# Patient Record
Sex: Female | Born: 1960 | Race: White | Hispanic: No | Smoking: Former smoker
Health system: Southern US, Community
[De-identification: ages and names within clinical notes are randomized; demographics above are authoritative.]

## PROBLEM LIST (undated history)

## (undated) DIAGNOSIS — H409 Unspecified glaucoma: Secondary | ICD-10-CM

## (undated) DIAGNOSIS — E785 Hyperlipidemia, unspecified: Secondary | ICD-10-CM

## (undated) DIAGNOSIS — E119 Type 2 diabetes mellitus without complications: Secondary | ICD-10-CM

## (undated) DIAGNOSIS — I10 Essential (primary) hypertension: Secondary | ICD-10-CM

## (undated) DIAGNOSIS — T7840XA Allergy, unspecified, initial encounter: Secondary | ICD-10-CM

## (undated) DIAGNOSIS — M1990 Unspecified osteoarthritis, unspecified site: Secondary | ICD-10-CM

## (undated) DIAGNOSIS — R569 Unspecified convulsions: Secondary | ICD-10-CM

## (undated) DIAGNOSIS — G4733 Obstructive sleep apnea (adult) (pediatric): Secondary | ICD-10-CM

## (undated) DIAGNOSIS — R011 Cardiac murmur, unspecified: Secondary | ICD-10-CM

## (undated) DIAGNOSIS — D649 Anemia, unspecified: Secondary | ICD-10-CM

## (undated) DIAGNOSIS — E079 Disorder of thyroid, unspecified: Secondary | ICD-10-CM

## (undated) DIAGNOSIS — G709 Myoneural disorder, unspecified: Secondary | ICD-10-CM

## (undated) DIAGNOSIS — N189 Chronic kidney disease, unspecified: Secondary | ICD-10-CM

## (undated) DIAGNOSIS — J45909 Unspecified asthma, uncomplicated: Secondary | ICD-10-CM

## (undated) HISTORY — DX: Allergy, unspecified, initial encounter: T78.40XA

## (undated) HISTORY — DX: Myoneural disorder, unspecified: G70.9

## (undated) HISTORY — DX: Hyperlipidemia, unspecified: E78.5

## (undated) HISTORY — DX: Cardiac murmur, unspecified: R01.1

## (undated) HISTORY — DX: Essential (primary) hypertension: I10

## (undated) HISTORY — DX: Anemia, unspecified: D64.9

## (undated) HISTORY — DX: Unspecified asthma, uncomplicated: J45.909

## (undated) HISTORY — DX: Chronic kidney disease, unspecified: N18.9

## (undated) HISTORY — DX: Obstructive sleep apnea (adult) (pediatric): G47.33

## (undated) HISTORY — DX: Unspecified osteoarthritis, unspecified site: M19.90

## (undated) HISTORY — DX: Unspecified convulsions: R56.9

## (undated) HISTORY — DX: Disorder of thyroid, unspecified: E07.9

## (undated) HISTORY — DX: Type 2 diabetes mellitus without complications: E11.9

## (undated) HISTORY — PX: COLONOSCOPY: SHX174

## (undated) HISTORY — DX: Unspecified glaucoma: H40.9

---
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
## 1998-09-21 ENCOUNTER — Encounter: Admission: RE | Admit: 1998-09-21 | Discharge: 1998-12-20 | Payer: Self-pay | Admitting: Unknown Physician Specialty

## 2006-11-11 DIAGNOSIS — G4733 Obstructive sleep apnea (adult) (pediatric): Secondary | ICD-10-CM

## 2006-11-11 HISTORY — DX: Obstructive sleep apnea (adult) (pediatric): G47.33

## 2008-07-23 ENCOUNTER — Emergency Department (HOSPITAL_BASED_OUTPATIENT_CLINIC_OR_DEPARTMENT_OTHER): Admission: EM | Admit: 2008-07-23 | Discharge: 2008-07-23 | Payer: Self-pay | Admitting: Emergency Medicine

## 2009-12-15 ENCOUNTER — Encounter: Admission: RE | Admit: 2009-12-15 | Discharge: 2009-12-15 | Payer: Self-pay | Admitting: Family Medicine

## 2010-01-03 ENCOUNTER — Other Ambulatory Visit: Admission: RE | Admit: 2010-01-03 | Discharge: 2010-01-03 | Payer: Self-pay | Admitting: Interventional Radiology

## 2010-01-03 ENCOUNTER — Encounter: Admission: RE | Admit: 2010-01-03 | Discharge: 2010-01-03 | Payer: Self-pay | Admitting: Family Medicine

## 2010-09-10 ENCOUNTER — Ambulatory Visit: Payer: Self-pay | Admitting: Internal Medicine

## 2010-09-10 DIAGNOSIS — F17200 Nicotine dependence, unspecified, uncomplicated: Secondary | ICD-10-CM | POA: Insufficient documentation

## 2010-09-10 DIAGNOSIS — E785 Hyperlipidemia, unspecified: Secondary | ICD-10-CM | POA: Insufficient documentation

## 2010-09-10 DIAGNOSIS — E663 Overweight: Secondary | ICD-10-CM | POA: Insufficient documentation

## 2010-09-10 DIAGNOSIS — G4730 Sleep apnea, unspecified: Secondary | ICD-10-CM | POA: Insufficient documentation

## 2010-09-10 DIAGNOSIS — R002 Palpitations: Secondary | ICD-10-CM | POA: Insufficient documentation

## 2010-09-10 DIAGNOSIS — R0609 Other forms of dyspnea: Secondary | ICD-10-CM | POA: Insufficient documentation

## 2010-09-10 DIAGNOSIS — R0989 Other specified symptoms and signs involving the circulatory and respiratory systems: Secondary | ICD-10-CM | POA: Insufficient documentation

## 2010-09-10 DIAGNOSIS — I10 Essential (primary) hypertension: Secondary | ICD-10-CM | POA: Insufficient documentation

## 2010-09-25 ENCOUNTER — Ambulatory Visit (HOSPITAL_COMMUNITY): Admission: RE | Admit: 2010-09-25 | Discharge: 2010-09-25 | Payer: Self-pay | Admitting: Internal Medicine

## 2010-09-25 ENCOUNTER — Telehealth (INDEPENDENT_AMBULATORY_CARE_PROVIDER_SITE_OTHER): Payer: Self-pay | Admitting: Radiology

## 2010-09-26 ENCOUNTER — Encounter: Payer: Self-pay | Admitting: Cardiovascular Disease

## 2010-09-26 ENCOUNTER — Ambulatory Visit: Payer: Self-pay | Admitting: Cardiovascular Disease

## 2010-09-26 ENCOUNTER — Ambulatory Visit (HOSPITAL_COMMUNITY): Admission: RE | Admit: 2010-09-26 | Discharge: 2010-09-26 | Payer: Self-pay | Admitting: Internal Medicine

## 2010-09-26 ENCOUNTER — Ambulatory Visit: Payer: Self-pay

## 2010-09-26 ENCOUNTER — Encounter (HOSPITAL_COMMUNITY)
Admission: RE | Admit: 2010-09-26 | Discharge: 2010-11-10 | Payer: Self-pay | Source: Home / Self Care | Attending: Internal Medicine | Admitting: Internal Medicine

## 2010-10-01 ENCOUNTER — Ambulatory Visit: Payer: Self-pay

## 2010-12-11 NOTE — Assessment & Plan Note (Signed)
Summary: Cardiology Nuclear Testing  Nuclear Med Background Indications for Stress Test: Evaluation for Ischemia   History: Asthma   Symptoms: Palpitations    Nuclear Pre-Procedure Cardiac Risk Factors: Family History - CAD, Hypertension, Lipids, NIDDM, Smoker Caffeine/Decaff Intake: None NPO After: 2:30 AM IV 0.9% NS with Angio Cath: 22g     IV Site: L Antecubital IV Started by: Irean Hong, RN Chest Size (in) 42     Cup Size C     Height (in): 63 Weight (lb): 235 BMI: 41.78  Nuclear Med Study 1 or 2 day study:  2 day     Stress Test Type:  Stress Reading MD:  Charlton Haws, MD     Referring MD:  P.Ross Resting Radionuclide:  Technetium 41m Tetrofosmin     Resting Radionuclide Dose:  33 mCi  Stress Radionuclide:  Technetium 34m Tetrofosmin     Stress Radionuclide Dose:  32 mCi   Stress Protocol Exercise Time (min):  4:00 min     Max HR:  157 bpm     Predicted Max HR:  169 bpm  Max Systolic BP: 190 mm Hg     Percent Max HR:  92.90 %     METS: 5.8 Rate Pressure Product:  81191    Stress Test Technologist:  Milana Na, EMT-P     Nuclear Technologist:  Doyne Keel, CNMT  Rest Procedure  Myocardial perfusion imaging was performed at rest 45 minutes following the intravenous administration of Technetium 68m Tetrofosmin.  Stress Procedure  The patient exercised for 4:00. The patient stopped due to fatigue and denied any chest pain.  There were no significant ST-T wave changes.  Technetium 31m Tetrofosmin was injected at peak exercise and myocardial perfusion imaging was performed after a brief delay.  QPS Raw Data Images:  Normal; no motion artifact; normal heart/lung ratio. Stress Images:  Normal homogeneous uptake in all areas of the myocardium. Rest Images:  Normal homogeneous uptake in all areas of the myocardium. Subtraction (SDS):  Normal Transient Ischemic Dilatation:  1.07  (Normal <1.22)  Lung/Heart Ratio:  0.42  (Normal <0.45)  Quantitative Gated Spect  Images QGS EDV:  66 ml QGS ESV:  21 ml QGS EF:  68 % QGS cine images:  normal  Findings Normal nuclear study      Overall Impression  Exercise Capacity: Poor exercise capacity. BP Response: Hypertensive blood pressure response. Clinical Symptoms: No chest pain ECG Impression: No significant ST segment change suggestive of ischemia. Overall Impression: Normal stress nuclear study.  Appended Document: Cardiology Nuclear Testing Normal stress nuclear study.  No evid to support coronary artery ischemia.  Appended Document: Cardiology Nuclear Testing [facility] with results.  Appended Document: Cardiology Nuclear Testing [facility] with results (private voice mail at work).

## 2010-12-11 NOTE — Assessment & Plan Note (Signed)
Summary: np6/palp-mb   Visit Type:  new pt Primary Provider:  Dr. Nadyne Coombes  CC:  swelling in legs.  History of Present Illness: patient is a 50 year old who was referred for evaluation of palpitatons. The patinet wa seen by Dr. Hal Hope in August.   A couple wks before this visit she notes the onset of palpittations.  She describes it as a "frog in her chest"   She has listened to her heart and says she hears it stop.  Events would occur randomly.  Not associated with dizziness.  Improved but hten last week she noted they came on again the patient is fairly active.  Walds dog a few times per week.  Noted a cough earlier in fall   Attrib to allergies.  With ithis would note occasional "pinch" in chest the was not assoc with actiity.  Come/go.  Not much now.  Current Medications (verified): 1)  Metformin Hcl 1000 Mg Tabs (Metformin Hcl) .... Two Times A Day 2)  Glucotrol 10 Mg Tabs (Glipizide) .... Two Times A Day 3)  Lisinopril 40 Mg Tabs (Lisinopril) .... Once Daily 4)  Pravastatin Sodium 40 Mg Tabs (Pravastatin Sodium) .... 2 Tab,  Two Times A Day 5)  Zyrtec Hives Relief 10 Mg Tabs (Cetirizine Hcl) .... Once Daily 6)  Multivitamins  Tabs (Multiple Vitamin) .... Once Daily 7)  Aspirin 81 Mg Tbec (Aspirin) .... Take One Tablet By Mouth Daily 8)  B Complex Formula 1  Tabs (Vitamins-Lipotropics) .... Once Daily  Allergies (verified): No Known Drug Allergies  Past History:  Past Medical History:  Asthma Arthritis Depression Diabetes Type 2  since 1996 Hypertension Dyslipidemia.  Past Surgical History:  cholecystectomy   Gall Bladder Removal  Family History: Reviewed history and no changes required. Motherr: Ovarian Cancer  Father:  CAD, CABG, AAA. Congestive Heart Failure  2 bro (twins):  One with hx MI in 57s. DIed of CA.  Other died of CHF. Nephew:  HTN, died of ruptured aorta.  Social History: Reviewed history from 08/22/2010 and no changes  required. non-drinker, No drugs 15 pyr.  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  Notes glucoses have been high.  Otherwise neg to the above problem excetp as noted above.  Vital Signs:  Patient profile:   50 year old female Height:      63 inches Weight:      241.50 pounds BMI:     42.93 Pulse rate:   92 / minute BP sitting:   128 / 60  (left arm)  Vitals Entered By: Caralee Ates CMA (September 10, 2010 8:50 AM)  Physical Exam  Additional Exam:  Patient is in NAD HEENT:  Normocephalic, atraumatic. EOMI, PERRLA.  Neck: JVP is normal. No thyromegaly. No bruits.  Lungs: clear to auscultation. No rales no wheezes.  Heart: Regular rate and rhythm. Normal S1, S2. No S3.   No significant murmurs. PMI not displaced.  Abdomen:  Supple, nontender. Normal bowel sounds. No masses. No hepatomegaly.  Extremities:   Good distal pulses throughout. No lower extremity edema.  Musculoskeletal :moving all extremities.  Neuro:   alert and oriented x3.    EKG  Procedure date:  09/10/2010  Findings:      NSR.  86 bpm.  Impression & Recommendations:  Problem # 1:  PALPITATIONS (ICD-785.1) Patients spells sound like isolated skips.  Not hemodyn destabilizing.  Actually improved.  I would follow.  Wiht history though (strong) of CAD, CHF would rec echo and myoview (esp given pts own risks) to evaluate further.  Problem # 2:  HYPERLIPIDEMIA-MIXED (ICD-272.4) Will get results  On unusual regimen.   Her updated medication list for this problem includes:    Pravastatin Sodium 40 Mg Tabs (Pravastatin sodium) .Marland Kitchen... 2 tab,  two times a day  Problem # 3:  HYPERTENSION, BENIGN (ICD-401.1) Adequate control Her updated medication list for this problem includes:    Lisinopril 40 Mg Tabs (Lisinopril) ..... Once daily    Aspirin 81 Mg Tbec (Aspirin) .Marland Kitchen... Take one tablet by mouth daily  Problem # 4:  OVERWEIGHT/OBESITY  (ICD-278.02) Counselled on exercise, wt loss.    Problem # 5:  SLEEP APNEA (ICD-780.57) If she indeed has I should be treated.  Problem # 6:  TOBACCO ABUSE (ICD-305.1) Counselled.  patient is trying to quit.  Other Orders: Echocardiogram (Echo) Nuclear Stress Test (Nuc Stress Test)  Patient Instructions: 1)  Your physician has requested that you have an echocardiogram.  Echocardiography is a painless test that uses sound waves to create images of your heart. It provides your doctor with information about the size and shape of your heart and how well your heart's chambers and valves are working.  This procedure takes approximately one hour. There are no restrictions for this procedure. 2)  Your physician has requested that you have an exercise stress myoview.  For further information please visit [email or web address]  Please follow instruction sheet, as given. 3)  Your physician recommends that you schedule a follow-up appointment as needed with Dr Tenny Craw.    Appended Document: np6/palp-mb Pt with history of thyroid nodules.  ON exam  thyroid is prominent.  No palp nodules.  She reprts thyroid values were normal earlier this year.

## 2010-12-11 NOTE — Progress Notes (Signed)
Summary: nu8c pre-procedure  Phone Note Outgoing Call   Call placed by: Domenic Polite, CNMT,  September 25, 2010 12:06 PM Call placed to: Patient Reason for Call: Confirm/change Appt Summary of Call: Left message with information on Myoview Information Sheet (see scanned document for details).  Initial call taken by: Domenic Polite, CNMT,  September 25, 2010 12:06 PM     Nuclear Med Background Indications for Stress Test: Evaluation for Ischemia   History: Asthma   Symptoms: Palpitations    Nuclear Pre-Procedure Cardiac Risk Factors: Family History - CAD, Hypertension, Lipids, NIDDM, Smoker Height (in): 63

## 2011-11-18 ENCOUNTER — Ambulatory Visit: Payer: Self-pay | Admitting: Family Medicine

## 2011-11-25 ENCOUNTER — Ambulatory Visit (INDEPENDENT_AMBULATORY_CARE_PROVIDER_SITE_OTHER): Payer: BC Managed Care – PPO | Admitting: Family Medicine

## 2011-11-25 DIAGNOSIS — E785 Hyperlipidemia, unspecified: Secondary | ICD-10-CM

## 2011-11-25 DIAGNOSIS — Z23 Encounter for immunization: Secondary | ICD-10-CM

## 2011-11-25 DIAGNOSIS — I10 Essential (primary) hypertension: Secondary | ICD-10-CM

## 2011-11-25 DIAGNOSIS — E119 Type 2 diabetes mellitus without complications: Secondary | ICD-10-CM

## 2011-11-26 ENCOUNTER — Encounter: Payer: Self-pay | Admitting: Family Medicine

## 2011-11-26 DIAGNOSIS — M109 Gout, unspecified: Secondary | ICD-10-CM | POA: Insufficient documentation

## 2011-11-26 DIAGNOSIS — IMO0001 Reserved for inherently not codable concepts without codable children: Secondary | ICD-10-CM

## 2011-11-26 DIAGNOSIS — E042 Nontoxic multinodular goiter: Secondary | ICD-10-CM | POA: Insufficient documentation

## 2011-11-26 DIAGNOSIS — E785 Hyperlipidemia, unspecified: Secondary | ICD-10-CM

## 2012-12-03 ENCOUNTER — Telehealth: Payer: Self-pay | Admitting: Radiology

## 2012-12-03 NOTE — Telephone Encounter (Signed)
Lantus 50 units qd, losartin, metformin and crestor rx requested from [facility]. Patient has 1 month supply she picked up today, they are requesting refills, advised patient due for follow up. [facility] advised they will call her to advise her of need for office visit.

## 2012-12-31 ENCOUNTER — Ambulatory Visit (INDEPENDENT_AMBULATORY_CARE_PROVIDER_SITE_OTHER): Payer: BC Managed Care – PPO | Admitting: Family Medicine

## 2012-12-31 ENCOUNTER — Encounter: Payer: Self-pay | Admitting: Family Medicine

## 2012-12-31 VITALS — BP 131/80 | HR 102 | Temp 98.9°F | Resp 17 | Ht 62.0 in | Wt 224.0 lb

## 2012-12-31 DIAGNOSIS — M25519 Pain in unspecified shoulder: Secondary | ICD-10-CM

## 2012-12-31 DIAGNOSIS — E1149 Type 2 diabetes mellitus with other diabetic neurological complication: Secondary | ICD-10-CM

## 2012-12-31 DIAGNOSIS — I10 Essential (primary) hypertension: Secondary | ICD-10-CM

## 2012-12-31 DIAGNOSIS — E042 Nontoxic multinodular goiter: Secondary | ICD-10-CM

## 2012-12-31 LAB — COMPREHENSIVE METABOLIC PANEL
BUN: 13 mg/dL (ref 6–23)
CO2: 26 mEq/L (ref 19–32)
Creat: 0.84 mg/dL (ref 0.50–1.10)
Potassium: 4.2 mEq/L (ref 3.5–5.3)
Sodium: 139 mEq/L (ref 135–145)

## 2012-12-31 LAB — LIPID PANEL
HDL: 29 mg/dL — ABNORMAL LOW (ref >39)
LDL Cholesterol: 53 mg/dL (ref 0–99)
Total CHOL/HDL Ratio: 3.6 Ratio

## 2012-12-31 MED ORDER — DICLOFENAC SODIUM 75 MG PO TBEC: 75.0000 mg | DELAYED_RELEASE_TABLET | Freq: Two times a day (BID) | ORAL | Status: DC

## 2012-12-31 MED ORDER — INSULIN LISPRO PROT & LISPRO (50-50 MIX) 100 UNIT/ML ~~LOC~~ SUSP: SUBCUTANEOUS | Status: DC

## 2012-12-31 MED ORDER — LOSARTAN POTASSIUM 100 MG PO TABS: 100.0000 mg | ORAL_TABLET | Freq: Every day | ORAL | Status: DC

## 2012-12-31 MED ORDER — METFORMIN HCL 1000 MG PO TABS: 1000.0000 mg | ORAL_TABLET | Freq: Two times a day (BID) | ORAL | Status: DC

## 2012-12-31 MED ORDER — ROSUVASTATIN CALCIUM 10 MG PO TABS: 10.0000 mg | ORAL_TABLET | Freq: Every day | ORAL | Status: DC

## 2012-12-31 MED ORDER — INSULIN GLARGINE 100 UNIT/ML ~~LOC~~ SOLN: 50.0000 [IU] | Freq: Two times a day (BID) | SUBCUTANEOUS | Status: DC

## 2012-12-31 NOTE — Patient Instructions (Addendum)
For joint pain, try Arnica gel which you can apply 2-3 times a day to unbroken dry skin.

## 2012-12-31 NOTE — Progress Notes (Signed)
S:  Madison 52 y.o. Cauc Cowan was last here in Jan 2013. She has Type II DM (A1c= 11.2%) w/ mild peripheral neuropathy, HTN and obesity (Metabolic Syndrome). FSBS checked 2-3 times/week; average values mid-100s. Denies symptoms of hypoglycemia. Not following a meal plan due to hectic work schedule (w/ [employer] program) and long hours; often skips meals. No regular exercise but did walk dogs for 15 minutes x 2 yesterday. Has problems w/ sciatica and does not do stretching exercises.  Ophthalmology exam yearly.  Re: HTN- well controlled on current meds; pt is compliant and w/o adverse effects.   Pt c/o L shoulder pain; she has hx of L elbow tendinitis but now has pain around shoulder w/ restricted ROM and pain along back of arm. Madison occurs with certain movements ( reaching in front or up above shoulder level).  ROS: As per HPI; negative for fatigue, neck pain or stiffness, CP or tightness, palpitations, SOB or DOE, cough, HA, dizziness, numbness, weakness or syncope. She denies any psychological problems.  O:  Filed Vitals:   12/31/12 1524  BP: 131/80  Pulse: 102  Temp: 98.9 F (37.2 C)  Resp: 17   GEN: In NAD; WN,WD. Pt is obese. HENT: [place]/AT; EOMI w/ clear conj/ sclerae. EACs normal. Oroph clear and moist. NECK: Supple; nontender. No LAN. Enlarged thyroid with palpable nodule on R. COR: RRR. LUNGS: CTA; poor inspiratory effort. No wheezes or rales. MS: L shoulder w/ decreased ROM in anterior plane and abduction above 90 degrees. Tender supraspinatous muscle. EXT: Foot exam- normal skin with intact TD/DP pulses- 1+. No ulcers. Sensations diminished. NEURO: A&O x 3; CNs intact. DTRs  0-1+/= in UEs and LEs. Nonfocal.   Results for orders placed in visit on 12/31/12  POCT GLYCOSYLATED HEMOGLOBIN (HGB A1C)      Result Value Range   Hemoglobin A1C 9.2      A/P:  DM (diabetes mellitus) type II controlled, neurological manifestation -  Start to follow a meal plan and avoid skipping meals.   Plan: Refills medications, Amb ref to Medical Nutrition Therapy-MNT. CMET and Lipids.  Multiple thyroid nodules - stable.          Plan: TSH, T3, Free  HTN (hypertension) - well controlled and stable.      Plan: CMET.  Severe obesity (BMI >= 40) - pt to increase activity and improve nutrition Plan: TSH, Amb ref to Medical Nutrition Therapy-MNT  Pain in joint, shoulder region- suspect overuse syndrome.  RX: Diclofenac, Topical analgesic.   Meds ordered Madison encounter  Medications  . DISCONTD: losartan (COZAAR) 100 MG tablet    Sig: Take 100 mg by mouth daily.  Marland Kitchen DISCONTD: insulin lispro protamine-insulin lispro (HUMALOG MIX 50/50 PEN) (50-50) 100 UNIT/ML SUSP    Sig: Inject 10 Units into the skin 2 (two) times daily before a meal. Pre meal  . albuterol (PROVENTIL HFA;VENTOLIN HFA) 108 (90 BASE) MCG/ACT inhaler    Sig: Inhale 2 puffs into the lungs every 6 (six) hours as needed for wheezing.  . Multiple Vitamins-Minerals (MULTIVITAMIN WITH MINERALS) tablet    Sig: Take 1 tablet by mouth daily.  Marland Kitchen b complex vitamins tablet    Sig: Take 1 tablet by mouth daily.  . Ascorbic Acid (VITAMIN C) 100 MG tablet    Sig: Take 100 mg by mouth daily.  . calcium-vitamin D (OSCAL WITH D) 500-200 MG-UNIT per tablet    Sig: Take 1 tablet by mouth daily.  Marland Kitchen co-enzyme Q-10 30 MG capsule  Sig: Take 30 mg by mouth 3 (three) times daily.  . diclofenac (VOLTAREN) 75 MG EC tablet    Sig: Take 1 tablet (75 mg total) by mouth 2 (two) times daily. Take all doses with food or snack.    Dispense:  60 tablet    Refill:  1  . rosuvastatin (CRESTOR) 10 MG tablet    Sig: Take 1 tablet (10 mg total) by mouth daily.    Dispense:  90 tablet    Refill:  3  . metFORMIN (GLUCOPHAGE) 1000 MG tablet    Sig: Take 1 tablet (1,000 mg total) by mouth 2 (two) times daily.    Dispense:  180 tablet    Refill:  3  . losartan (COZAAR) 100 MG tablet    Sig: Take 1 tablet (100 mg total) by mouth daily.    Dispense:  90 tablet     Refill:  3  . insulin lispro protamine-insulin lispro (HUMALOG MIX 50/50 PEN) (50-50) 100 UNIT/ML SUSP    Sig: Inject 10 units under skin immediately before main meals.    Dispense:  10 mL    Refill:  5  . insulin glargine (LANTUS) 100 UNIT/ML injection    Sig: Inject 50 Units into the skin 2 (two) times daily.    Dispense:  3 pen    Refill:  5

## 2013-01-01 LAB — TSH: TSH: 1.318 u[IU]/mL (ref 0.350–4.500)

## 2013-01-02 ENCOUNTER — Encounter: Payer: Self-pay | Admitting: Family Medicine

## 2013-01-27 ENCOUNTER — Encounter: Payer: Self-pay | Admitting: Family Medicine

## 2013-02-02 ENCOUNTER — Ambulatory Visit: Payer: Self-pay | Admitting: *Deleted

## 2013-02-08 ENCOUNTER — Other Ambulatory Visit: Payer: Self-pay | Admitting: *Deleted

## 2013-02-08 MED ORDER — INSULIN LISPRO 100 UNIT/ML ~~LOC~~ SOLN: SUBCUTANEOUS | Status: DC

## 2013-04-01 ENCOUNTER — Ambulatory Visit: Payer: BC Managed Care – PPO | Admitting: Family Medicine

## 2013-04-09 ENCOUNTER — Other Ambulatory Visit: Payer: Self-pay | Admitting: Family Medicine

## 2013-04-28 ENCOUNTER — Ambulatory Visit (INDEPENDENT_AMBULATORY_CARE_PROVIDER_SITE_OTHER): Payer: BC Managed Care – PPO | Admitting: Family Medicine

## 2013-04-28 ENCOUNTER — Encounter: Payer: Self-pay | Admitting: Family Medicine

## 2013-04-28 VITALS — BP 145/83 | HR 101 | Temp 98.3°F | Resp 18 | Ht 62.75 in | Wt 218.0 lb

## 2013-04-28 DIAGNOSIS — E669 Obesity, unspecified: Secondary | ICD-10-CM

## 2013-04-28 DIAGNOSIS — E786 Lipoprotein deficiency: Secondary | ICD-10-CM

## 2013-04-28 DIAGNOSIS — I10 Essential (primary) hypertension: Secondary | ICD-10-CM

## 2013-04-28 DIAGNOSIS — IMO0001 Reserved for inherently not codable concepts without codable children: Secondary | ICD-10-CM

## 2013-04-28 MED ORDER — INSULIN SYRINGES (DISPOSABLE) U-100 1 ML MISC: 10.0000 [IU] | Freq: Three times a day (TID) | Status: DC

## 2013-04-28 MED ORDER — INSULIN ASPART 100 UNIT/ML ~~LOC~~ SOLN: 10.0000 [IU] | Freq: Three times a day (TID) | SUBCUTANEOUS | Status: DC

## 2013-04-28 NOTE — Progress Notes (Signed)
S:  This 52 y.o. Cauc female has Type II DM and HTN; she is compliant w/ medications and reports no hypoglycemic episodes. She does not check FSBS as often as she should but, now that school is out foe the summer, her daily routine will be more consistent. Nutrition has suffered due to long work hours and she admits poor food choices. Weight is down 6 pounds and pt will be more active during the summer months.  Insurance formulary is changing so current Humalog Insulin in the [place] will no longer be available.   PMHx, Soc Hx and Problem List reviewed.  ROS: Negative for diaphoresis, fever/chills, abnormal weight or appetite change, vision changes, CP or tightness, palpitations, edema, SOB or DOE, cough, GI problems, polydipsia or polyphagia, myalgias, HA, dizziness, numbness, weakness  or syncope.  OCeasar Mons Vitals:   04/28/13 0950  BP: 145/83                   Weight down 6 pounds since Feb 2014.         BP @ 10:20 AM: 140/86  Pulse: 101  Temp: 98.3 F (36.8 C)  Resp: 18   GEN: In NAD; WN,WD. HENT: [place]/AT; wears corrective lenses. EOMI w/ clear conj/sclerae. Otherwise unremarkable. COR: RRR. LUNGS: Normal resp rate and effort. SKIN: W&D; see DM foot exam. MS: MAEs; no joint deformities or decreased ROM. NEURO: A&O x 3; CNs intact. Nonfocal.  Results for orders placed in visit on 04/28/13  GLUCOSE, POCT (MANUAL RESULT ENTRY)      Result Value Range   POC Glucose 220 (*) 70 - 99 mg/dl  POCT GLYCOSYLATED HEMOGLOBIN (HGB A1C)      Result Value Range   Hemoglobin A1C 8.8     A/P: Type II or unspecified type diabetes mellitus without mention of complication, uncontrolled - Plan: POCT glucose (manual entry), POCT glycosylated hemoglobin (Hb A1C)  Continue Lantus 50 units bid. RX: Novolog 10-15 units subq tid before meals. Focus on portion control and regular physical activity for weight loss. Check FSBS 2-3 times daily.  HTN, goal below 140/80- stable on current medication; weight  reduction encouraged as well as home monitoring.  Obesity, unspecified  Low HDL (under 40)- will recheck labs at next visit.

## 2013-04-28 NOTE — Patient Instructions (Addendum)
I have changed your Insulin to Novolog; the dose is 10-15 units injected before meals, three times a day.  Try to stay active this summer and make healthy food choices and monitor portion sizes. I will see you again in ~ 4 months. Labs will be repeated at that visit.

## 2013-05-13 ENCOUNTER — Ambulatory Visit (INDEPENDENT_AMBULATORY_CARE_PROVIDER_SITE_OTHER): Payer: BC Managed Care – PPO | Admitting: Family Medicine

## 2013-05-13 VITALS — BP 140/82 | HR 103 | Temp 98.7°F | Resp 18 | Ht 62.5 in | Wt 218.0 lb

## 2013-05-13 DIAGNOSIS — R109 Unspecified abdominal pain: Secondary | ICD-10-CM

## 2013-05-13 DIAGNOSIS — IMO0001 Reserved for inherently not codable concepts without codable children: Secondary | ICD-10-CM

## 2013-05-13 DIAGNOSIS — R300 Dysuria: Secondary | ICD-10-CM

## 2013-05-13 LAB — POCT CBC
Granulocyte percent: 85.2 %G — AB (ref 37–80)
HCT, POC: 46.6 % (ref 37.7–47.9)
Hemoglobin: 14.9 g/dL (ref 12.2–16.2)
MCH, POC: 28.6 pg (ref 27–31.2)
MCV: 89.4 fL (ref 80–97)
POC Granulocyte: 13.9 — AB (ref 2–6.9)
POC MID %: 3.4 %M (ref 0–12)
RBC: 5.21 M/uL (ref 4.04–5.48)

## 2013-05-13 LAB — POCT URINALYSIS DIPSTICK
Bilirubin, UA: NEGATIVE
Glucose, UA: 500
Leukocytes, UA: NEGATIVE
Protein, UA: NEGATIVE
pH, UA: 5.5

## 2013-05-13 LAB — POCT UA - MICROSCOPIC ONLY: Crystals, Ur, HPF, POC: NEGATIVE

## 2013-05-13 MED ORDER — FLUCONAZOLE 150 MG PO TABS: 150.0000 mg | ORAL_TABLET | Freq: Once | ORAL | Status: DC

## 2013-05-13 MED ORDER — CIPROFLOXACIN HCL 500 MG PO TABS: 500.0000 mg | ORAL_TABLET | Freq: Two times a day (BID) | ORAL | Status: DC

## 2013-05-13 NOTE — Patient Instructions (Addendum)
1.  Return for worsening abdominal pain. 2.  Drink lots of water for the next week. 3.  Check sugars regularly with illness.

## 2013-05-13 NOTE — Progress Notes (Signed)
[address]   [place], [place]  [address]   [phone]  Subjective:    Patient ID: Madison Cowan, female    DOB: September 06, 1961, 52 y.o.   MRN: [id number]  HPI This 52 y.o. female presents for evaluation of abdominal pain, constipation, bloating, sweats, chills, bladder pain, lower back pain, urinary urgency, frequency, dysuria.  Nauseated with vomiting this morning.  Later, working at [employer], gagged.   +Urinary incontinence with vomiting.  No food intake today; getting hugnry now.  No diarrhea; +constipation for the past three days.  No bloody or melanotic stools.  Passing flatus well last night.  Frequent indigestion today.  Has drunk water and soda today.  Abdominal pain now a little better.  No history of UTI.  Divorced; not dating ;not sexually active.  No vaginal discharge; no vaginal burning/itching/irritation.  No history of colonoscopy.  No previous diverticulitis.   Review of Systems  Constitutional: Positive for chills and diaphoresis. Negative for fever and fatigue.  Respiratory: Negative for shortness of breath.   Cardiovascular: Negative for chest pain.  Gastrointestinal: Positive for nausea, vomiting and constipation. Negative for abdominal pain and diarrhea.  Genitourinary: Positive for dysuria, urgency, frequency and flank pain. Negative for hematuria, vaginal bleeding, vaginal discharge, genital sores, vaginal pain and pelvic pain.    Past Medical History  Diagnosis Date  . Diabetes mellitus without complication   . Hypertension   . Asthma   . Arthritis   . Allergy     Past Surgical History  Procedure Laterality Date  . Cholecystectomy open  1979    Prior to Admission medications   Medication Sig Start Date End Date Taking? Authorizing Provider  albuterol (PROVENTIL HFA;VENTOLIN HFA) 108 (90 BASE) MCG/ACT inhaler Inhale 2 puffs into the lungs every 6 (six) hours as needed for wheezing.   Yes Historical Provider, MD  Ascorbic Acid (VITAMIN C) 100 MG tablet Take  100 mg by mouth daily.   Yes Historical Provider, MD  aspirin 81 MG tablet Take 160 mg by mouth daily.   Yes Historical Provider, MD  b complex vitamins tablet Take 1 tablet by mouth daily.   Yes Historical Provider, MD  calcium-vitamin D (OSCAL WITH D) 500-200 MG-UNIT per tablet Take 1 tablet by mouth daily.   Yes Historical Provider, MD  co-enzyme Q-10 30 MG capsule Take 30 mg by mouth 3 (three) times daily.   Yes Historical Provider, MD  diclofenac (VOLTAREN) 75 MG EC tablet Take 1 tablet (75 mg total) by mouth 2 (two) times daily. Take all doses with food or snack. 12/31/12  Yes Maurice March, MD  insulin aspart (NOVOLOG) 100 UNIT/ML injection Inject 10-15 Units into the skin 3 (three) times daily before meals. 04/28/13  Yes Maurice March, MD  insulin glargine (LANTUS) 100 UNIT/ML injection Inject 50 Units into the skin 2 (two) times daily. 12/31/12  Yes Maurice March, MD  Insulin Syringes, Disposable, U-100 1 ML MISC 10-15 Units by Does not apply route 3 (three) times daily before meals. 04/28/13  Yes Maurice March, MD  losartan (COZAAR) 100 MG tablet Take 1 tablet (100 mg total) by mouth daily. 12/31/12  Yes Maurice March, MD  metFORMIN (GLUCOPHAGE) 1000 MG tablet Take 1 tablet (1,000 mg total) by mouth 2 (two) times daily. 12/31/12  Yes Maurice March, MD  BD PEN NEEDLE NANO U/F 32G X 4 MM MISC USE AS DIRECTED 04/09/13   Sondra Barges, PA-C  cetirizine (ZYRTEC) 10 MG  tablet Take 10 mg by mouth daily.    Historical Provider, MD  Multiple Vitamins-Minerals (MULTIVITAMIN WITH MINERALS) tablet Take 1 tablet by mouth daily.    Historical Provider, MD  rosuvastatin (CRESTOR) 10 MG tablet Take 1 tablet (10 mg total) by mouth daily. 12/31/12   Maurice March, MD    No Known Allergies  History   Social History  . Marital Status: Divorced    Spouse Name: N/A    Number of Children: N/A  . Years of Education: N/A   Occupational History  . Not on file.   Social  History Main Topics  . Smoking status: Current Every Day Smoker -- 0.05 packs/day  . Smokeless tobacco: Never Used  . Alcohol Use: No  . Drug Use: No  . Sexually Active: Not on file   Other Topics Concern  . Not on file   Social History Narrative  . No narrative on file    Family History  Problem Relation Age of Onset  . Cancer Mother   . Diabetes Mother   . Heart disease Father   . Diabetes Father        Objective:   Physical Exam  Nursing note and vitals reviewed. Constitutional: She is oriented to person, place, and time. She appears well-developed and well-nourished. No distress.  HENT:  Mouth/Throat: Oropharynx is clear and moist.  Eyes: Conjunctivae are normal. Pupils are equal, round, and reactive to light.  Cardiovascular: Normal rate, regular rhythm and normal heart sounds.  Exam reveals no gallop.   No murmur heard. Pulmonary/Chest: Effort normal and breath sounds normal. She has no wheezes. She has no rales.  Abdominal: Soft. Bowel sounds are normal. She exhibits no distension. There is tenderness in the right lower quadrant. There is no rebound and no guarding.  Neurological: She is alert and oriented to person, place, and time.  Skin: Skin is warm and dry. No rash noted. She is not diaphoretic.  Psychiatric: She has a normal mood and affect. Her behavior is normal.   Results for orders placed in visit on 05/13/13  POCT CBC      Result Value Range   WBC 16.3 (*) 4.6 - 10.2 K/uL   Lymph, poc 1.9  0.6 - 3.4   POC LYMPH PERCENT 11.4  10 - 50 %L   MID (cbc) 0.6  0 - 0.9   POC MID % 3.4  0 - 12 %M   POC Granulocyte 13.9 (*) 2 - 6.9   Granulocyte percent 85.2 (*) 37 - 80 %G   RBC 5.21  4.04 - 5.48 M/uL   Hemoglobin 14.9  12.2 - 16.2 g/dL   HCT, POC 40.9  81.1 - 47.9 %   MCV 89.4  80 - 97 fL   MCH, POC 28.6  27 - 31.2 pg   MCHC 32.0  31.8 - 35.4 g/dL   RDW, POC 91.4     Platelet Count, POC 291  142 - 424 K/uL   MPV 8.7  0 - 99.8 fL  GLUCOSE, POCT (MANUAL  RESULT ENTRY)      Result Value Range   POC Glucose 220 (*) 70 - 99 mg/dl  POCT UA - MICROSCOPIC ONLY      Result Value Range   WBC, Ur, HPF, POC 6-18     RBC, urine, microscopic 0-4     Bacteria, U Microscopic 2+     Mucus, UA mod     Epithelial cells, urine per micros 0-3  Crystals, Ur, HPF, POC neg     Casts, Ur, LPF, POC neg     Yeast, UA neg    POCT URINALYSIS DIPSTICK      Result Value Range   Color, UA yellow     Clarity, UA cloudy     Glucose, UA 500     Bilirubin, UA neg     Ketones, UA trace     Spec Grav, UA 1.010     Blood, UA large     pH, UA 5.5     Protein, UA neg     Urobilinogen, UA 0.2     Nitrite, UA neg     Leukocytes, UA Negative         Assessment & Plan:  Dysuria - Plan: POCT UA - Microscopic Only, POCT urinalysis dipstick, Urine culture  Abdominal pain, unspecified site - Plan: POCT CBC  Type II or unspecified type diabetes mellitus without mention of complication, uncontrolled - Plan: POCT glucose (manual entry)  1.  Dysuria:  New.  Send urine culture.  Treat with Cipro.  2.  Abdominal pain generalized B lower quadrants:  New. Associated with urinary symptoms.   Improved from earlier this morning; more tender in RLQ on exam.  Monitor abdominal pain over the next 48-72 hours; ddx includes diverticulitis, appendicitis early.   3.  DMII: uncontrolled; advised to check sugars daily while acutely ill.    Meds ordered this encounter  Medications  . ciprofloxacin (CIPRO) 500 MG tablet    Sig: Take 1 tablet (500 mg total) by mouth 2 (two) times daily.    Dispense:  20 tablet    Refill:  0  . fluconazole (DIFLUCAN) 150 MG tablet    Sig: Take 1 tablet (150 mg total) by mouth once. Repeat if needed    Dispense:  2 tablet    Refill:  0

## 2013-05-15 LAB — URINE CULTURE

## 2013-05-19 ENCOUNTER — Encounter: Payer: Self-pay | Admitting: Family Medicine

## 2013-09-01 ENCOUNTER — Encounter: Payer: Self-pay | Admitting: Family Medicine

## 2013-09-01 ENCOUNTER — Ambulatory Visit (INDEPENDENT_AMBULATORY_CARE_PROVIDER_SITE_OTHER): Payer: BC Managed Care – PPO | Admitting: Family Medicine

## 2013-09-01 VITALS — BP 132/80 | HR 90 | Temp 98.6°F | Resp 16 | Ht 62.5 in | Wt 218.2 lb

## 2013-09-01 DIAGNOSIS — E1149 Type 2 diabetes mellitus with other diabetic neurological complication: Secondary | ICD-10-CM

## 2013-09-01 DIAGNOSIS — I10 Essential (primary) hypertension: Secondary | ICD-10-CM

## 2013-09-01 DIAGNOSIS — N949 Unspecified condition associated with female genital organs and menstrual cycle: Secondary | ICD-10-CM

## 2013-09-01 DIAGNOSIS — R102 Pelvic and perineal pain: Secondary | ICD-10-CM

## 2013-09-01 DIAGNOSIS — Z23 Encounter for immunization: Secondary | ICD-10-CM

## 2013-09-01 MED ORDER — INSULIN GLARGINE 100 UNIT/ML ~~LOC~~ SOLN: 50.0000 [IU] | Freq: Two times a day (BID) | SUBCUTANEOUS | Status: DC

## 2013-09-01 NOTE — Progress Notes (Signed)
S:  This 52 y.o. Cauc female has Type II DM and well controlled HTN. FSBS= 110-180, occasional 200. Not able to eat at consistent times, skips breakfast and sometimes forgets mealtime Insulin and a dose of Metformin. She is walking her dog daily. Extended work hours are stressful. Meal breaks at work are rushed.  HTN- medication compliance great; no medication side effects. Pt denies CP or tightness, palpitations, cough, SOB or DOE, edema, HA, dizziness, lightheadedness, numbness, weakness or syncope. Working on weight loss w/ goal of 200 pounds.  Pelvic pain- recent visit for mild pelvic discomfort and dysuria in July 2014. This symptom along w/ bloating is present most days of the week. Pelvic/PAP > 4 years ago. Pt denies change in stools, hematuria, urgency or frequency.  Patient Active Problem List   Diagnosis Date Noted  . DM (diabetes mellitus) type II controlled, neurological manifestation 12/31/2012  . Dyslipidemia 11/26/2011  . Multinodular goiter 11/26/2011  . Gout 11/26/2011  . HYPERLIPIDEMIA-MIXED 09/10/2010  . OVERWEIGHT/OBESITY 09/10/2010  . TOBACCO ABUSE 09/10/2010  . HYPERTENSION, BENIGN 09/10/2010  . SLEEP APNEA 09/10/2010  . PALPITATIONS 09/10/2010  . PND 09/10/2010   PMHx, Soc Hx and Fam Hx reviewed. Medications reconciled.  ROS: As per HPI.  O: Filed Vitals:   09/01/13 0956  BP: 132/80  Pulse: 90  Temp: 98.6 F (37 C)  Resp: 16   GEN: In NAD; WN,WD. Weight down 6 lbs since Feb 2014. HENT: [place]/AT; EOMI w/ clear conj/sclerae. EACs/nose/oroph unremarkable. COR: RRR. No edema. LUNGS: Normal resp rate and effort. SKIN: W&D; intact w/o erythema, rashes, lesions or pallor. MS: MAEs; no c/c/e. See DM foot exam. NEURO: A&O x 3; CNs intact. Nonfocal.  Results for orders placed in visit on 09/01/13  POCT GLYCOSYLATED HEMOGLOBIN (HGB A1C)      Result Value Range   Hemoglobin A1C 9.0      A/P: DM (diabetes mellitus) type II controlled, neurological manifestation  - Emphasized routine meals and compliance w/ medications as prescribed. Make time for meals and snacks. Plan: POCT glycosylated hemoglobin (Hb A1C)  HYPERTENSION, BENIGN- Stable on current medications; no change in therapy. Encouraged weight loss.  Pelvic pain in female- Return in 1 month for pelvic exam/PAP  Need for prophylactic vaccination and inoculation against influenza - Plan: Flu Vaccine QUAD 36+ mos IM  Meds ordered this encounter  Medications  . insulin glargine (LANTUS) 100 UNIT/ML injection    Sig: Inject 0.5 mLs (50 Units total) into the skin 2 (two) times daily.    Dispense:  3 pen    Refill:  5

## 2013-09-01 NOTE — Patient Instructions (Signed)
Your A1c =9%.  Goal is to get this number down to 7- 7.5%  Take all medications without missing doses. Take time to eat routine meals and stay active. I will get you back  In for pelvic exam and PAP next month.

## 2013-09-14 ENCOUNTER — Other Ambulatory Visit: Payer: Self-pay | Admitting: Family Medicine

## 2013-09-29 ENCOUNTER — Ambulatory Visit (INDEPENDENT_AMBULATORY_CARE_PROVIDER_SITE_OTHER): Payer: BC Managed Care – PPO | Admitting: Family Medicine

## 2013-09-29 ENCOUNTER — Encounter: Payer: Self-pay | Admitting: Family Medicine

## 2013-09-29 VITALS — BP 160/74 | HR 105 | Temp 98.5°F | Resp 16 | Ht 62.0 in | Wt 218.2 lb

## 2013-09-29 DIAGNOSIS — Z01419 Encounter for gynecological examination (general) (routine) without abnormal findings: Secondary | ICD-10-CM

## 2013-09-29 DIAGNOSIS — Z124 Encounter for screening for malignant neoplasm of cervix: Secondary | ICD-10-CM

## 2013-09-29 DIAGNOSIS — Z1211 Encounter for screening for malignant neoplasm of colon: Secondary | ICD-10-CM

## 2013-09-29 MED ORDER — METFORMIN HCL 1000 MG PO TABS: 1000.0000 mg | ORAL_TABLET | Freq: Two times a day (BID) | ORAL | Status: DC

## 2013-09-29 MED ORDER — ROSUVASTATIN CALCIUM 10 MG PO TABS: 10.0000 mg | ORAL_TABLET | Freq: Every day | ORAL | Status: DC

## 2013-09-29 MED ORDER — LOSARTAN POTASSIUM 100 MG PO TABS: 100.0000 mg | ORAL_TABLET | Freq: Every day | ORAL | Status: DC

## 2013-09-29 NOTE — Patient Instructions (Signed)
You can use MyChart to send the name of the facility in [place] where you want mammogram scheduled. You also need to be referred to a GI specialist to discuss having screening colonoscopy done.

## 2013-09-29 NOTE — Progress Notes (Signed)
S:  This 52 y.o. Cauc female is here for PAP and pelvic exam. She has occasional mild suprapubic "cramping" but no urinary tract symptoms and is menopausal. Her mother has ovarian CA so pt has some concerns about this. She is overdue for colonoscopy and mammogram. Last mammogram was performed in [place]; pt will send that information to this office via MyChart so that mammogram can be scheduled.  Re: DM- last A1c= 9.0% in Oct 2014. Pt will return for DM follow-up in 3 months. Labs will be performed at that time.  Note: pt reports that she received Pneumococcal vaccine >5 years ago when she was a pt with  Dr. Sonia Baller. (There is no record of it in the paper record that we have; we have no records from Dr. Lillie Fragmin office).  Patient Active Problem List   Diagnosis Date Noted  . DM (diabetes mellitus) type II controlled, neurological manifestation 12/31/2012  . Multinodular goiter 11/26/2011  . Gout 11/26/2011  . HYPERLIPIDEMIA-MIXED 09/10/2010  . OVERWEIGHT/OBESITY 09/10/2010  . TOBACCO ABUSE 09/10/2010  . HYPERTENSION, BENIGN 09/10/2010  . SLEEP APNEA 09/10/2010  . PALPITATIONS 09/10/2010  . PND 09/10/2010   PMHx and Surg Hx, Soc Hx and Fam Hx reviewed.  Medications reconciled.  ROS: As per HPI.  O: Filed Vitals:   09/29/13 1055  BP: 160/74  Pulse: 105  Temp: 98.5 F (36.9 C)  Resp: 16   GEN: in NAD; WN,WD. HENT: [place]/AT; EOMI w/ clear conj/sclerae. EACs/nose/orooph unremarkable. COR: RRR. LUNGS: Normal resp rate and effort. ABD: Obese w/o guarding or distention; no appreciable HSM or masses. Minimal suprapubic tenderness but large pannus noted w/ striae. GU: NEFG; vaginal mucosa erythematous. No discharge. Cervix normal- PAP specimen obtained. Bimanual- No adnexal masses or tenderness; uterus not felt- difficult to palpate due to adipose tissue. Rectal- Normal appearance and tone; no masses or bleeding noted. Hemosure negative. NEURO: A&O x 3; CNs intact.  Nonfocal.  Results for orders placed in visit on 09/29/13  IFOBT (OCCULT BLOOD)      Result Value Range   IFOBT Negative       A/P: Encounter for cervical Pap smear with pelvic exam - Plan: Pap IG w/ reflex to HPV when ASC-U Pt had last mmg in [place]; she will send the info about where to schedule this study.  Screening for colon cancer - Recommended CRS with GI specialist; this has been scheduled and cancelled by pt in the past.         Plan: IFOBT POC (occult bld, rslt in office   Meds ordered this encounter  Medications  . losartan (COZAAR) 100 MG tablet    Sig: Take 1 tablet (100 mg total) by mouth daily.    Dispense:  30 tablet    Refill:  5  . metFORMIN (GLUCOPHAGE) 1000 MG tablet    Sig: Take 1 tablet (1,000 mg total) by mouth 2 (two) times daily.    Dispense:  60 tablet    Refill:  5  . rosuvastatin (CRESTOR) 10 MG tablet    Sig: Take 1 tablet (10 mg total) by mouth daily.    Dispense:  30 tablet    Refill:  5

## 2013-09-30 LAB — PAP IG W/ RFLX HPV ASCU

## 2013-10-01 ENCOUNTER — Encounter: Payer: Self-pay | Admitting: Family Medicine

## 2013-12-03 ENCOUNTER — Other Ambulatory Visit: Payer: Self-pay

## 2013-12-03 MED ORDER — ALBUTEROL SULFATE HFA 108 (90 BASE) MCG/ACT IN AERS: 2.0000 | INHALATION_SPRAY | Freq: Four times a day (QID) | RESPIRATORY_TRACT | Status: DC | PRN

## 2013-12-03 NOTE — Telephone Encounter (Signed)
Dr Leward Quan, do you want to RF pt's Proair? I don't see that this med was specifically discussed. Rx'd previously by Dr Darron Doom.

## 2013-12-03 NOTE — Telephone Encounter (Signed)
ProAir refill signed.

## 2014-01-05 ENCOUNTER — Ambulatory Visit (INDEPENDENT_AMBULATORY_CARE_PROVIDER_SITE_OTHER): Payer: BC Managed Care – PPO | Admitting: Family Medicine

## 2014-01-05 ENCOUNTER — Encounter: Payer: Self-pay | Admitting: Family Medicine

## 2014-01-05 ENCOUNTER — Ambulatory Visit: Payer: BC Managed Care – PPO

## 2014-01-05 VITALS — BP 143/81 | HR 94 | Temp 98.9°F | Resp 16 | Ht 62.5 in | Wt 212.0 lb

## 2014-01-05 DIAGNOSIS — M79609 Pain in unspecified limb: Secondary | ICD-10-CM

## 2014-01-05 DIAGNOSIS — M79645 Pain in left finger(s): Secondary | ICD-10-CM

## 2014-01-05 DIAGNOSIS — Z1159 Encounter for screening for other viral diseases: Secondary | ICD-10-CM

## 2014-01-05 DIAGNOSIS — E785 Hyperlipidemia, unspecified: Secondary | ICD-10-CM

## 2014-01-05 DIAGNOSIS — E1149 Type 2 diabetes mellitus with other diabetic neurological complication: Secondary | ICD-10-CM

## 2014-01-05 DIAGNOSIS — E663 Overweight: Secondary | ICD-10-CM

## 2014-01-05 LAB — POCT GLYCOSYLATED HEMOGLOBIN (HGB A1C): Hemoglobin A1C: 7.9

## 2014-01-05 NOTE — Patient Instructions (Signed)
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 These changes are likely what is contributing to joint pain. Try tart cherry juice and topical analgesic like Arnica Gel or cream, Tiger Balm or some other over-the-counter pain relief product.  Continue to address dietary changes that you can make for better control of Diabetes and for weight loss.   How to Increase Fiber in the Meal Plan for Diabetes Increasing fiber in the diet has many benefits including lowering blood cholesterol, helping to control blood glucose (sugar), preventing constipation, and aiding in weight management by helping you feel full longer. Start adding fiber to your diet slowly. A gradual substitution of high-fiber foods for low-fiber foods will allow the digestive tract to adjust. Most men under 33 years of age should aim to eat 38 g of fiber a day. Women should aim for 25 g. Over 4 years of age, most men need 30 g of fiber and most women need 21 g. Below are some suggestions for increasing fiber.  Try whole-wheat bread instead of white bread. Look for words high on the list of ingredients, such as whole wheat, whole rye, or whole oats.  Try a baked potato with skin instead of mashed potatoes.  Try a fresh apple with skin instead of applesauce.  Try a fresh orange instead of orange juice.  Try popcorn instead of potato chips.  Try bran cereal instead of corn flakes.  Try kidney, whole pinto, or garbanzo beans instead of bread.  Try whole-grain crackers instead of saltine crackers.  Try whole-wheat pasta instead of regular varieties. While on a high-fiber diet:   Drink enough water and fluids to keep your urine clear or pale yellow.  Eat a variety of high fiber foods such as fruits, vegetables, whole grains, nuts, and seeds.  Aim for 5 servings of fruit and vegetables per day.  Try to increase your intake of fiber by eating high-fiber foods instead of taking fiber  supplements that contain small amounts of fiber. There can be additional benefits for long-term health and blood glucose control with high-fiber foods.  SOURCES OF FIBER The following list shows the average dietary fiber for types of food in the various food groups. Starches and Breads / Dietary Fiber (g)  Whole-grain bread, 1 slice / 2 g  Whole-grain cereals,  cup / 3 g  Bran cereals,  to  cup / 8 g  Starchy vegetables,  cup / 3 g  Oatmeal,  cup / 2 g  Whole-wheat pasta,  cup / 2 g  Brown rice,  cup / 2 g  Barley,  cup / 3 g Legumes / Dietary Fiber (g)  Beans,  cup / 8 g  Peas,  cup / 8 g  Lentils ,  cup / 8 g Meat and Meat Substitutes / Dietary Fiber (g) This group averages 0 grams of fiber. Exceptions are:  Nuts, seeds, 1 oz or  cup / 3 g  Chunky peanut butter, 2 tbs / 3 g Vegetables / Dietary Fiber (g)  Cooked vegetables,  to  cup / 2 to 3 g  Raw vegetables, 1 to 2 cups / 2 to 3 g Fruit / Dietary Fiber (g)  Raw or cooked fruit,  cup or 1 small, fresh piece / 2 g Milk / Dietary Fiber (g)  Milk, 1 cup or 8 oz / 0 g Fats and Oils / Dietary Fiber (g)  Fats and oils, 1 tsp / 0 g  You can determine how much fiber you are eating by reading the Nutrition Facts panel on the labels of the foods you eat. FIBER IN SPECIFIC FOODS Cereals / Dietary Fiber (g)  All Bran,  cup / 9 g  All Bran with Extra Fiber,  cup / 13 g  Bran Flakes,  cup / 4 g  Cheerios,  cup / 1.5 g  Corn Bran,  cup / 4 g  Corn Flakes,  cup / 0.75 g  Cracklin' Oat Bran,  cup / 4 g  Fiber One,  cup / 13 g  Grape Nuts, 3 tbs / 3 g  Grape Nuts Flakes,  cup / 3 g  Noodles,  cup, cooked / 0.5 g  Nutrigrain Wheat,  cup / 3.5 g  Oatmeal,  cup, cooked / 1.1 g  Pasta, white (macaroni, spaghetti),  cup, cooked / 0.5 g  Pasta, whole-wheat (macaroni, spaghetti),  cup, cooked / 2 g  [place],  cup, cooked / 3 g  Rice, wild,  cup, cooked / 0.5 g  Rice,  brown,  cup, cooked / 1 g  Rice, white,  cup, cooked / 0.2 g  Shredded Wheat, bite-sized,  cup / 2 g  Total,  cup / 1.75 g  Wheat Chex,  cup / 2.5 g  [place],  cup, cooked / 4 g  Wheaties,  cup / 2.75 g Bread, Starchy Vegetables, and Dried Peas and Beans / Dietary Fiber (g)  Bagel, whole / 0.6 g  Baked beans in tomato sauce,  cup, cooked / 3 g  Bran muffin, 1 small / 2.5 g  Bread, cracked wheat, 1 slice / 2.5 g  Bread, pumpernickel, 1 slice / 2.5 g  Bread, white, 1 slice / 0.4 g  Bread, whole-wheat, 1 slice / 1.4 g  Corn,  cup, canned / 2.9 g  Kidney beans,  cup, cooked / 3.5 g  Lentils, cup, cooked / 3 g  Lima beans,  cup, cooked / 4 g  Navy beans,  cup, cooked / 4 g  Peas,  cup, cooked / 4 g  Popcorn, 3 cups popped, unbuttered / 3.5 g  Potato, baked (with skin), 1 small / 4 g  Potato, baked (without skin), 1 small / 2 g  Ry-Krisp, 4 crackers / 3 g  Saltine crackers, 6 squares / 0 g  Split peas,  cup, cooked / 2.5 g  Yams (sweet potato),  cup / 1.7 g Fruit / Dietary Fiber (g)  Apple, 1 small, fresh, with skin / 4 g  Apple juice,  cup / 0.4 g  Apricots, 4 medium, fresh / 4 g  Apricots, 7 halves, dried / 2 g  Banana,  medium / 1.2 g  Blueberries,  cup / 2 g  Cantaloupe, melon / 1.3 g  Cherries,  cup, canned / 1.4 g  Grapefruit,  medium / 1.6 g  Grapes, 15 small / 1.2 g  Grape juice,  cup / 0.5 g  Orange, 1 medium, fresh / 2 g  Orange juice,  cup / 0.5 g  Peach, 1 medium,fresh, with skin / 2 g  Pear, 1 medium, fresh, with skin / 4 g  Pineapple, cup, canned / 0.7 g  Plums, 2 whole / 2 g  Prunes, 3 whole / 1.5 g  Raspberries, 1 cup / 6 g  Strawberries, 1  cup / 4 g  Watermelon, 1  cup / 0.5 g Vegetables / Dietary Fiber (g)  Asparagus,  cup, cooked / 1  g  Beans, green and wax,  cup, cooked / 1.6 g  Beets,  cup, cooked / 1.8 g  Broccoli,  cup, cooked / 2.2 g  Brussels sprouts,  cup,  cooked / 4 g  Cabbage,  cup, cooked / 2.5 g  Carrots,  cup, cooked / 2.3 g  Cauliflower,  cup, cooked / 1.1 g  Celery, 1 cup, raw / 1.5 g  Cucumber, 1 cup, raw / 0.8 g  Green pepper,  cup sliced, cooked / 1.5 g  Lettuce, 1 cup, sliced / 0.9 g  Mushrooms, 1 cup sliced, raw / 1.8 g  Onion, 1 cup sliced, raw / 1.6 g  Spinach,  cup, cooked / 2.4 g  Tomato, 1 medium, fresh / 1.5 g  Tomato juice,  cup / 0 g  Zucchini,  cup, cooked / 1.8 g Document Released: 04/19/2002 Document Revised: 02/22/2013 Document Reviewed: 05/16/2009 Exit[employer] Patient Information 2014 [place], [place].

## 2014-01-05 NOTE — Progress Notes (Signed)
S:  This 53 y.o. Cauc female is here for DM follow-up; FSBS= 80-150. She occasionally has BS= 200+ before meals; she uses mealtime Insulin as makes adjustments as needed. Basal Insulin dose is 50 units bid. Weight loss accomplished by eating healthier (reducing carbs) and limiting sugar intake. She does not drink diet sodas or use artificial sweeteners; Stevia-in-the-Raw is her substitute.  Pt c/o L thumb pain and stiff; distal joint sticks sometimes and she has discomfort in web space between thumb and 1st finger. Pt is R-handed and some typing is associated w/ her job. She denies trauma.   Patient Active Problem List   Diagnosis Date Noted  . DM (diabetes mellitus) type II controlled, neurological manifestation 12/31/2012  . Multinodular goiter 11/26/2011  . Gout 11/26/2011  . HYPERLIPIDEMIA-MIXED 09/10/2010  . OVERWEIGHT/OBESITY 09/10/2010  . TOBACCO ABUSE 09/10/2010  . HYPERTENSION, BENIGN 09/10/2010  . SLEEP APNEA 09/10/2010  . PALPITATIONS 09/10/2010  . PND 09/10/2010   Prior to Admission medications   Medication Sig Start Date End Date Taking? Authorizing Provider  albuterol (PROVENTIL HFA;VENTOLIN HFA) 108 (90 BASE) MCG/ACT inhaler Inhale 2 puffs into the lungs every 6 (six) hours as needed for wheezing. 12/03/13  Yes Barton Fanny, MD  Ascorbic Acid (VITAMIN C) 100 MG tablet Take 100 mg by mouth daily.   Yes Historical Provider, MD  aspirin 81 MG tablet Take 160 mg by mouth daily.   Yes Historical Provider, MD  b complex vitamins tablet Take 1 tablet by mouth daily.   Yes Historical Provider, MD  BD PEN NEEDLE NANO U/F 32G X 4 MM MISC USE AS DIRECTED 04/09/13  Yes Ryan M Dunn, PA-C  cetirizine (ZYRTEC) 10 MG tablet Take 10 mg by mouth daily.   Yes Historical Provider, MD  glucose blood (ONE TOUCH ULTRA TEST) test strip 1 each by Other route as needed for other. Use as instructed   Yes Historical Provider, MD  insulin aspart (NOVOLOG) 100 UNIT/ML injection Inject 10-15 Units  into the skin 3 (three) times daily before meals. 04/28/13  Yes Barton Fanny, MD  Insulin Syringes, Disposable, U-100 1 ML MISC 10-15 Units by Does not apply route 3 (three) times daily before meals. 04/28/13  Yes Barton Fanny, MD  LANTUS SOLOSTAR 100 UNIT/ML SOPN INJECT 50 UNITS INTO THE SKIN 2 (TWO) TIMES DAILY. 09/14/13  Yes Barton Fanny, MD  losartan (COZAAR) 100 MG tablet Take 1 tablet (100 mg total) by mouth daily. 09/29/13  Yes Barton Fanny, MD  metFORMIN (GLUCOPHAGE) 1000 MG tablet Take 1 tablet (1,000 mg total) by mouth 2 (two) times daily. 09/29/13  Yes Barton Fanny, MD  Multiple Vitamins-Minerals (MULTIVITAMIN WITH MINERALS) tablet Take 1 tablet by mouth daily.   Yes Historical Provider, MD  rosuvastatin (CRESTOR) 10 MG tablet Take 1 tablet (10 mg total) by mouth daily. 09/29/13  Yes Barton Fanny, MD  calcium-vitamin D (OSCAL WITH D) 500-200 MG-UNIT per tablet Take 1 tablet by mouth daily.    Historical Provider, MD  co-enzyme Q-10 30 MG capsule Take 30 mg by mouth 3 (three) times daily.    Historical Provider, MD   ROS: Negative for fatigue, abnormal appetite or weight loss/gain; vision disturbances, CP or tightness, palpitations, edema, SOB or DOE, cough, GI problems, skin or hair changes, HA, dizziness, asymmetric numbness or weakness or syncope. No behavior or mental health abnormalities.    O: Filed Vitals:   01/05/14 1027  BP: 143/81  Pulse: 94  Temp:  98.9 F (37.2 C)  Resp: 16   GEN: In NAD; WN,WD. Pt is obese. HENT: [place]/AT; EOMI w/ clear conj/sclerae. EACs/TMs normal w/ normal hearing. Nasal mucosa normal w/o rhinorrhea or erythema. Good dentition. Post ph mildly erythematous w/o swelling or lesions. NECK: Supple w/ diffuse but mild enlargement of thyroid gland. No LAN. COR: RRR. Normal S1 and S2. No m/g/r. LUNGS: CTA; normal resp and effort. SKIN: W&D; intact w/o diaphoresis, erythema or pallor. MS: L hand- thenar eminence mildly  tender as well as PIP and DIP joints. Normal ROM. NO swelling or redness. No triggering today. Otherwise unremarkable. NEURO: A&O x 3; CNs intact. Gait normal. DTRs- 1-2+/=. See  DM Foot exam.  Nonfocal.   Results for orders placed in visit on 01/05/14  POCT GLYCOSYLATED HEMOGLOBIN (HGB A1C)      Result Value Ref Range   Hemoglobin A1C 7.9      [facility] reading (PRIMARY) by  Dr. Leward Quan: L Thumb- Early degenerative changes and sesamoid bones. No fracture or dislocation.   A/P: DM (diabetes mellitus) type II controlled, neurological manifestation - Continue current medications. Advised weight loss w/ better lifestyle practices. 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   Plan: DG Finger Thumb Left  HYPERLIPIDEMIA-MIXED - Plan: COMPLETE METABOLIC PANEL WITH GFR, Lipid panel  OVERWEIGHT/OBESITY - Commended on 6-lb weight reduction. Plan: COMPLETE METABOLIC PANEL WITH GFR, Lipid panel  Need for hepatitis C screening test - Plan: Hepatitis C antibody

## 2014-01-06 LAB — COMPLETE METABOLIC PANEL WITH GFR
ALT: 24 U/L (ref 0–35)
AST: 24 U/L (ref 0–37)
Albumin: 4.3 g/dL (ref 3.5–5.2)
Alkaline Phosphatase: 59 U/L (ref 39–117)
BUN: 14 mg/dL (ref 6–23)
CALCIUM: 9.7 mg/dL (ref 8.4–10.5)
CHLORIDE: 103 meq/L (ref 96–112)
CO2: 25 meq/L (ref 19–32)
Creat: 0.82 mg/dL (ref 0.50–1.10)
GFR, Est Non African American: 81 mL/min
Glucose, Bld: 108 mg/dL — ABNORMAL HIGH (ref 70–99)
Potassium: 4.4 mEq/L (ref 3.5–5.3)
Sodium: 138 mEq/L (ref 135–145)
Total Bilirubin: 0.6 mg/dL (ref 0.2–1.2)
Total Protein: 6.9 g/dL (ref 6.0–8.3)

## 2014-01-06 LAB — LIPID PANEL
CHOLESTEROL: 104 mg/dL (ref 0–200)
HDL: 32 mg/dL — ABNORMAL LOW (ref >39)
LDL Cholesterol: 48 mg/dL (ref 0–99)
Total CHOL/HDL Ratio: 3.3 Ratio
Triglycerides: 119 mg/dL (ref <150)
VLDL: 24 mg/dL (ref 0–40)

## 2014-01-06 LAB — HEPATITIS C ANTIBODY: HCV Ab: NEGATIVE

## 2014-01-07 ENCOUNTER — Encounter: Payer: Self-pay | Admitting: Family Medicine

## 2014-01-27 ENCOUNTER — Other Ambulatory Visit: Payer: Self-pay | Admitting: Family Medicine

## 2014-02-27 ENCOUNTER — Other Ambulatory Visit: Payer: Self-pay | Admitting: Family Medicine

## 2014-03-28 ENCOUNTER — Other Ambulatory Visit: Payer: Self-pay | Admitting: Family Medicine

## 2014-05-06 ENCOUNTER — Ambulatory Visit (INDEPENDENT_AMBULATORY_CARE_PROVIDER_SITE_OTHER): Payer: BC Managed Care – PPO | Admitting: Family Medicine

## 2014-05-06 ENCOUNTER — Encounter: Payer: Self-pay | Admitting: Family Medicine

## 2014-05-06 VITALS — BP 137/74 | HR 91 | Temp 98.7°F | Resp 18 | Ht 62.0 in | Wt 215.0 lb

## 2014-05-06 DIAGNOSIS — M4316 Spondylolisthesis, lumbar region: Secondary | ICD-10-CM

## 2014-05-06 DIAGNOSIS — Q762 Congenital spondylolisthesis: Secondary | ICD-10-CM

## 2014-05-06 DIAGNOSIS — E1149 Type 2 diabetes mellitus with other diabetic neurological complication: Secondary | ICD-10-CM

## 2014-05-06 DIAGNOSIS — I10 Essential (primary) hypertension: Secondary | ICD-10-CM

## 2014-05-06 DIAGNOSIS — E663 Overweight: Secondary | ICD-10-CM

## 2014-05-06 LAB — POCT GLYCOSYLATED HEMOGLOBIN (HGB A1C): HEMOGLOBIN A1C: 8.2

## 2014-05-06 MED ORDER — INSULIN SYRINGES (DISPOSABLE) U-100 1 ML MISC: 10.0000 [IU] | Freq: Three times a day (TID) | Status: DC

## 2014-05-06 MED ORDER — INSULIN GLARGINE 100 UNIT/ML SOLOSTAR PEN: PEN_INJECTOR | SUBCUTANEOUS | Status: DC

## 2014-05-06 MED ORDER — METFORMIN HCL 1000 MG PO TABS: 1000.0000 mg | ORAL_TABLET | Freq: Two times a day (BID) | ORAL | Status: DC

## 2014-05-06 MED ORDER — LOSARTAN POTASSIUM 100 MG PO TABS: 100.0000 mg | ORAL_TABLET | Freq: Every day | ORAL | Status: DC

## 2014-05-06 MED ORDER — INSULIN PEN NEEDLE 32G X 4 MM MISC: Status: DC

## 2014-05-06 MED ORDER — TRAMADOL HCL 50 MG PO TABS: 50.0000 mg | ORAL_TABLET | Freq: Three times a day (TID) | ORAL | Status: DC | PRN

## 2014-05-06 MED ORDER — INSULIN ASPART 100 UNIT/ML ~~LOC~~ SOLN: 10.0000 [IU] | Freq: Three times a day (TID) | SUBCUTANEOUS | Status: DC

## 2014-05-06 MED ORDER — ROSUVASTATIN CALCIUM 10 MG PO TABS: 10.0000 mg | ORAL_TABLET | Freq: Every day | ORAL | Status: DC

## 2014-05-06 NOTE — Progress Notes (Signed)
Subjective:    Patient ID: Madison Cowan, female    DOB: 1961-05-12, 53 y.o.   MRN: [id number]  HPI  This 53 y.o. Cauc female has uncontrolled Type II DM. Las A1c= 7.9% in Feb 2015. 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 She checks FSBS several times a week w/ average values 150-200. She denies hypoglycemia. Work schedule interferes w/ regular mealtimes and healthy snacks. She has not found time for regular exercise. Compliance with medications is good though she occasionally misses evening dose of mealtime Insulin. No adverse medication reactions reported.  Pt has chronic LBP due to lumbar DDD; she has spondylolisthesis and this has flared up recently, limiting physical activity. She occasionally does stretching exercises. Oral analgesics provide temporary relief. She has not had severe back pain w/ limb numbness or  saddle anesthesia.  Pt voices frustration re: obesity. She plans to work on improving nutrition and walking more this summer. Other forms of exercise are available to her; she and her sister will explore aquatic exercise at the [facility].  Patient Active Problem List   Diagnosis Date Noted  . Spondylolisthesis at L4-L5 level 05/06/2014  . DM (diabetes mellitus) type II controlled, neurological manifestation 12/31/2012  . Multinodular goiter 11/26/2011  . Gout 11/26/2011  . HYPERLIPIDEMIA-MIXED 09/10/2010  . OVERWEIGHT/OBESITY 09/10/2010  . TOBACCO ABUSE 09/10/2010  . HYPERTENSION, BENIGN 09/10/2010  . SLEEP APNEA 09/10/2010  . PALPITATIONS 09/10/2010  . PND 09/10/2010   Prior to Admission medications   Medication Sig Start Date End Date Taking? Authorizing Provider  albuterol (PROVENTIL HFA;VENTOLIN HFA) 108 (90 BASE) MCG/ACT inhaler Inhale 2 puffs into the lungs every 6 (six) hours as needed for wheezing. 12/03/13  Yes Barton Fanny, MD  Ascorbic Acid (VITAMIN C) 100 MG tablet Take 100 mg by mouth daily.   Yes  Historical Provider, MD  aspirin 325 MG EC tablet Take 325 mg by mouth daily.   Yes Historical Provider, MD  b complex vitamins tablet Take 1 tablet by mouth daily.   Yes Historical Provider, MD  cetirizine (ZYRTEC) 10 MG tablet Take 10 mg by mouth daily.   Yes Historical Provider, MD  CRESTOR 10 MG tablet TAKE 1 TABLET (10 MG TOTAL) BY MOUTH DAILY.   Yes Barton Fanny, MD  insulin aspart (NOVOLOG) 100 UNIT/ML injection Inject 10-15 Units into the skin 3 (three) times daily before meals.   Yes Barton Fanny, MD  Insulin Glargine (LANTUS SOLOSTAR) 100 UNIT/ML Solostar Pen INJECT 50 UNITS INTO THE SKIN 2 (TWO) TIMES DAILY.   Yes Barton Fanny, MD  Insulin Pen Needle (BD PEN NEEDLE NANO U/F) 32G X 4 MM MISC USE AS DIRECTED   Yes Barton Fanny, MD  Insulin Syringes, Disposable, U-100 1 ML MISC 10-15 Units by Does not apply route 3 (three) times daily before meals.   Yes Barton Fanny, MD  losartan (COZAAR) 100 MG tablet Take 1 tablet (100 mg total) by mouth daily.   Yes Barton Fanny, MD  metFORMIN (GLUCOPHAGE) 1000 MG tablet Take 1 tablet (1,000 mg total) by mouth 2 (two) times daily.   Yes Barton Fanny, MD  Multiple Vitamins-Minerals (MULTIVITAMIN WITH MINERALS) tablet Take 1 tablet by mouth daily.   Yes Historical Provider, MD  ONE TOUCH ULTRA TEST test strip TESTING TWICE DAILY   Yes Barton Fanny, MD  rosuvastatin (CRESTOR) 10 MG tablet Take 1 tablet (10 mg total) by mouth daily.  Yes Barton Fanny, MD  calcium-vitamin D (OSCAL WITH D) 500-200 MG-UNIT per tablet Take 1 tablet by mouth daily.    Historical Provider, MD  co-enzyme Q-10 30 MG capsule Take 30 mg by mouth 3 (three) times daily.    Historical Provider, MD  traMADol (ULTRAM) 50 MG tablet Take 1 tablet (50 mg total) by mouth every 8 (eight) hours as needed.    Barton Fanny, MD   PMHx, Surg Hx, Soc and Fam HX reviewed.   Review of Systems  Constitutional: Negative.   Eyes:  Negative for visual disturbance.  Respiratory: Negative for cough, chest tightness and shortness of breath.   Cardiovascular: Negative.   Endocrine: Negative.   Musculoskeletal: Positive for arthralgias and back pain. Negative for gait problem, joint swelling and myalgias.  Neurological: Negative for dizziness, syncope, weakness, light-headedness, numbness and headaches.  Psychiatric/Behavioral: Negative.       Objective:   Physical Exam  Nursing note and vitals reviewed. Constitutional: She is oriented to person, place, and time. She appears well-developed and well-nourished. No distress.  Weight is up 3 lbs.  HENT:  Head: Normocephalic and atraumatic.  Right Ear: External ear normal.  Left Ear: External ear normal.  Nose: Nose normal.  Mouth/Throat: Oropharynx is clear and moist.  Eyes: Conjunctivae and EOM are normal. Pupils are equal, round, and reactive to light. No scleral icterus.  Neck: Normal range of motion. Neck supple. No thyromegaly present.  Cardiovascular: Normal rate and regular rhythm.   Pulmonary/Chest: Effort normal. No respiratory distress.  Abdominal: Soft. She exhibits no distension. There is no tenderness.  Musculoskeletal: Normal range of motion. She exhibits no edema.       Cervical back: Normal.       Thoracic back: Normal.       Lumbar back: She exhibits tenderness and spasm. She exhibits no deformity.  Neurological: She is alert and oriented to person, place, and time. No cranial nerve deficit. She exhibits normal muscle tone. Coordination normal.  Skin: Skin is warm and dry. She is not diaphoretic. No pallor.  Psychiatric: She has a normal mood and affect. Her behavior is normal. Judgment and thought content normal.    Results for orders placed in visit on 05/06/14  POCT GLYCOSYLATED HEMOGLOBIN (HGB A1C)      Result Value Ref Range   Hemoglobin A1C 8.2        Assessment & Plan:  DM (diabetes mellitus) type II controlled, neurological manifestation  - Continue current medications; encouraged meal planning/ improved nutrition and exercise at least 3 tiems per week. Continue to monitor FSBS most days of the week. Aim for 1/2 lb per week of weight loss. Plan: POCT glycosylated hemoglobin (Hb A1C)  HYPERTENSION, BENIGN- Stable on current medications.  Spondylolisthesis at L4-L5 level- Monitor; refill Tramadol.  OVERWEIGHT/OBESITY- Offered DM education and referral to RD; pt declines.  Meds ordered this encounter  Medications  . aspirin 325 MG EC tablet    Sig: Take 325 mg by mouth daily.  . insulin aspart (NOVOLOG) 100 UNIT/ML injection    Sig: Inject 10-15 Units into the skin 3 (three) times daily before meals.    Dispense:  3 vial    Refill:  11  . Insulin Syringes, Disposable, U-100 1 ML MISC    Sig: 10-15 Units by Does not apply route 3 (three) times daily before meals.    Dispense:  100 each    Refill:  11  . Insulin Glargine (LANTUS SOLOSTAR) 100 UNIT/ML  Solostar Pen    Sig: INJECT 50 UNITS INTO THE SKIN 2 (TWO) TIMES DAILY.    Dispense:  30 mL    Refill:  11  . losartan (COZAAR) 100 MG tablet    Sig: Take 1 tablet (100 mg total) by mouth daily.    Dispense:  30 tablet    Refill:  11  . metFORMIN (GLUCOPHAGE) 1000 MG tablet    Sig: Take 1 tablet (1,000 mg total) by mouth 2 (two) times daily.    Dispense:  60 tablet    Refill:  11  . rosuvastatin (CRESTOR) 10 MG tablet    Sig: Take 1 tablet (10 mg total) by mouth daily.    Dispense:  30 tablet    Refill:  11  . Insulin Pen Needle (BD PEN NEEDLE NANO U/F) 32G X 4 MM MISC    Sig: USE AS DIRECTED    Dispense:  100 each    Refill:  11  . traMADol (ULTRAM) 50 MG tablet    Sig: Take 1 tablet (50 mg total) by mouth every 8 (eight) hours as needed.    Dispense:  40 tablet    Refill:  0

## 2014-06-29 ENCOUNTER — Telehealth: Payer: Self-pay | Admitting: *Deleted

## 2014-06-29 NOTE — Telephone Encounter (Signed)
Called and left voicemail on home phone for Madison Cowan to set up an appointment for an 3 month diabetes check-up.

## 2014-07-27 ENCOUNTER — Other Ambulatory Visit: Payer: Self-pay | Admitting: Family Medicine

## 2014-09-15 ENCOUNTER — Ambulatory Visit: Payer: BC Managed Care – PPO | Admitting: Family Medicine

## 2014-10-05 ENCOUNTER — Ambulatory Visit (INDEPENDENT_AMBULATORY_CARE_PROVIDER_SITE_OTHER): Payer: BC Managed Care – PPO | Admitting: Family Medicine

## 2014-10-05 ENCOUNTER — Encounter: Payer: Self-pay | Admitting: Family Medicine

## 2014-10-05 ENCOUNTER — Other Ambulatory Visit: Payer: Self-pay | Admitting: Physician Assistant

## 2014-10-05 VITALS — BP 128/70 | HR 105 | Temp 98.6°F | Resp 16 | Ht 62.5 in | Wt 216.4 lb

## 2014-10-05 DIAGNOSIS — E119 Type 2 diabetes mellitus without complications: Secondary | ICD-10-CM

## 2014-10-05 DIAGNOSIS — E785 Hyperlipidemia, unspecified: Secondary | ICD-10-CM

## 2014-10-05 DIAGNOSIS — Z716 Tobacco abuse counseling: Secondary | ICD-10-CM

## 2014-10-05 DIAGNOSIS — I10 Essential (primary) hypertension: Secondary | ICD-10-CM

## 2014-10-05 DIAGNOSIS — E1149 Type 2 diabetes mellitus with other diabetic neurological complication: Secondary | ICD-10-CM

## 2014-10-05 DIAGNOSIS — Z720 Tobacco use: Secondary | ICD-10-CM

## 2014-10-05 DIAGNOSIS — Z23 Encounter for immunization: Secondary | ICD-10-CM

## 2014-10-05 LAB — CBC WITH DIFFERENTIAL/PLATELET
BASOS PCT: 0 % (ref 0–1)
Basophils Absolute: 0 10*3/uL (ref 0.0–0.1)
EOS ABS: 0.2 10*3/uL (ref 0.0–0.7)
Eosinophils Relative: 2 % (ref 0–5)
HEMATOCRIT: 42.7 % (ref 36.0–46.0)
Hemoglobin: 14.3 g/dL (ref 12.0–15.0)
Lymphocytes Relative: 31 % (ref 12–46)
Lymphs Abs: 3.8 10*3/uL (ref 0.7–4.0)
MCH: 28.5 pg (ref 26.0–34.0)
MCHC: 33.5 g/dL (ref 30.0–36.0)
MCV: 85.2 fL (ref 78.0–100.0)
MONOS PCT: 6 % (ref 3–12)
MPV: 10.9 fL (ref 9.4–12.4)
Monocytes Absolute: 0.7 10*3/uL (ref 0.1–1.0)
NEUTROS PCT: 61 % (ref 43–77)
Neutro Abs: 7.4 10*3/uL (ref 1.7–7.7)
Platelets: 255 10*3/uL (ref 150–400)
RBC: 5.01 MIL/uL (ref 3.87–5.11)
RDW: 13.6 % (ref 11.5–15.5)
WBC: 12.1 10*3/uL — ABNORMAL HIGH (ref 4.0–10.5)

## 2014-10-05 LAB — POCT URINALYSIS DIPSTICK
BILIRUBIN UA: NEGATIVE
Blood, UA: NEGATIVE
Glucose, UA: NEGATIVE
Leukocytes, UA: NEGATIVE
NITRITE UA: NEGATIVE
PH UA: 5
PROTEIN UA: NEGATIVE
Spec Grav, UA: 1.015
Urobilinogen, UA: 0.2

## 2014-10-05 LAB — COMPLETE METABOLIC PANEL WITH GFR
ALT: 28 U/L (ref 0–35)
AST: 22 U/L (ref 0–37)
Albumin: 4.1 g/dL (ref 3.5–5.2)
Alkaline Phosphatase: 59 U/L (ref 39–117)
BUN: 16 mg/dL (ref 6–23)
CO2: 27 meq/L (ref 19–32)
Calcium: 9.5 mg/dL (ref 8.4–10.5)
Chloride: 102 mEq/L (ref 96–112)
Creat: 0.97 mg/dL (ref 0.50–1.10)
GFR, EST AFRICAN AMERICAN: 76 mL/min
GFR, EST NON AFRICAN AMERICAN: 66 mL/min
GLUCOSE: 210 mg/dL — AB (ref 70–99)
Potassium: 4.4 mEq/L (ref 3.5–5.3)
Sodium: 138 mEq/L (ref 135–145)
Total Bilirubin: 0.5 mg/dL (ref 0.2–1.2)
Total Protein: 6.9 g/dL (ref 6.0–8.3)

## 2014-10-05 LAB — LIPID PANEL
CHOLESTEROL: 112 mg/dL (ref 0–200)
HDL: 34 mg/dL — AB (ref >39)
LDL Cholesterol: 46 mg/dL (ref 0–99)
Total CHOL/HDL Ratio: 3.3 Ratio
Triglycerides: 161 mg/dL — ABNORMAL HIGH (ref <150)
VLDL: 32 mg/dL (ref 0–40)

## 2014-10-05 LAB — MICROALBUMIN, URINE: Microalb, Ur: 2.4 mg/dL — ABNORMAL HIGH (ref <2.0)

## 2014-10-05 LAB — POCT GLYCOSYLATED HEMOGLOBIN (HGB A1C): HEMOGLOBIN A1C: 9.3

## 2014-10-05 NOTE — Patient Instructions (Signed)
1.  Set alarm at nighttime to administer Lantus. 2.  We will refer you to diabetic education.

## 2014-10-05 NOTE — Progress Notes (Signed)
IDENTIFYING INFORMATION  Madison Cowan / DOB: [birth year] / MRN: [id number]  The patient has HYPERLIPIDEMIA-MIXED; OVERWEIGHT/OBESITY; TOBACCO ABUSE; HYPERTENSION, BENIGN; SLEEP APNEA; PALPITATIONS; PND; Multinodular goiter; Gout; DM (diabetes mellitus) type II controlled, neurological manifestation; and Spondylolisthesis at L4-L5 level on her problem list.  SUBJECTIVE  Chief Complaint: Diabetes and Hypertension   History of present illness: [name] is a 53 y.o. year old female who presents diabetes and blood pressure check.  She reports checking her blood sugar once every couple of days.  She is taking metformin 1000 mg bid, Lantus 50 bid, and has meal time insulin, which she often forgets.  She makes poor food choices that include fast food and candy.  She continues to smoke 1/2 pack per day but would like to quit.  She is in the contemplation phase.  She plans to develop a formal smoking cessation plan before her next follow up and plans to discuss options at that time.  She would like a referral for a diabetes educator or to see a registered dietician. She reports some sock like paresthesia, and reports a history of neuropathy.  She denies chest pain, SOB, DOE, along with episodes of hypoglycemia.  Her lipids are controlled with rosuvastatin.    Her blood pressure is well controlled with her current regimen.  She does not check it at home, but is medication compliant.  She denies HA and scotoma.    She  has a past medical history of Diabetes mellitus without complication; Hypertension; Asthma; Arthritis; and Allergy.    She has a current medication list which includes the following prescription(s): albuterol, vitamin c, aspirin, cetirizine, crestor, insulin aspart, insulin glargine, insulin pen needle, insulin syringes (disposable), losartan, metformin, multivitamin with minerals, one touch ultra test, rosuvastatin, b complex vitamins, calcium-vitamin d, co-enzyme q-10, and  tramadol.  [name] has No Known Allergies. She  reports that she has been smoking.  She has never used smokeless tobacco. She reports that she does not drink alcohol or use illicit drugs. She  has no sexual activity history on file.  The patient  has past surgical history that includes Cholecystectomy open (1979).  Her family history includes Cancer in her mother; Cancer (age of onset: 41) in her brother; Diabetes in her father and mother; Heart disease in her brother, brother, and father; Heart disease (age of onset: 75) in her sister.  Review of Systems  Constitutional: Negative.   Respiratory: Negative for cough and shortness of breath.   Cardiovascular: Positive for palpitations. Negative for chest pain and leg swelling.  Gastrointestinal: Negative for nausea, vomiting and abdominal pain.  Genitourinary: Negative for flank pain.  Musculoskeletal: Positive for joint pain.  Skin: Negative.   Neurological: Positive for tingling. Negative for dizziness and headaches.     OBJECTIVE  Blood pressure 128/70, pulse 105, temperature 98.6 F (37 C), temperature source Oral, resp. rate 16, height 5' 2.5" (1.588 m), weight 216 lb 6.4 oz (98.158 kg), SpO2 96 %. The patient's body mass index is 38.92 kg/(m^2).  Physical Exam  Constitutional: She is oriented to person, place, and time. She appears well-developed and well-nourished. No distress.  HENT:  Right Ear: External ear normal.  Left Ear: External ear normal.  Nose: Nose normal.  Mouth/Throat: Oropharynx is clear and moist.  Eyes: No scleral icterus.  Neck: Normal range of motion.  Cardiovascular: Normal rate, regular rhythm and normal heart sounds.   Respiratory: Effort normal and breath sounds normal.  GI: She exhibits no distension.  Neurological: She is alert and oriented to person, place, and time. She has normal reflexes. No cranial nerve deficit.  Skin: Skin is warm and dry. She is not diaphoretic.  Psychiatric: She has a  normal mood and affect. Her behavior is normal. Judgment and thought content normal.    Results for orders placed or performed in visit on 10/05/14 (from the past 24 hour(s))  POCT glycosylated hemoglobin (Hb A1C)     Status: Abnormal   Collection Time: 10/05/14 10:45 AM  Result Value Ref Range   Hemoglobin A1C 9.3   POCT urinalysis dipstick     Status: Normal   Collection Time: 10/05/14 10:45 AM  Result Value Ref Range   Color, UA yellow    Clarity, UA clear    Glucose, UA neg    Bilirubin, UA neg    Ketones, UA trace    Spec Grav, UA 1.015    Blood, UA neg    pH, UA 5.0    Protein, UA neg    Urobilinogen, UA 0.2    Nitrite, UA neg    Leukocytes, UA Negative     ASSESSMENT & PLAN  Al[place] was seen today for diabetes and hypertension.  Diagnoses and associated orders for this visit:  DM (diabetes mellitus) type II controlled, neurological manifestation: A1C elevated relative to last measure 2/2 poor medication compliance, lack of exercise, and poor food choices. Patient will focus on improving her diet and being medication compliant before her next follow up.  A diabetic education referral is in to [place], per Dr. Thompson Caul recommendation.   - HM Diabetes Foot Exam - POCT glycosylated hemoglobin (Hb A1C) - POCT urinalysis dipstick - Microalbumin, urine  Essential hypertension, benign - CBC with Differential - COMPLETE METABOLIC PANEL WITH GFR  Hyperlipemia - Lipid panel  Flu vaccine need - Flu Vaccine QUAD 36+ mos IM  Encounter for smoking cessation counseling -     Patient not amenable to stopping today.  She is amenable to preparing to quit, and will formulate a plan for discussion at her next f/u.     Plan was discussed with Dr. Tamala Julian and she is in agreement.  The patient was instructed to to call or comeback to clinic as needed, or should symptoms warrant.  Philis Fendt, MHS, PA-C [facility] 10/05/2014  11:32 AM

## 2014-10-08 ENCOUNTER — Encounter: Payer: Self-pay | Admitting: Family Medicine

## 2014-10-08 NOTE — Progress Notes (Signed)
History and physical examinations obtained with Philis Fendt, PA-C. Agree with Assessment and Plan.  S:  DMII: frequently forgets nighttime dose of Lantus.  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 Usually remembers Novolog bid.  Not exercising; eating out a lot; making poor food choices.  Really wants to work on diet.  HTN: Patient reports good compliance with medication, good tolerance to medication, and good symptom control.  Denies Cp/Palp/SOB/leg swelling.  Hyperlipidemia: Patient reports good compliance with medication, good tolerance to medication, and good symptom control.   O: Neck: no thyromegaly; no cervical LAD. CV: RRR: no m/g/r.  Lungs: CTA B. Abdomen: obese; soft NT, ND; bsna.  Extremities; trace pitting edema.  A/P: DMII: uncontrolled; refer for diabetic education; no changes to management today; will start setting evening alarm to administer qhs Lantus.  HTN: controlled; obtain labs; no changes to management.  Hyperlipidemia: controlled; obtain labs; no changes to management. Tobacco abuse: precontemplative; encourage cessation and development of a plan for cessation.  Will discuss further at next visit.

## 2014-11-07 ENCOUNTER — Other Ambulatory Visit: Payer: Self-pay | Admitting: Physician Assistant

## 2014-11-19 ENCOUNTER — Telehealth: Payer: BC Managed Care – PPO | Admitting: Nurse Practitioner

## 2014-11-19 DIAGNOSIS — J0100 Acute maxillary sinusitis, unspecified: Secondary | ICD-10-CM

## 2014-11-19 MED ORDER — AZITHROMYCIN 250 MG PO TABS: ORAL_TABLET | ORAL | Status: DC

## 2014-11-19 NOTE — Progress Notes (Signed)

## 2014-11-24 ENCOUNTER — Ambulatory Visit (INDEPENDENT_AMBULATORY_CARE_PROVIDER_SITE_OTHER): Payer: BC Managed Care – PPO

## 2014-11-24 ENCOUNTER — Telehealth: Payer: Self-pay | Admitting: Family Medicine

## 2014-11-24 ENCOUNTER — Ambulatory Visit (INDEPENDENT_AMBULATORY_CARE_PROVIDER_SITE_OTHER): Payer: BC Managed Care – PPO | Admitting: Family Medicine

## 2014-11-24 ENCOUNTER — Telehealth: Payer: Self-pay

## 2014-11-24 ENCOUNTER — Ambulatory Visit
Admission: RE | Admit: 2014-11-24 | Discharge: 2014-11-24 | Disposition: A | Discharge status: Home / Self Care | Payer: BC Managed Care – PPO | Source: Ambulatory Visit | Attending: Family Medicine | Admitting: Family Medicine

## 2014-11-24 ENCOUNTER — Other Ambulatory Visit: Payer: Self-pay | Admitting: Family Medicine

## 2014-11-24 VITALS — BP 146/72 | HR 108 | Temp 98.7°F | Resp 18 | Ht 62.5 in | Wt 212.4 lb

## 2014-11-24 DIAGNOSIS — H5789 Other specified disorders of eye and adnexa: Secondary | ICD-10-CM

## 2014-11-24 DIAGNOSIS — H539 Unspecified visual disturbance: Secondary | ICD-10-CM

## 2014-11-24 DIAGNOSIS — H5711 Ocular pain, right eye: Secondary | ICD-10-CM

## 2014-11-24 DIAGNOSIS — E1149 Type 2 diabetes mellitus with other diabetic neurological complication: Secondary | ICD-10-CM

## 2014-11-24 DIAGNOSIS — D72829 Elevated white blood cell count, unspecified: Secondary | ICD-10-CM

## 2014-11-24 DIAGNOSIS — J0100 Acute maxillary sinusitis, unspecified: Secondary | ICD-10-CM

## 2014-11-24 DIAGNOSIS — H578 Other specified disorders of eye and adnexa: Secondary | ICD-10-CM

## 2014-11-24 DIAGNOSIS — J019 Acute sinusitis, unspecified: Secondary | ICD-10-CM

## 2014-11-24 LAB — POCT CBC
Granulocyte percent: 69 %G (ref 37–80)
HCT, POC: 45.2 % (ref 37.7–47.9)
HEMOGLOBIN: 14.8 g/dL (ref 12.2–16.2)
Lymph, poc: 4.6 — AB (ref 0.6–3.4)
MCH, POC: 28.2 pg (ref 27–31.2)
MCHC: 32.7 g/dL (ref 31.8–35.4)
MCV: 86.2 fL (ref 80–97)
MID (cbc): 0.7 (ref 0–0.9)
MPV: 7.7 fL (ref 0–99.8)
POC GRANULOCYTE: 11.9 — AB (ref 2–6.9)
POC LYMPH %: 26.7 % (ref 10–50)
POC MID %: 4.3 %M (ref 0–12)
Platelet Count, POC: 295 10*3/uL (ref 142–424)
RBC: 5.24 M/uL (ref 4.04–5.48)
RDW, POC: 14 %
WBC: 17.3 10*3/uL — AB (ref 4.6–10.2)

## 2014-11-24 LAB — GLUCOSE, POCT (MANUAL RESULT ENTRY): POC Glucose: 217 mg/dl — AB (ref 70–99)

## 2014-11-24 MED ORDER — IOHEXOL 300 MG/ML  SOLN
50.0000 mL | Freq: Once | INTRAMUSCULAR | Status: AC | PRN
  Administered 2014-11-24: 50 mL via INTRAVENOUS

## 2014-11-24 MED ORDER — AMOXICILLIN-POT CLAVULANATE 875-125 MG PO TABS: 1.0000 | ORAL_TABLET | Freq: Two times a day (BID) | ORAL | Status: DC

## 2014-11-24 NOTE — Progress Notes (Signed)
Subjective: Last week the patient started getting this problem. She initially had pain and pressure around her right eye. Since then it has gradually progressed and gotten worse. Last week she had an e-visit Consultation and was treated with a course of azithromycin. She then proceeded on to develop some puffiness of the right eyelids and difficulty with her vision in the right eye with the right seemed to drift to a more lateral gaze giving her some double vision sensation. This is not constant. She did go see an optometrist 2 days ago, and he felt like he probably still consistent with the sinuses causing infection, and since she was on antibiotics already he did not do anything differently. She has continued to have some worsening symptoms so came in here today. She has not been febrile. She is diabetic, on insulin, with sugars that have been running in the low 100s much of the time, high or some other times. Her last hemoglobin A1c was elevated at 9.2. She is a patient of Dr. Reginia Forts. 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 Has seen Dr. Baird Cancer, retinologist. He is in surgery this afternoon.  Objective: Alert and oriented. TMs are normal. Eyes PERRLA. EOMs appear intact. Her right orbit looks a little bit edematous with mild narrowing of the openness of the right eye compared to the left. 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 Fundi cannot be well visualized. He seems to be neurologically intact. Alert and oriented without any slurring of speech. No other facial weakness noted  [facility] reading (PRIMARY) by  Dr. Maggie Schwalbe View Sinuses: Frontal sinuses and maxillary sinuses appear to be clear and symmetrical.  Assessment: Right eye pain History of CVA right eye Insulin-dependent diabetic Intermittent diplopia with right gaze intermittently disconjugate Mild orbital  swelling Possible posterior sinusitis in ethmoid or sphenoid sinuses Leukocytosis  Plan: We were able to get her a work in appointment with the ophthalmologist for tomorrow morning. I think she needs to be seen before the weekend. She is to go to the emergency room if abruptly worsening time.  We'll try to get a CT scan of the sinuses this evening.  Augmentin 875 twice daily  Results for orders placed or performed in visit on 11/24/14  POCT CBC  Result Value Ref Range   WBC 17.3 (A) 4.6 - 10.2 K/uL   Lymph, poc 4.6 (A) 0.6 - 3.4   POC LYMPH PERCENT 26.7 10 - 50 %L   MID (cbc) 0.7 0 - 0.9   POC MID % 4.3 0 - 12 %M   POC Granulocyte 11.9 (A) 2 - 6.9   Granulocyte percent 69.0 37 - 80 %G   RBC 5.24 4.04 - 5.48 M/uL   Hemoglobin 14.8 12.2 - 16.2 g/dL   HCT, POC 45.2 37.7 - 47.9 %   MCV 86.2 80 - 97 fL   MCH, POC 28.2 27 - 31.2 pg   MCHC 32.7 31.8 - 35.4 g/dL   RDW, POC 14.0 %   Platelet Count, POC 295 142 - 424 K/uL   MPV 7.7 0 - 99.8 fL  POCT glucose (manual entry)  Result Value Ref Range   POC Glucose 217 (A) 70 - 99 mg/dl

## 2014-11-24 NOTE — Telephone Encounter (Signed)
Done and approved.  CT was normal.

## 2014-11-24 NOTE — Patient Instructions (Signed)
Take the tramadol that you have every 6 hours if needed for pain.  Take Augmentin (amoxicillin/clavulanate) one twice daily  See the ophthalmologist tomorrow as directed  CT scan of the sinuses will be done today

## 2014-11-24 NOTE — Telephone Encounter (Signed)
Pt had a CT scan done today. Per Dr. Linna Darner, call pt and let her know that it looked OK and to ask if she has any Flonase. If not call some in for her. He wants her to use the nasal spray BID for 3 days and then QD Left message on machine to call back

## 2014-11-24 NOTE — Telephone Encounter (Signed)
This needs a peer to peer [phone] Subscriber # [id number]  [place]

## 2014-11-25 ENCOUNTER — Other Ambulatory Visit (HOSPITAL_COMMUNITY): Payer: Self-pay | Admitting: Ophthalmology

## 2014-11-25 ENCOUNTER — Ambulatory Visit (HOSPITAL_COMMUNITY)
Admission: RE | Admit: 2014-11-25 | Discharge: 2014-11-25 | Disposition: A | Discharge status: Home / Self Care | Payer: BC Managed Care – PPO | Source: Ambulatory Visit | Attending: Ophthalmology | Admitting: Ophthalmology

## 2014-11-25 DIAGNOSIS — H4901 Third [oculomotor] nerve palsy, right eye: Secondary | ICD-10-CM | POA: Diagnosis not present

## 2014-11-25 DIAGNOSIS — H5711 Ocular pain, right eye: Secondary | ICD-10-CM | POA: Diagnosis not present

## 2014-11-25 DIAGNOSIS — H532 Diplopia: Secondary | ICD-10-CM | POA: Diagnosis present

## 2014-11-25 LAB — POCT I-STAT CREATININE: CREATININE: 0.9 mg/dL (ref 0.50–1.10)

## 2014-11-25 MED ORDER — GADOBENATE DIMEGLUMINE 529 MG/ML IV SOLN
20.0000 mL | Freq: Once | INTRAVENOUS | Status: AC | PRN
  Administered 2014-11-25: 20 mL via INTRAVENOUS

## 2014-11-25 NOTE — Telephone Encounter (Signed)
Cell number still goes straight to VM. Did not leave another message. Called home number and LM to CB instead.

## 2014-11-27 MED ORDER — FLUTICASONE PROPIONATE 50 MCG/ACT NA SUSP: NASAL | Status: DC

## 2014-11-27 NOTE — Telephone Encounter (Signed)
Pt notified of results and flonase sent in

## 2014-11-29 ENCOUNTER — Ambulatory Visit (HOSPITAL_COMMUNITY)
Admission: RE | Admit: 2014-11-29 | Discharge: 2014-11-29 | Disposition: A | Discharge status: Home / Self Care | Payer: BC Managed Care – PPO | Source: Ambulatory Visit | Attending: Ophthalmology | Admitting: Ophthalmology

## 2014-12-10 ENCOUNTER — Telehealth: Payer: Self-pay

## 2014-12-10 NOTE — Telephone Encounter (Signed)
The patient called to request Rx for Tramadol.  The patient states this was offered to her at the appointment on 11/24/14, however because she had enough at home to get her to the appointment that was originally scheduled for 12/02/14.  This appointment was rescheduled to 12/23/14 due to the snow on 12/02/14--the patient states that she only has 1 tramadol left and is still experiencing eye and head pain.  Please call the patient at [phone].

## 2014-12-12 NOTE — Telephone Encounter (Signed)
Can we get her enough Tramadol to last until her appt? Dr Linna Darner, you saw her for acute eye pain.

## 2014-12-13 MED ORDER — TRAMADOL HCL 50 MG PO TABS: 50.0000 mg | ORAL_TABLET | Freq: Three times a day (TID) | ORAL | Status: DC | PRN

## 2014-12-13 NOTE — Telephone Encounter (Signed)
Prescribe trazodone 50  #30  Take one every eight hours as needed.  NRF

## 2014-12-13 NOTE — Telephone Encounter (Signed)
Sent in Rx to pharmacy. Left a detailed message advising pt.

## 2015-01-18 ENCOUNTER — Encounter: Payer: Self-pay | Admitting: Family Medicine

## 2015-01-18 ENCOUNTER — Ambulatory Visit (INDEPENDENT_AMBULATORY_CARE_PROVIDER_SITE_OTHER): Payer: BC Managed Care – PPO | Admitting: Family Medicine

## 2015-01-18 VITALS — BP 150/86 | HR 99 | Temp 98.5°F | Resp 16 | Ht 62.5 in | Wt 208.8 lb

## 2015-01-18 DIAGNOSIS — Z01419 Encounter for gynecological examination (general) (routine) without abnormal findings: Secondary | ICD-10-CM

## 2015-01-18 DIAGNOSIS — E785 Hyperlipidemia, unspecified: Secondary | ICD-10-CM

## 2015-01-18 DIAGNOSIS — Z8041 Family history of malignant neoplasm of ovary: Secondary | ICD-10-CM

## 2015-01-18 DIAGNOSIS — Z23 Encounter for immunization: Secondary | ICD-10-CM | POA: Diagnosis not present

## 2015-01-18 DIAGNOSIS — Z0000 Encounter for general adult medical examination without abnormal findings: Secondary | ICD-10-CM | POA: Diagnosis not present

## 2015-01-18 DIAGNOSIS — Z803 Family history of malignant neoplasm of breast: Secondary | ICD-10-CM

## 2015-01-18 DIAGNOSIS — I10 Essential (primary) hypertension: Secondary | ICD-10-CM | POA: Diagnosis not present

## 2015-01-18 DIAGNOSIS — R002 Palpitations: Secondary | ICD-10-CM | POA: Diagnosis not present

## 2015-01-18 DIAGNOSIS — M109 Gout, unspecified: Secondary | ICD-10-CM

## 2015-01-18 DIAGNOSIS — E1149 Type 2 diabetes mellitus with other diabetic neurological complication: Secondary | ICD-10-CM

## 2015-01-18 LAB — CBC WITH DIFFERENTIAL/PLATELET
Basophils Absolute: 0 10*3/uL (ref 0.0–0.1)
Basophils Relative: 0 % (ref 0–1)
EOS PCT: 2 % (ref 0–5)
Eosinophils Absolute: 0.2 10*3/uL (ref 0.0–0.7)
HCT: 40.6 % (ref 36.0–46.0)
HEMOGLOBIN: 13.8 g/dL (ref 12.0–15.0)
LYMPHS ABS: 4.1 10*3/uL — AB (ref 0.7–4.0)
LYMPHS PCT: 37 % (ref 12–46)
MCH: 28.8 pg (ref 26.0–34.0)
MCHC: 34 g/dL (ref 30.0–36.0)
MCV: 84.8 fL (ref 78.0–100.0)
MONO ABS: 0.9 10*3/uL (ref 0.1–1.0)
MPV: 10 fL (ref 8.6–12.4)
Monocytes Relative: 8 % (ref 3–12)
NEUTROS PCT: 53 % (ref 43–77)
Neutro Abs: 5.8 10*3/uL (ref 1.7–7.7)
Platelets: 232 10*3/uL (ref 150–400)
RBC: 4.79 MIL/uL (ref 3.87–5.11)
RDW: 13.6 % (ref 11.5–15.5)
WBC: 11 10*3/uL — AB (ref 4.0–10.5)

## 2015-01-18 LAB — COMPREHENSIVE METABOLIC PANEL
ALK PHOS: 61 U/L (ref 39–117)
ALT: 27 U/L (ref 0–35)
AST: 27 U/L (ref 0–37)
Albumin: 3.9 g/dL (ref 3.5–5.2)
BILIRUBIN TOTAL: 0.5 mg/dL (ref 0.2–1.2)
BUN: 15 mg/dL (ref 6–23)
CALCIUM: 9.5 mg/dL (ref 8.4–10.5)
CHLORIDE: 102 meq/L (ref 96–112)
CO2: 26 meq/L (ref 19–32)
Creat: 0.83 mg/dL (ref 0.50–1.10)
Glucose, Bld: 114 mg/dL — ABNORMAL HIGH (ref 70–99)
Potassium: 3.9 mEq/L (ref 3.5–5.3)
Sodium: 141 mEq/L (ref 135–145)
TOTAL PROTEIN: 6.5 g/dL (ref 6.0–8.3)

## 2015-01-18 LAB — POCT URINALYSIS DIPSTICK
Bilirubin, UA: NEGATIVE
Blood, UA: NEGATIVE
Glucose, UA: NEGATIVE
Ketones, UA: NEGATIVE
LEUKOCYTES UA: NEGATIVE
Nitrite, UA: NEGATIVE
Protein, UA: NEGATIVE
SPEC GRAV UA: 1.02
Urobilinogen, UA: 0.2
pH, UA: 5

## 2015-01-18 LAB — TSH: TSH: 1.921 u[IU]/mL (ref 0.350–4.500)

## 2015-01-18 LAB — HEMOGLOBIN A1C
Hgb A1c MFr Bld: 8.1 % — ABNORMAL HIGH (ref <5.7)
Mean Plasma Glucose: 186 mg/dL — ABNORMAL HIGH (ref <117)

## 2015-01-18 LAB — LIPID PANEL
Cholesterol: 91 mg/dL (ref 0–200)
HDL: 24 mg/dL — ABNORMAL LOW (ref >=46)
LDL Cholesterol: 47 mg/dL (ref 0–99)
Total CHOL/HDL Ratio: 3.8 Ratio
Triglycerides: 100 mg/dL (ref <150)
VLDL: 20 mg/dL (ref 0–40)

## 2015-01-18 LAB — URIC ACID: Uric Acid, Serum: 7.1 mg/dL — ABNORMAL HIGH (ref 2.4–7.0)

## 2015-01-18 MED ORDER — LOSARTAN POTASSIUM-HCTZ 100-12.5 MG PO TABS: 1.0000 | ORAL_TABLET | Freq: Every day | ORAL | Status: DC

## 2015-01-18 NOTE — Progress Notes (Signed)
Subjective:    Patient ID: Madison Cowan, female    DOB: [birth year], 54 y.o.   MRN: [id number]  HPI This 54 y.o. female presents for Complete Physical Examination.  Last physical:  09/29/2013 Pap smear:  09/29/2013 Mammogram: 11/2013 in [place].  Has gotten letter from facility.  Pt to schedule.   Colonoscopy:  Never; plans to schedule this summer? TDAP: 2008 Pneumovax:  2008 Influenza:  10/05/2014 Hepatitis B series:  Not sure.  Cannot recall.   Eye exam: 11/2014; no diabetic retinopathy.  Sherlynn Stalls.  Dental exam:  Every six months.     DMII:  Referred to diabetic education/nutrition consultation.  HgbA1c of 9.3.  Never got a call about nutrition consultation.  Was forgetting Lantus qhs; was to set an alarm.  Doing better with Lantus.  Not checking sugars regularly.  Fastings are 110 yesterday; has had some 90s some mornings.  Last night pp was 156. Novolog 10-15 untis bid to tid. Lantus 50 units 48 bid.   Metformin bid 1087m +numbness in R leg and R arm.    HTN: blood pressure has been running high for the past year.  Taking Losartan 1021mdaily.    Hyperlipidemia: Patient reports good compliance with medication, good tolerance to medication, and good symptom control.  Crestor 1010maily.    Tobacco abuse:  Down to 5 cigarettes to day.  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  Evaluated by ophthalmology.  No improvement.  Then evaluated by Dr. HopLinna Darneroncerned; scheduled appointment with Dr. JasLessie Dingsecialist.  S/p CT maxillofacial.  S/p MR orbits and brian.  [facility].  Diagnosed with third nerve palsy; was initial concern of brain aneurysm; s/p MRA negative for brain aneurysm.  Next appointment in April 2016 with [facility] who specializes in prism.  Still having double vision.  Vision improving; double vision is improving.  No driving for six weeks.     TIA: occurred one year ago.  No recurrence.  Taking ASA 325m80mily.  OSA: only saw neurology once; sleep study 2008.  Palpitations: occurs frequently; very concerned about ongoing palpitations.    Increased frequency with stressors.  S/p stress testing a few years ago.  No Holter monitor.  Was drinking a lot of caffeine.  Denies associated chest pain, shortness of breath, near-syncope, or dizziness.   Review of Systems  Constitutional: Negative.  Negative for fever, chills, diaphoresis, activity change, appetite change, fatigue and unexpected weight change.  HENT: Positive for congestion, postnasal drip and tinnitus. Negative for dental problem, drooling, ear discharge, ear pain, facial swelling, hearing loss, mouth sores, nosebleeds, rhinorrhea, sinus pressure, sneezing, sore throat, trouble swallowing and voice change.   Eyes: Positive for visual disturbance. Negative for photophobia, pain, discharge, redness and itching.  Respiratory: Positive for cough. Negative for apnea, choking, chest tightness, shortness of breath, wheezing and stridor.   Cardiovascular: Positive for palpitations. Negative for chest pain and leg swelling.  Gastrointestinal: Negative.  Negative for nausea, vomiting, abdominal pain, diarrhea, constipation, blood in stool, abdominal distention, anal bleeding and rectal pain.  Endocrine: Negative.  Negative for cold intolerance, heat intolerance, polydipsia, polyphagia and polyuria.  Genitourinary: Negative.  Negative for dysuria, urgency, frequency, hematuria, flank pain, decreased urine volume, vaginal bleeding, vaginal discharge, enuresis, difficulty urinating, genital sores, vaginal pain, menstrual problem, pelvic pain and dyspareunia.  Musculoskeletal: Positive for arthralgias. Negative for myalgias, back pain, joint swelling, gait problem,  neck pain and neck stiffness.  Skin: Negative.  Negative for color change, pallor, rash and wound.  Allergic/Immunologic:  Positive for environmental allergies. Negative for food allergies and immunocompromised state.  Neurological: Positive for numbness. Negative for dizziness, tremors, seizures, syncope, facial asymmetry, speech difficulty, weakness, light-headedness and headaches.  Hematological: Negative.  Negative for adenopathy. Does not bruise/bleed easily.  Psychiatric/Behavioral: Negative.  Negative for suicidal ideas, hallucinations, behavioral problems, confusion, sleep disturbance, self-injury, dysphoric mood, decreased concentration and agitation. The patient is not nervous/anxious and is not hyperactive.    Past Medical History  Diagnosis Date  . Diabetes mellitus without complication   . Hypertension   . Hyperlipidemia   . OSA (obstructive sleep apnea) 11/11/2006    CPAP  . Heart murmur   . Glaucoma   . Allergy     Zyrtec daily.  . Arthritis     DDD lumbar spine  . Asthma     Albuterol seasonally.  . Thyroid disease     Thyroid nodules s/p needle biopsy negative.    . Neuromuscular disorder     R third nerve palsy   Past Surgical History  Procedure Laterality Date  . Cholecystectomy open  1979    No Known Allergies History   Social History  . Marital Status: Divorced    Spouse Name: N/A  . Number of Children: N/A  . 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 Smoking status: Current Every Day Smoker -- 0.05 packs/day for 20 years  . Smokeless tobacco: Never Used  . Alcohol Use: No  . Drug Use: No  . Sexual Activity: No   Other Topics Concern  . Not on file   Social History Narrative   Marital status: Divorced after 25 years of marriage; not dating.      Children:  None       Lives: alone.       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  Loves work      Tobacco: 5 cigarettes x 20 years.      Alcohol:  None      Drugs: none       Education: [employer].        Exercise: No.      Seatbelt: 100%      Guns: unloaded.      Sexually active: none; total partners = 5.  Last STD screen 2006.     Family History  Problem Relation Age of Onset  . Cancer Mother 42    Breast and ovarian cancer  . Diabetes Mother   . Hypertension Mother   . Diabetes Father   . Heart disease Father 39    CAD/CABG; AAA; CHF  . Cancer Father   . Hyperlipidemia Father   . Hypertension Father   . Heart disease Brother     Died of CHF at age 81.  Marland Kitchen Hyperlipidemia Brother   . Hypertension Brother   . Heart disease Sister 85    3-vessel CAD s/p 1 stenting  . Diabetes Sister   . Hyperlipidemia Sister   . Hypertension Sister   . Cancer Brother 51    GI- esophageal.  . Heart disease Brother     MIs in 60s and CABG.  . Diabetes Brother   . Hypertension Brother   . Stroke Maternal Grandmother   . Cancer Maternal Grandfather   . Heart  disease Paternal Grandfather        Objective:   Physical Exam  Constitutional: She is oriented to person, place, and time. She appears well-developed and well-nourished. No distress.  obese  HENT:  Head: Normocephalic and atraumatic.  Right Ear: External ear normal.  Left Ear: External ear normal.  Nose: Nose normal.  Mouth/Throat: Oropharynx is clear and moist.  Eyes: Conjunctivae and EOM are normal. Pupils are equal, round, and reactive to light.  Neck: Normal range of motion and full passive range of motion without pain. Neck supple. No JVD present. Carotid bruit is not present. No thyromegaly present.  Cardiovascular: Normal rate, regular rhythm and normal heart sounds.  Exam reveals no gallop and no friction rub.   No murmur heard. Pulmonary/Chest: Effort normal and breath sounds normal. She has no wheezes. She has no rales. Right breast exhibits no inverted nipple, no mass, no nipple discharge, no skin change and no tenderness. Left breast exhibits no inverted nipple, no mass, no nipple discharge, no skin change and no  tenderness. Breasts are symmetrical.  Abdominal: Soft. Bowel sounds are normal. She exhibits no distension and no mass. There is no tenderness. There is no rebound and no guarding.  Genitourinary: Vagina normal and uterus normal. There is no rash, tenderness or lesion on the right labia. There is no rash, tenderness or lesion on the left labia. Cervix exhibits no motion tenderness. Right adnexum displays no mass, no tenderness and no fullness. Left adnexum displays no mass, no tenderness and no fullness.  Musculoskeletal:       Right shoulder: Normal.       Left shoulder: Normal.       Cervical back: Normal.  Lymphadenopathy:    She has no cervical adenopathy.  Neurological: She is alert and oriented to person, place, and time. She has normal reflexes. No cranial nerve deficit. She exhibits normal muscle tone. Coordination normal.  Skin: Skin is warm and dry. No rash noted. She is not diaphoretic. No erythema. No pallor.  Psychiatric: She has a normal mood and affect. Her behavior is normal. Judgment and thought content normal.  Nursing note and vitals reviewed.  EKG: NSR; no ST changes; no arrhythmia.  HEPATITIS B#1.    Assessment & Plan:   1. Routine physical examination   2. Essential hypertension, benign   3. Hyperlipidemia   4. DM (diabetes mellitus) type II controlled, neurological manifestation   5. Gout of multiple sites, unspecified cause, unspecified chronicity   6. Palpitations   7. Encounter for routine gynecological examination   8. Need for hepatitis B vaccination   9. Family history of breast cancer in mother   45. Family history of ovarian cancer     1. Complete Physical Examination: anticipatory guidance --- exercise, weight loss, smoking cessation. Pap smear obtained.  Pt to schedule mammogram.  Pt agreeable to scheduling colonoscopy at next visit.  Immunizations reviewed --- s/p Hepatitis B#1.   2.  Gynecological exam: pap smear obtained; pt to schedule mammogram;  menopausal. 3.  HTN: elevated today; obtain labs, u/a, EKG.  Change Losartan to Losartan-HCTZ 100-12.72m daily. 4.  Hyperlipidemia: stable; obtain labs; continue current medications. 5.  DMII: improved control with improved compliance with evening Lantus. Will schedule nutrition consultation over the summer; schedule at next OV.   6.  Gout: stable; obtain labs. 7. Palpitations: New/worsening; refer to cardiology. 8.  S/p Hepatitis B#1; RTC three months for Hepatitis B#2. 985 Mother with breast cancer and ovarian cancer: no previous  genetics consultation; will refer to genetics at next visit to evaluate for BRCA gene.   Meds ordered this encounter  Medications  . losartan-hydrochlorothiazide (HYZAAR) 100-12.5 MG per tablet    Sig: Take 1 tablet by mouth daily.    Dispense:  90 tablet    Refill:  3    Norwood Levo, M.D. [facility] [address] [place], [place]  [address] [phone] phone [phone] fax

## 2015-01-18 NOTE — Patient Instructions (Signed)

## 2015-01-20 LAB — PAP IG, CT-NG NAA, HPV HIGH-RISK
CHLAMYDIA PROBE AMP: NEGATIVE
GC Probe Amp: NEGATIVE
HPV DNA HIGH RISK: NOT DETECTED

## 2015-02-04 DIAGNOSIS — Z803 Family history of malignant neoplasm of breast: Secondary | ICD-10-CM | POA: Insufficient documentation

## 2015-02-04 DIAGNOSIS — Z8041 Family history of malignant neoplasm of ovary: Secondary | ICD-10-CM | POA: Insufficient documentation

## 2015-02-23 ENCOUNTER — Ambulatory Visit (INDEPENDENT_AMBULATORY_CARE_PROVIDER_SITE_OTHER): Payer: BC Managed Care – PPO | Admitting: Family Medicine

## 2015-02-23 DIAGNOSIS — Z23 Encounter for immunization: Secondary | ICD-10-CM

## 2015-02-24 NOTE — Progress Notes (Signed)
Immunization visit only.  No provider visit.

## 2015-03-21 ENCOUNTER — Encounter: Payer: Self-pay | Admitting: Cardiology

## 2015-03-21 ENCOUNTER — Ambulatory Visit (INDEPENDENT_AMBULATORY_CARE_PROVIDER_SITE_OTHER): Payer: BC Managed Care – PPO | Admitting: Cardiology

## 2015-03-21 VITALS — BP 132/74 | HR 94 | Ht 62.5 in | Wt 212.0 lb

## 2015-03-21 DIAGNOSIS — R002 Palpitations: Secondary | ICD-10-CM | POA: Diagnosis not present

## 2015-03-21 DIAGNOSIS — I10 Essential (primary) hypertension: Secondary | ICD-10-CM

## 2015-03-21 DIAGNOSIS — Z8249 Family history of ischemic heart disease and other diseases of the circulatory system: Secondary | ICD-10-CM

## 2015-03-21 DIAGNOSIS — E785 Hyperlipidemia, unspecified: Secondary | ICD-10-CM

## 2015-03-21 NOTE — Patient Instructions (Signed)
Medication Instructions:   Your physician recommends that you continue on your current medications as directed. Please refer to the Current Medication list given to you today.     Testing/Procedures:  Your physician has recommended that you wear a 24 HOUR holter monitor. Holter monitors are medical devices that record the heart's electrical activity. Doctors most often use these monitors to diagnose arrhythmias. Arrhythmias are problems with the speed or rhythm of the heartbeat. The monitor is a small, portable device. You can wear one while you do your normal daily activities. This is usually used to diagnose what is causing palpitations/syncope (passing out).    Follow-Up:  Your physician wants you to follow-up in: [place] will receive a reminder letter in the mail two months in advance. If you don't receive a letter, please call our office to schedule the follow-up appointment.

## 2015-03-21 NOTE — Progress Notes (Signed)
Patient ID: Madison Cowan, female   DOB: [birth year], 54 y.o.   MRN: [id number]      Cardiology Office Note  Date:  03/21/2015   ID:  Madison Cowan, DOB 1961-04-01, MRN [id number]  PCP:  Reginia Forts, MD  Cardiologist:   Dorothy Spark, MD   Chief complain: Palpitations.   History of Present Illness: Madison Cowan is a 54 y.o. female who presents for evaluation of palpitations, She has experienced extra beats followed skipped beats for quite some time. It is not associated with chest pain, dyspnea, or dizziness. No Cowan/o syncope. She is not very active but denies claudications, orthopnea, PND. She has very significant FH of premature CAD - Father had MI x 3, the first one in his early 25', CABG x 3, aortic aneurysm, brother MI at age 67, sister had MI at age 95.  She has Cowan/o DM, HLP and HTN that is managed by her PCP.   Past Medical History  Diagnosis Date  . Diabetes mellitus without complication   . Hypertension   . Hyperlipidemia   . OSA (obstructive sleep apnea) 11/11/2006    CPAP  . Heart murmur   . Glaucoma   . Allergy     Zyrtec daily.  . Arthritis     DDD lumbar spine  . Asthma     Albuterol seasonally.  . Thyroid disease     Thyroid nodules s/p needle biopsy negative.    . Neuromuscular disorder     R third nerve palsy   Past Surgical History  Procedure Laterality Date  . Cholecystectomy open  1979   Current Outpatient Prescriptions  Medication Sig Dispense Refill  . albuterol (PROVENTIL HFA;VENTOLIN HFA) 108 (90 BASE) MCG/ACT inhaler Inhale 2 puffs into the lungs every 6 (six) hours as needed for wheezing. 1 Inhaler 3  . Ascorbic Acid (VITAMIN C) 100 MG tablet Take 100 mg by mouth daily.    Marland Kitchen aspirin 325 MG EC tablet Take 325 mg by mouth daily.    Marland Kitchen b complex vitamins tablet Take 1 tablet by mouth daily.    . BD INSULIN SYRINGE ULTRAFINE 31G X 15/64" 0.3 ML MISC     . cetirizine (ZYRTEC) 10 MG tablet Take 10 mg by mouth daily.    . CRESTOR 10 MG tablet TAKE 1  TABLET (10 MG TOTAL) BY MOUTH DAILY. 30 tablet 2  . insulin aspart (NOVOLOG) 100 UNIT/ML injection Inject 10-15 Units into the skin 3 (three) times daily before meals. 3 vial 11  . Insulin Glargine (LANTUS SOLOSTAR) 100 UNIT/ML Solostar Pen INJECT 50 UNITS INTO THE SKIN 2 (TWO) TIMES DAILY. 30 mL 11  . Insulin Pen Needle (BD PEN NEEDLE NANO U/F) 32G X 4 MM MISC USE AS DIRECTED 100 each 11  . Insulin Syringes, Disposable, U-100 1 ML MISC 10-15 Units by Does not apply route 3 (three) times daily before meals. 100 each 11  . losartan-hydrochlorothiazide (HYZAAR) 100-12.5 MG per tablet Take 1 tablet by mouth daily. 90 tablet 3  . metFORMIN (GLUCOPHAGE) 1000 MG tablet Take 1 tablet (1,000 mg total) by mouth 2 (two) times daily. 60 tablet 11  . Multiple Vitamins-Minerals (MULTIVITAMIN WITH MINERALS) tablet Take 1 tablet by mouth daily.    . ONE TOUCH ULTRA TEST test strip TESTING TWICE DAILY 100 each 10   No current facility-administered medications for this visit.   Allergies:   Review of patient's allergies indicates no known allergies.   Social History:  The patient  reports that  she has been smoking.  She has never used smokeless tobacco. She reports that she does not drink alcohol or use illicit drugs.   Family History:  The patient's family history includes Cancer in her father and maternal grandfather; Cancer (age of onset: 84) in her mother; Cancer (age of onset: 38) in her brother; Diabetes in her brother, father, mother, and sister; Heart disease in her brother, brother, and paternal grandfather; Heart disease (age of onset: 13) in her father; Heart disease (age of onset: 27) in her sister; Hyperlipidemia in her brother, father, and sister; Hypertension in her brother, brother, father, mother, and sister; Stroke in her maternal grandmother.   ROS:  Please see the history of present illness.   Otherwise, review of systems are positive for none.   All other systems are reviewed and negative.    PHYSICAL EXAM: VS:  BP 132/74 mmHg  Pulse 94  Ht 5' 2.5" (1.588 m)  Wt 212 lb (96.163 kg)  BMI 38.13 kg/m2 , BMI Body mass index is 38.13 kg/(m^2). GEN: Well nourished, well developed, in no acute distress HEENT: normal Neck: no JVD, carotid bruits, or masses Cardiac: RRR; no murmurs, rubs, or gallops,no edema  Respiratory:  clear to auscultation bilaterally, normal work of breathing GI: soft, nontender, nondistended, + BS MS: no deformity or atrophy Skin: warm and dry, no rash Neuro:  Strength and sensation are intact Psych: euthymic mood, full affect  EKG:  EKG is ordered today. The ekg ordered today demonstrates SR, normal ECG.  Recent Labs: 01/18/2015: ALT 27; BUN 15; Creatinine 0.83; Hemoglobin 13.8; Platelets 232; Potassium 3.9; Sodium 141; TSH 1.921   Lipid Panel    Component Value Date/Time   CHOL 91 01/18/2015 0948   TRIG 100 01/18/2015 0948   HDL 24* 01/18/2015 0948   CHOLHDL 3.8 01/18/2015 0948   VLDL 20 01/18/2015 0948   LDLCALC 47 01/18/2015 0948   Wt Readings from Last 3 Encounters:  03/21/15 212 lb (96.163 kg)  01/18/15 208 lb 12.8 oz (94.711 kg)  11/24/14 212 lb 6.4 oz (96.344 kg)       ASSESSMENT AND PLAN:  1. Palpitations - most probably PVCs, we will order 24 hour Holter to evaluate for frequency and for possible other arrhythmias.  2. CAD prevention - the patient has very significant FH of premature CAD, however is asymptomatic and has normal ECG. No ischemic work up is necessary.  She is already on high potency statin.  3. HTN - controlled  4. Hyperlipidemia - on high potency statin with all lipids at goal. She is encouraged to exercise 5 x/week for 30 minutes.    Current medicines are reviewed at length with the patient today.  The patient does not have concerns regarding medicines.  The following changes have been made:  no change  Labs/ tests ordered today include:   Orders Placed This Encounter  Procedures  . Holter monitor - 24  hour  . EKG 12-Lead     Disposition:   FU with Madison Cowan in 1 years.   Signed, Dorothy Spark, MD  03/21/2015 5:11 PM    [facility] [place], [place], [place]  [address] Phone: [phone]; Fax: [phone]

## 2015-03-25 ENCOUNTER — Telehealth: Payer: Self-pay | Admitting: Family Medicine

## 2015-03-25 NOTE — Telephone Encounter (Signed)
lmom to call us to get information on her appt her appt was changed from 04/26/15 to 06/02/15 at 11:30

## 2015-03-28 ENCOUNTER — Ambulatory Visit (INDEPENDENT_AMBULATORY_CARE_PROVIDER_SITE_OTHER): Payer: BC Managed Care – PPO

## 2015-03-28 ENCOUNTER — Encounter: Payer: Self-pay | Admitting: *Deleted

## 2015-03-28 DIAGNOSIS — I10 Essential (primary) hypertension: Secondary | ICD-10-CM

## 2015-03-28 DIAGNOSIS — R002 Palpitations: Secondary | ICD-10-CM

## 2015-03-28 NOTE — Progress Notes (Signed)
Patient ID: Madison Cowan, female   DOB: 15-May-1961, 54 y.o.   MRN: [id number] [employer] 24 hour holter monitor applied to patient.

## 2015-04-13 ENCOUNTER — Encounter: Payer: Self-pay | Admitting: *Deleted

## 2015-04-26 ENCOUNTER — Ambulatory Visit: Payer: BC Managed Care – PPO | Admitting: Family Medicine

## 2015-05-11 ENCOUNTER — Other Ambulatory Visit: Payer: Self-pay | Admitting: Family Medicine

## 2015-05-23 ENCOUNTER — Other Ambulatory Visit: Payer: Self-pay | Admitting: Family Medicine

## 2015-05-31 ENCOUNTER — Ambulatory Visit (INDEPENDENT_AMBULATORY_CARE_PROVIDER_SITE_OTHER): Payer: BC Managed Care – PPO | Admitting: Family Medicine

## 2015-05-31 ENCOUNTER — Encounter: Payer: Self-pay | Admitting: Family Medicine

## 2015-05-31 VITALS — BP 134/78 | HR 87 | Temp 99.0°F | Resp 16 | Ht 63.0 in | Wt 212.4 lb

## 2015-05-31 DIAGNOSIS — G4733 Obstructive sleep apnea (adult) (pediatric): Secondary | ICD-10-CM | POA: Diagnosis not present

## 2015-05-31 DIAGNOSIS — Z8041 Family history of malignant neoplasm of ovary: Secondary | ICD-10-CM | POA: Diagnosis not present

## 2015-05-31 DIAGNOSIS — Z114 Encounter for screening for human immunodeficiency virus [HIV]: Secondary | ICD-10-CM

## 2015-05-31 DIAGNOSIS — Z720 Tobacco use: Secondary | ICD-10-CM

## 2015-05-31 DIAGNOSIS — E1149 Type 2 diabetes mellitus with other diabetic neurological complication: Secondary | ICD-10-CM | POA: Diagnosis not present

## 2015-05-31 DIAGNOSIS — Z803 Family history of malignant neoplasm of breast: Secondary | ICD-10-CM | POA: Diagnosis not present

## 2015-05-31 DIAGNOSIS — F17200 Nicotine dependence, unspecified, uncomplicated: Secondary | ICD-10-CM

## 2015-05-31 DIAGNOSIS — Z1211 Encounter for screening for malignant neoplasm of colon: Secondary | ICD-10-CM | POA: Diagnosis not present

## 2015-05-31 DIAGNOSIS — E785 Hyperlipidemia, unspecified: Secondary | ICD-10-CM

## 2015-05-31 DIAGNOSIS — I10 Essential (primary) hypertension: Secondary | ICD-10-CM | POA: Diagnosis not present

## 2015-05-31 LAB — COMPREHENSIVE METABOLIC PANEL
ALBUMIN: 4 g/dL (ref 3.5–5.2)
ALT: 24 U/L (ref 0–35)
AST: 22 U/L (ref 0–37)
Alkaline Phosphatase: 54 U/L (ref 39–117)
BUN: 15 mg/dL (ref 6–23)
CO2: 27 mEq/L (ref 19–32)
Calcium: 9.3 mg/dL (ref 8.4–10.5)
Chloride: 99 mEq/L (ref 96–112)
Creat: 0.82 mg/dL (ref 0.50–1.10)
Glucose, Bld: 105 mg/dL — ABNORMAL HIGH (ref 70–99)
POTASSIUM: 4.1 meq/L (ref 3.5–5.3)
Sodium: 139 mEq/L (ref 135–145)
Total Bilirubin: 0.5 mg/dL (ref 0.2–1.2)
Total Protein: 6.9 g/dL (ref 6.0–8.3)

## 2015-05-31 LAB — GLUCOSE, POCT (MANUAL RESULT ENTRY): POC Glucose: 126 mg/dl — AB (ref 70–99)

## 2015-05-31 LAB — POCT GLYCOSYLATED HEMOGLOBIN (HGB A1C): HEMOGLOBIN A1C: 7.8

## 2015-05-31 MED ORDER — GLUCOSE BLOOD VI STRP: ORAL_STRIP | Status: DC

## 2015-05-31 MED ORDER — INSULIN PEN NEEDLE 32G X 4 MM MISC: Status: DC

## 2015-05-31 MED ORDER — INSULIN GLARGINE 100 UNIT/ML SOLOSTAR PEN: PEN_INJECTOR | SUBCUTANEOUS | Status: DC

## 2015-05-31 MED ORDER — METFORMIN HCL 1000 MG PO TABS: ORAL_TABLET | ORAL | Status: DC

## 2015-05-31 NOTE — Progress Notes (Signed)
Subjective:    Patient ID: Madison Cowan, female    DOB: 21-Nov-1960, 54 y.o.   MRN: [id number]  05/31/2015  Diabetes; Hypertension; Hyperlipidemia; and Referral   HPI This 54 y.o. female presents for three month follow-up:  1.  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  Started seeing double again. L eye now affected; last episode was R eye; having a headache with vision changes.  Has not contacted ophthalmologist due to need to control sugars.  S/p MRI/MRA brain and orbits normal in 11/2014.  Sherlynn Stalls, MD is ophthalmologist.   Martin Majestic to [facility] through [facility].  Sanders referred to [facility].    2.  DMII:  Patient reports good compliance with medication, good tolerance to medication, and good symptom control.  Sugars likely uncontrolled in May 2016 due to dietary non-compliance. Checking bid now; fasting sugars now running 90s-120.  Random sugars running 160-250. Metformin 1000mg  bid.   Lantus  48units bid. Novolog 10-15 units qac.  3.  Hyperlipidemia: Patient reports good compliance with medication, good tolerance to medication, and good symptom control.    4.  HTN: Patient reports good compliance with medication, good tolerance to medication, and good symptom control.   Increased blood pressure medication at last visit.  Not checking at home.  5.  Palpitations: referred to cardiology.  Event monitor for 24 hours; WNL.  Less severe in past month.  Caffeine really triggers palpitations.  Not a coffee drinker or soda drinker; likes tea.  Prefers water.  Discovered Kool-Aid.     6. OSA: needs a new mask; needs prescription for mask.  [facility].  33. Mther with ovarian cancer and breast cancer:  Did not receive referral to genetics after last visit.  8. Requesting referral for colonoscopy.  Review of Systems  Constitutional: Negative for fever, chills, diaphoresis and fatigue.  Eyes: Positive for visual disturbance.    Respiratory: Negative for cough and shortness of breath.   Cardiovascular: Negative for chest pain, palpitations and leg swelling.  Gastrointestinal: Negative for nausea, vomiting, abdominal pain, diarrhea and constipation.  Endocrine: Negative for cold intolerance, heat intolerance, polydipsia, polyphagia and polyuria.  Neurological: Negative for dizziness, tremors, seizures, syncope, facial asymmetry, speech difficulty, weakness, light-headedness, numbness and headaches.    Past Medical History  Diagnosis Date  . Diabetes mellitus without complication   . Hypertension   . Hyperlipidemia   . OSA (obstructive sleep apnea) 11/11/2006    CPAP  . Heart murmur   . Glaucoma   . Allergy     Zyrtec daily.  . Arthritis     DDD lumbar spine  . Asthma     Albuterol seasonally.  . Thyroid disease     Thyroid nodules s/p needle biopsy negative.    . Neuromuscular disorder     R third nerve palsy   Past Surgical History  Procedure Laterality Date  . Cholecystectomy open  1979   No Known Allergies Current Outpatient Prescriptions  Medication Sig Dispense Refill  . albuterol (PROVENTIL HFA;VENTOLIN HFA) 108 (90 BASE) MCG/ACT inhaler Inhale 2 puffs into the lungs every 6 (six) hours as needed for wheezing. 1 Inhaler 3  . Ascorbic Acid (VITAMIN C) 100 MG tablet Take 100 mg by mouth daily.    Marland Kitchen aspirin 325 MG EC tablet Take 325 mg by mouth daily.    Marland Kitchen b complex vitamins tablet Take 1 tablet by mouth daily.    Marland Kitchen  BD INSULIN SYRINGE ULTRAFINE 31G X 15/64" 0.3 ML MISC     . cetirizine (ZYRTEC) 10 MG tablet Take 10 mg by mouth daily.    . CRESTOR 10 MG tablet TAKE 1 TABLET (10 MG TOTAL) BY MOUTH DAILY. 30 tablet 2  . glucose blood (ONE TOUCH ULTRA TEST) test strip TESTING TWICE DAILY 100 each 3  . Insulin Glargine (LANTUS SOLOSTAR) 100 UNIT/ML Solostar Pen INJECT 50 UNITS INTO THE SKIN 2 (TWO) TIMES DAILY. 30 mL 11  . Insulin Pen Needle (BD PEN NEEDLE NANO U/F) 32G X 4 MM MISC USE AS DIRECTED 100  each 11  . losartan-hydrochlorothiazide (HYZAAR) 100-12.5 MG per tablet Take 1 tablet by mouth daily. 90 tablet 3  . metFORMIN (GLUCOPHAGE) 1000 MG tablet TAKE 1 TABLET (1,000 MG TOTAL) BY MOUTH 2 (TWO) TIMES DAILY. 60 tablet 5  . Multiple Vitamins-Minerals (MULTIVITAMIN WITH MINERALS) tablet Take 1 tablet by mouth daily.    . BD INSULIN SYRINGE ULTRAFINE 31G X 15/64" 0.3 ML MISC USE TO GIVE 10-15 UNITS 3 (THREE) TIMES DAILY BEFORE MEALS. 100 each 11  . CRESTOR 10 MG tablet TAKE 1 TABLET (10 MG TOTAL) BY MOUTH DAILY. 30 tablet 5  . NOVOLOG 100 UNIT/ML injection INJECT 10-15 UNITS INTO THE SKIN 3 (THREE) TIMES DAILY BEFORE MEALS. 30 mL 2   No current facility-administered medications for this visit.   Social History   Social History  . Marital Status: Divorced    Spouse Name: N/A  . Number of Children: N/A  . 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 Smoking status: Current Every Day Smoker -- 0.05 packs/day for 18 years    Types: Cigarettes  . Smokeless tobacco: Never Used  . Alcohol Use: 0.0 oz/week    0 Standard drinks or equivalent per week     Comment: maybe 1-2 drinks per yr  . Drug Use: No  . Sexual Activity: No   Other Topics Concern  . Not on file   Social History Narrative   Marital status: Divorced after 25 years of marriage; not dating.      Children:  None       Lives: alone.       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  Loves work      Tobacco: 5 cigarettes x 20 years.      Alcohol:  None      Drugs: none       Education: [employer].       Exercise: No.      Seatbelt: 100%      Guns: unloaded.      Sexually active: none; total partners = 5.  Last STD screen 2006.     Family History  Problem Relation Age of Onset  . Diabetes Mother   . Hypertension Mother   . Ovarian cancer Mother 66  . Breast cancer Mother 17  . Diabetes Father   .  Heart disease Father 84    CAD/CABG; AAA; CHF  . Hyperlipidemia Father   . Hypertension Father   . Prostate cancer Father     dx. 49-83  . Colon polyps Father     section of colon removed; unsure of #  . Heart disease Brother     Died of CHF at age 55.  Marland Kitchen Hyperlipidemia Brother   . Hypertension Brother   . Congestive  Heart Failure Brother   . Heart disease Sister 22    3-vessel CAD s/p 1 stenting  . Diabetes Sister   . Hyperlipidemia Sister   . Hypertension Sister   . Cancer Brother 59    GI- esophageal; smoker  . Heart disease Brother     MIs in 36s and CABG.  . Diabetes Brother   . Hypertension Brother   . Stroke Maternal Grandmother   . Skin cancer Maternal Grandfather     multiple - unknown types; dx. 75s  . Heart Problems Paternal Grandfather   . Skin cancer Maternal Aunt     multiple - unknown type; dx. 86s  . Pancreatic cancer Maternal Uncle     dx. 70s       Objective:    BP 134/78 mmHg  Pulse 87  Temp(Src) 99 F (37.2 C) (Oral)  Resp 16  Ht 5\' 3"  (1.6 m)  Wt 212 lb 6.4 oz (96.344 kg)  BMI 37.63 kg/m2  SpO2 97% Physical Exam  Constitutional: She is oriented to person, place, and time. She appears well-developed and well-nourished. No distress.  HENT:  Head: Normocephalic and atraumatic.  Right Ear: External ear normal.  Left Ear: External ear normal.  Nose: Nose normal.  Mouth/Throat: Oropharynx is clear and moist.  Eyes: Conjunctivae and EOM are normal. Pupils are equal, round, and reactive to light.  Neck: Normal range of motion. Neck supple. Carotid bruit is not present. No thyromegaly present.  Cardiovascular: Normal rate, regular rhythm, normal heart sounds and intact distal pulses.  Exam reveals no gallop and no friction rub.   No murmur heard. Pulmonary/Chest: Effort normal and breath sounds normal. She has no wheezes. She has no rales.  Abdominal: Soft. Bowel sounds are normal. She exhibits no distension and no mass. There is no tenderness.  There is no rebound and no guarding.  Genitourinary: Vagina normal.  Lymphadenopathy:    She has no cervical adenopathy.  Neurological: She is alert and oriented to person, place, and time. No cranial nerve deficit.  Skin: Skin is warm and dry. No rash noted. She is not diaphoretic. No erythema. No pallor.  Psychiatric: She has a normal mood and affect. Her behavior is normal.   Results for orders placed or performed in visit on 05/31/15  Comprehensive metabolic panel  Result Value Ref Range   Sodium 139 135 - 145 mEq/L   Potassium 4.1 3.5 - 5.3 mEq/L   Chloride 99 96 - 112 mEq/L   CO2 27 19 - 32 mEq/L   Glucose, Bld 105 (H) 70 - 99 mg/dL   BUN 15 6 - 23 mg/dL   Creat 0.82 0.50 - 1.10 mg/dL   Total Bilirubin 0.5 0.2 - 1.2 mg/dL   Alkaline Phosphatase 54 39 - 117 U/L   AST 22 0 - 37 U/L   ALT 24 0 - 35 U/L   Total Protein 6.9 6.0 - 8.3 g/dL   Albumin 4.0 3.5 - 5.2 g/dL   Calcium 9.3 8.4 - 10.5 mg/dL  HIV antibody  Result Value Ref Range   HIV 1&2 Ab, 4th Generation NONREACTIVE NONREACTIVE  POCT glycosylated hemoglobin (Hb A1C)  Result Value Ref Range   Hemoglobin A1C 7.8   POCT glucose (manual entry)  Result Value Ref Range   POC Glucose 126 (A) 70 - 99 mg/dl       Assessment & Plan:   1. DM (diabetes mellitus) type II controlled, neurological manifestation   2. Family history of breast  cancer in mother   3. Family history of ovarian cancer   4. TOBACCO ABUSE   5. Screening for HIV (human immunodeficiency virus)   6. Essential hypertension   7. Hyperlipidemia   8. Obstructive sleep apnea    1. DMII with third nerve palsy: uncontrolled but improving; refer for diabetic nutrition education; obtain labs.  Increase Lantus to 50 units bid.   2.  Family history of breast cancer and ovarian cancer in mother: refer to genetics. 3.  Tobacco abuse: highly encourage cessation. 4.  Screening HIV: obtain HIV per current CDC guidelines. 5.  HTN: improved control with increase in  medication at last visit; obtain labs; continue current medications. 6. Hyperlipidemia: controlled;continue current medications. 7. OSA with CPAP: stable; needs new mask. 8. Colon cancer screening: needs to schedule. Will place referral.   Orders Placed This Encounter  Procedures  . Comprehensive metabolic panel  . HIV antibody  . Ambulatory referral to Nutrition and Diabetic Education    Referral Priority:  Routine    Referral Type:  Consultation    Referral Reason:  Specialty Services Required    Number of Visits Requested:  1  . Ambulatory referral to Genetics    Referral Priority:  Routine    Referral Type:  Consultation    Referral Reason:  Specialty Services Required    Number of Visits Requested:  1  . POCT glycosylated hemoglobin (Hb A1C)  . POCT glucose (manual entry)    Meds ordered this encounter  Medications  . glucose blood (ONE TOUCH ULTRA TEST) test strip    Sig: TESTING TWICE DAILY    Dispense:  100 each    Refill:  3    CYCLE FILL MEDICATION. Authorization is required for next refill.  . Insulin Pen Needle (BD PEN NEEDLE NANO U/F) 32G X 4 MM MISC    Sig: USE AS DIRECTED    Dispense:  100 each    Refill:  11  . metFORMIN (GLUCOPHAGE) 1000 MG tablet    Sig: TAKE 1 TABLET (1,000 MG TOTAL) BY MOUTH 2 (TWO) TIMES DAILY.    Dispense:  60 tablet    Refill:  5    CYCLE FILL MEDICATION. Authorization is required for next refill.  . Insulin Glargine (LANTUS SOLOSTAR) 100 UNIT/ML Solostar Pen    Sig: INJECT 50 UNITS INTO THE SKIN 2 (TWO) TIMES DAILY.    Dispense:  30 mL    Refill:  11    Return in about 3 months (around 08/31/2015) for recheck.    Higinio Grow Elayne Guerin, M.D. [facility] [address] [place],   [address] [phone] phone [phone] fax

## 2015-05-31 NOTE — Patient Instructions (Signed)
Diabetes and Exercise Exercising regularly is important. It is not just about losing weight. It has many health benefits, such as:  Improving your overall fitness, flexibility, and endurance.  Increasing your bone density.  Helping with weight control.  Decreasing your body fat.  Increasing your muscle strength.  Reducing stress and tension.  Improving your overall health. People with diabetes who exercise gain additional benefits because exercise:  Reduces appetite.  Improves the body's use of blood sugar (glucose).  Helps lower or control blood glucose.  Decreases blood pressure.  Helps control blood lipids (such as cholesterol and triglycerides).  Improves the body's use of the hormone insulin by:  Increasing the body's insulin sensitivity.  Reducing the body's insulin needs.  Decreases the risk for heart disease because exercising:  Lowers cholesterol and triglycerides levels.  Increases the levels of good cholesterol (such as high-density lipoproteins [HDL]) in the body.  Lowers blood glucose levels. YOUR ACTIVITY PLAN  Choose an activity that you enjoy and set realistic goals. Your health care provider or diabetes educator can help you make an activity plan that works for you. Exercise regularly as directed by your health care provider. This includes:  Performing resistance training twice a week such as push-ups, sit-ups, lifting weights, or using resistance bands.  Performing 150 minutes of cardio exercises each week such as walking, running, or playing sports.  Staying active and spending no more than 90 minutes at one time being inactive. Even short bursts of exercise are good for you. Three 10-minute sessions spread throughout the day are just as beneficial as a single 30-minute session. Some exercise ideas include:  Taking the dog for a walk.  Taking the stairs instead of the elevator.  Dancing to your favorite song.  Doing an exercise  video.  Doing your favorite exercise with a friend. RECOMMENDATIONS FOR EXERCISING WITH TYPE 1 OR TYPE 2 DIABETES   Check your blood glucose before exercising. If blood glucose levels are greater than 240 mg/dL, check for urine ketones. Do not exercise if ketones are present.  Avoid injecting insulin into areas of the body that are going to be exercised. For example, avoid injecting insulin into:  The arms when playing tennis.  The legs when jogging.  Keep a record of:  Food intake before and after you exercise.  Expected peak times of insulin action.  Blood glucose levels before and after you exercise.  The type and amount of exercise you have done.  Review your records with your health care provider. Your health care provider will help you to develop guidelines for adjusting food intake and insulin amounts before and after exercising.  If you take insulin or oral hypoglycemic agents, watch for signs and symptoms of hypoglycemia. They include:  Dizziness.  Shaking.  Sweating.  Chills.  Confusion.  Drink plenty of water while you exercise to prevent dehydration or heat stroke. Body water is lost during exercise and must be replaced.  Talk to your health care provider before starting an exercise program to make sure it is safe for you. Remember, almost any type of activity is better than none. Document Released: 01/18/2004 Document Revised: 03/14/2014 Document Reviewed: 04/06/2013 Exit[employer] Patient Information 2015 [employer]. This information is not intended to replace advice given to you by your health care provider. Make sure you discuss any questions you have with your health care provider.  

## 2015-06-01 ENCOUNTER — Telehealth: Payer: Self-pay | Admitting: Family Medicine

## 2015-06-01 LAB — HIV ANTIBODY (ROUTINE TESTING W REFLEX): HIV: NONREACTIVE

## 2015-06-01 NOTE — Telephone Encounter (Signed)
Left a message for patient to return call.  She is scheduled for a visit with Dr. Tamala Julian on 06/02/15 and had just seen Dr. Tamala Julian on 05/31/15.  Dr. Tamala Julian said that she does not need to come in for the appointment and to please cancel the appointment and make sure the patient is aware that she does not need to come in,.

## 2015-06-02 ENCOUNTER — Ambulatory Visit: Payer: BC Managed Care – PPO | Admitting: Family Medicine

## 2015-06-05 ENCOUNTER — Telehealth: Payer: Self-pay | Admitting: Genetic Counselor

## 2015-06-05 NOTE — Telephone Encounter (Signed)
genetic appt-s/w patient and gave genetic appt for 08/04 @ 10 w/[employer]

## 2015-06-09 ENCOUNTER — Other Ambulatory Visit: Payer: Self-pay | Admitting: Family Medicine

## 2015-06-15 ENCOUNTER — Encounter: Payer: BC Managed Care – PPO | Admitting: Genetic Counselor

## 2015-06-15 ENCOUNTER — Other Ambulatory Visit: Payer: BC Managed Care – PPO

## 2015-06-16 ENCOUNTER — Ambulatory Visit: Payer: BC Managed Care – PPO | Admitting: Dietician

## 2015-06-29 ENCOUNTER — Other Ambulatory Visit: Payer: BC Managed Care – PPO

## 2015-06-29 ENCOUNTER — Encounter: Payer: Self-pay | Admitting: Genetic Counselor

## 2015-06-29 ENCOUNTER — Ambulatory Visit (HOSPITAL_BASED_OUTPATIENT_CLINIC_OR_DEPARTMENT_OTHER): Payer: BC Managed Care – PPO | Admitting: Genetic Counselor

## 2015-06-29 DIAGNOSIS — Z8041 Family history of malignant neoplasm of ovary: Secondary | ICD-10-CM | POA: Diagnosis not present

## 2015-06-29 DIAGNOSIS — Z800 Family history of malignant neoplasm of digestive organs: Secondary | ICD-10-CM | POA: Diagnosis not present

## 2015-06-29 DIAGNOSIS — Z803 Family history of malignant neoplasm of breast: Secondary | ICD-10-CM

## 2015-06-29 DIAGNOSIS — Z315 Encounter for genetic counseling: Secondary | ICD-10-CM

## 2015-06-29 DIAGNOSIS — Z8371 Family history of colonic polyps: Secondary | ICD-10-CM | POA: Diagnosis not present

## 2015-06-29 DIAGNOSIS — Z8042 Family history of malignant neoplasm of prostate: Secondary | ICD-10-CM

## 2015-06-29 DIAGNOSIS — Z808 Family history of malignant neoplasm of other organs or systems: Secondary | ICD-10-CM

## 2015-06-29 NOTE — Progress Notes (Signed)
REFERRING PROVIDER: Wardell Honour, MD [address] [place], [place] [address]  PRIMARY PROVIDER:  Reginia Forts, MD  PRIMARY REASON FOR VISIT:  1. Family history of breast cancer in mother   2. Family history of ovarian cancer   3. Family history of pancreatic cancer   4. Family history of prostate cancer in father   50. Family history of colonic polyps   6. Family history of skin cancer   7. Family history of esophageal cancer      HISTORY OF PRESENT ILLNESS:   [name], a 54 y.o. female, was seen for a [place] cancer genetics consultation at the request of Dr. Tamala Julian due to a family history of breast and ovarian cancer in her mother, as well as additional family history of cancer.  [name] presents to clinic today to discuss the possibility of a hereditary predisposition to cancer, genetic testing, and to further clarify her future cancer risks, as well as potential cancer risks for family members.   [name] is a 54 y.o. female with no personal history of cancer.  [name] mother died of ovarian cancer at the age of 12; prior to that she had a history of breast cancer, diagnosed at 25.    CANCER HISTORY:   No history exists.     HORMONAL RISK FACTORS:  Menarche was at age 3.  First live birth - no children. OCP use for approximately 3 years.  Ovaries intact: yes.  Hysterectomy: no.  Menopausal status: postmenopausal.  HRT use: 0 years. Colonoscopy: no; not examined. Mammogram within the last year: typically gets them annually, but is overdue for one this year. Number of breast biopsies: 0. Up to date with pelvic exams:  yes. Any excessive radiation exposure in the past:  no  Past Medical History  Diagnosis Date  . Diabetes mellitus without complication   . Hypertension   . Hyperlipidemia   . OSA (obstructive sleep apnea) 11/11/2006    CPAP  . Heart murmur   . Glaucoma   . Allergy     Zyrtec daily.  . Arthritis     DDD lumbar spine  . Asthma      Albuterol seasonally.  . Thyroid disease     Thyroid nodules s/p needle biopsy negative.    . Neuromuscular disorder     R third nerve palsy    Past Surgical History  Procedure Laterality Date  . Cholecystectomy open  1979    Social History   Social History  . Marital Status: Divorced    Spouse Name: N/A  . Number of Children: N/A  . 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 Smoking status: Current Every Day Smoker -- 0.05 packs/day for 18 years    Types: Cigarettes  . Smokeless tobacco: Never Used  . Alcohol Use: 0.0 oz/week    0 Standard drinks or equivalent per week     Comment: maybe 1-2 drinks per yr  . Drug Use: No  . Sexual Activity: No   Other Topics Concern  . None   Social History Narrative   Marital status: Divorced after 25 years of marriage; not dating.      Children:  None       Lives: alone.       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  Loves work      Tobacco: 5  cigarettes x 20 years.      Alcohol:  None      Drugs: none       Education: [employer].       Exercise: No.      Seatbelt: 100%      Guns: unloaded.      Sexually active: none; total partners = 5.  Last STD screen 2006.       FAMILY HISTORY:  We obtained a detailed, 4-generation family history.  Significant diagnoses are listed below: Family History  Problem Relation Age of Onset  . Diabetes Mother   . Hypertension Mother   . Ovarian cancer Mother 66  . Breast cancer Mother 70  . Diabetes Father   . Heart disease Father 82    CAD/CABG; AAA; CHF  . Hyperlipidemia Father   . Hypertension Father   . Prostate cancer Father     dx. 39-83  . Colon polyps Father     section of colon removed; unsure of #  . Heart disease Brother     Died of CHF at age 65.  Marland Kitchen Hyperlipidemia Brother   . Hypertension Brother   . Congestive Heart Failure Brother   . Heart disease  Sister 72    3-vessel CAD s/p 1 stenting  . Diabetes Sister   . Hyperlipidemia Sister   . Hypertension Sister   . Cancer Brother 66    GI- esophageal; smoker  . Heart disease Brother     MIs in 24s and CABG.  . Diabetes Brother   . Hypertension Brother   . Stroke Maternal Grandmother   . Skin cancer Maternal Grandfather     multiple - unknown types; dx. 102s  . Heart Problems Paternal Grandfather   . Skin cancer Maternal Aunt     multiple - unknown type; dx. 37s  . Pancreatic cancer Maternal Uncle     dx. 44s    [name] has one full sister, age 105, who has never had cancer.  [name] had two full brothers (fraternal twins) who have now passed away.  One brother died at 26 of congestive heart failure.  The other brother was diagnosed with esophageal cancer that may have spread to the stomach and he passed away around 65-66.  This brother was a smoker.  [name] has two nieces and two nephews currently living.  One nephew passed away at 108 from potentially an aortic dissection.  His daughter died at 52 in a car accident.  [name] mother was diagnosed with breast cancer at the age of 67, and she died of ovarian cancer at the age of 46.  [name] father had a history of prostate cancer, diagnosed around 62.  He passed away at 54.  He also had a history of colon polyps and had a section of his colon removed.  [name] is unaware of how many polyps he had, but reports that he did not have colon cancer.    There is a maternal family history of cancer.  [name] mother had at least two full sisters and two full brothers.  [name] believes she likely had 2-3 more siblings, but she does not have any further information for these relatives.  One maternal aunt died in her 65s; the other had a history of multiple skin cancers beginning in her 60s.  This sister died in her early 68s.  On maternal uncle was diagnosed with pancreatic cancer in his 43s.  He had one  son and one  daughter, but [name] has no information for these cousins.  Another maternal uncle died in his mid-late 77s coming home from [employer] service.  This uncle had no children. [name] has no further information for any of her maternal cousins.  Her grandmother died in her late 64s with heart problems.  Her grandfather died in his early 11s; he had a history of skin cancers first diagnosed in his 72s.  Ms. Goosby father had two full sisters and one brother.  One sister died in her teens or earlier from an unspecified cause.  The other paternal aunt died in her early 42L following complications from bariatric surgery performed in the 1960s.  Ms. Herskowitz uncle passed away in his late 64s-early 90s.  Her paternal grandmother died in her late 32s and her grandfather died of heart problems in his early 65s.  Ms. Kendle is unaware of any cancer diagnoses for these relatives or for any of her paternal cousins.    Patient's maternal ancestors are of Caucasian/Native American/German descent, and paternal ancestors are of Caucasian/German descent. There is no reported Ashkenazi Jewish ancestry. There is no known consanguinity.  GENETIC COUNSELING ASSESSMENT: Azusena Erlandson is a 54 y.o. female with a family history of cancer which is somewhat suggestive of a hereditary cancer syndrome and predisposition to cancer. We, therefore, discussed and recommended the following at today's visit.   DISCUSSION: We reviewed the characteristics, features and inheritance patterns of hereditary cancer syndromes, particularly those caused by mutations in the BRCA1/2 genes, considering Ms. Blower's mother's history of breast and ovarian cancer.  We also discussed the features and inheritance patterns of Lynch syndrome as well as some polyposis conditions based upon her father's history of colon polyps and partial colectomy.  We also discussed genetic testing, including the appropriate family members to test, the process of  testing, insurance coverage and turn-around-time for results. We discussed the implications of a negative, positive and/or variant of uncertain significant result.  Ms. Hampshire is aware that, even if she tests negative, we would still recommend testing for her sister and/or her nieces and nephews.  Because Ms. Kleine herself has not had cancer, we cannot be sure with a negative result that there is no hereditary cancer syndrome in the family (because she herself might not have inherited it).  We recommended Ms. Roots pursue genetic testing for the 32-gene CancerNext Panel through [facility] [place]  The 32-gene CancerNext Panel offered by [employer] includes sequencing and deletion/duplication analysis for the following 30 genes: APC, ATM, BARD1, BRCA1, BRCA2, BRIP1, BMPR1A, CDH1, CDK4, CDKN2A, CHEK2, MLH1, MRE11A, MSH2, MSH6, MUTYH, NBN, NF1, PALB2, PMS2, POLD1, POLE, PTEN, RAD50, RAD51C, RAD51D, SMAD4, SMARCA4, STK11, and TP53.  This panel also includes deletion/duplication analysis (without sequencing) for two genes, EPCAM and GREM1/SCG5.    Based on Ms. Strick's family history of cancer, she meets medical criteria for genetic testing. Despite that she meets criteria, she may still have an out of pocket cost. We discussed that if her out of pocket cost for testing is over $100, the laboratory will call and confirm whether she wants to proceed with testing.  If the out of pocket cost of testing is less than $100 she will be billed by the genetic testing laboratory.   Based on the patient's personal and family history, the [facility] and literature data were used to estimate her risk of developing breast cancer. These estimate her lifetime risk  of developing breast cancer to be approximately 16.6% to 18.5%. This estimation does not take into account any genetic testing results.  The patient's lifetime breast cancer risk is a preliminary estimate  based on available information using one of several models endorsed by the [place] (ACS). The ACS recommends consideration of breast MRI screening as an adjunct to mammography for patients at high risk (defined as 20% or greater lifetime risk). A more detailed breast cancer risk assessment can be considered, if clinically indicated.   PLAN: After considering the risks, benefits, and limitations, Ms. Thomley  provided informed consent to pursue genetic testing and the blood sample was sent to [facility] for analysis of the 32-gene CancerNext Panel test. Results should be available within approximately 3-4 weeks' time, at which point they will be disclosed by telephone to Ms. Bisono, as will any additional recommendations warranted by these results. Ms. Biswell will receive a summary of her genetic counseling visit and a copy of her results once available. This information will also be available in Epic. We encouraged Ms. Dancy to remain in contact with cancer genetics annually so that we can continuously update the family history and inform her of any changes in cancer genetics and testing that may be of benefit for her family. Ms. Colborn questions were answered to her satisfaction today. Our contact information was provided should additional questions or concerns arise.  Thank you for the referral and allowing [place] to share in the care of your patient.   Jeanine Luz, MS Genetic Counselor [email or web address] Phone: [phone]  The patient was seen for a total of 60 minutes in face-to-face genetic counseling.  This patient was discussed with Drs. Magrinat, Lindi Adie and/or Burr Medico who agrees with the above.    _______________________________________________________________________ For Office Staff:  Number of people involved in session: 1 Was an Intern/ student involved with case: no

## 2015-06-30 ENCOUNTER — Telehealth: Payer: Self-pay | Admitting: Genetic Counselor

## 2015-06-30 NOTE — Telephone Encounter (Signed)
Called Madison Cowan to ask a question in follow-up from yesterday's appt.  Left a message asking her to call me back at [phone].

## 2015-07-03 NOTE — Telephone Encounter (Signed)
Did not recall [place] discussing the [place] law and how life insurance is not included under that policy.  Sometimes patients, like Ms. Todt who do not have any personal history of cancer, ask this question.  Most employers and health insurance companies cannot discriminate against patients regarding their genetic testing status, but life insurance companies are not covered under the [employer].  Thus, if Ms. Reddick wanted to get more information about life insurance or wanted to look into life insurance policies or increasing her coverage, we could always delay or hold her test results that way she would not have those results back prior to any type of insurance review process.  Ms. Schwenn reports that she already has life insurance through work and thus is not concerned about how this might affect her following any potentially positive genetic test result.  She is welcome to call me with any further questions.

## 2015-07-21 ENCOUNTER — Telehealth: Payer: Self-pay | Admitting: Genetic Counselor

## 2015-07-21 ENCOUNTER — Ambulatory Visit: Payer: Self-pay | Admitting: Genetic Counselor

## 2015-07-21 DIAGNOSIS — Z1379 Encounter for other screening for genetic and chromosomal anomalies: Secondary | ICD-10-CM

## 2015-07-24 NOTE — Telephone Encounter (Signed)
Discussed with [name] that her genetic test results were negative for pathogenic mutations within any of 32 genes related to increased risks for breast, ovarian, colon, or other related cancers.  We still do not have an explanation for the family history of cancer, but this result is likely reassuring for [name]'s own hereditary cancer risk.  Most cancers are not genetic, but there is still the possibility that [name]'s mother might have had a hereditary cancer syndrome that caused her to have both breast and ovarian cancer and that that was just not passed on to [name].  Therefore, [name]'s sister would still be eligible for genetic counseling and testing, if interested, as would her nieces and nephews.  We discussed that [name] should have a colonoscopy soon, since she has not had one yet and is of the age to do so.  Her father had a history of multiple colon polyps as well, so this family history makes colonoscopy screening even more important for [name].  Women in the family are at an increased risk for breast cancer based on the family history of breast cancer and should begin mammogram screening at age 56.  [name] reports that she does not believe her sister is undergoing mammogram screening yet; she should do so.  Both of [name]'s nieces are also able to begin mammogram screening if they have not done so yet.  She and her relatives are welcome to call me with any further questions.

## 2015-07-28 DIAGNOSIS — Z1379 Encounter for other screening for genetic and chromosomal anomalies: Secondary | ICD-10-CM | POA: Insufficient documentation

## 2015-07-28 DIAGNOSIS — Z8371 Family history of colonic polyps: Secondary | ICD-10-CM | POA: Insufficient documentation

## 2015-07-28 NOTE — Progress Notes (Signed)
GENETIC TEST RESULTS  HPI: Madison Cowan was previously seen in the [facility] clinic due to a family history of breast, ovarian, and other cancers and concerns regarding a hereditary predisposition to cancer. Please refer to our prior cancer genetics clinic note from June 29, 2015 for more information regarding Madison Cowan medical, social and family histories, and our assessment and recommendations, at the time. Madison Cowan recent genetic test results were disclosed to her, as were recommendations warranted by these results. These results and recommendations are discussed in more detail below.  GENETIC TEST RESULTS: At the time of Madison Cowan visit on 06/27/15, we recommended she pursue genetic testing of the 32-gene CancerNext Panel through [employer] [facility], [place]).  The CancerNext Panel offered by [employer] includes sequencing and deletion/duplication analysis of the following 30 genes: APC, ATM, BARD1, BRCA1, BRCA2, BRIP1, BMPR1A, CDH1, CDK4, CDKN2A, CHEK2, MLH1, MRE11A, MSH2, MSH6, MUTYH, NBN, NF1, PALB2, PMS2, POLD1, POLE, PTEN, RAD50, RAD51C, RAD51D, SMAD4, SMARCA4, STK11, and TP53.  This panel also includes deletion/duplication analysis (without sequencing) for two genes, EPCAM and GREM1/SCG5.   Those results are now back, there report date for which is July 19, 2015.  Genetic testing was normal, and did not reveal a deleterious mutation in these genes.  Additionally, no variants of uncertain significance (VUSs) were found.  The test report will be scanned into EPIC and will be located under the Results Review tab in the Molecular Pathology section.   We discussed with Madison Cowan that since the current genetic testing is not perfect, it is possible there may be a gene mutation in one of these genes that current testing cannot detect, but that chance is small. We also discussed, that it is possible that another gene that has not yet been  discovered, or that we have not yet tested, is responsible for the cancer diagnoses in the family, and it is, therefore, important to remain in touch with cancer genetics in the future so that we can continue to offer Madison Cowan the most up to date genetic testing.   CANCER SCREENING RECOMMENDATIONS: This normal result is reassuring and indicates that Madison Cowan does not likely have an increased risk of cancer due to a mutation in one of these genes.  We, therefore, recommended  Madison Cowan continue to follow the cancer screening guidelines provided by her primary healthcare providers.  Most cancer is not genetic, so it may be that the family history of cancer is just caused by chance.  However, there is still the possibility that Madison Cowan mother might have had a hereditary cancer syndrome that Madison Cowan herself did not inherit, especially since Madison Cowan herself has not had cancer.     RECOMMENDATIONS FOR FAMILY MEMBERS: Women in this family might be at some increased risk of developing cancer, over the general population risk, simply due to the family history of cancer. We recommended women in this family have a yearly mammogram beginning at age 50, or 83 years younger than the earliest onset of cancer, an an annual clinical breast exam, and perform monthly breast self-exams. Madison Cowan reports that her sister may not be having annual mammograms so we discussed the importance of her doing so.  Additionally, both of Madison Cowan nieces are of the age that the could begin mammogram screening if they have not done so.  Women in this family should also have a gynecological exam as recommended by their primary provider. All family members should  have a colonoscopy by age 8.  We discussed the fact that Madison Cowan. Holsopple has not yet had a colonoscopy and that she should schedule one soon, especially since her father had a history of multiple colon polyps.  Based on Madison Cowan. Kobus's family history, we  recommended her sister have genetic counseling and testing.  It is unlikely that she would test positive because there are several maternal relatives who lived to later ages in life and did not have cancer.  However, she would still meet criteria based on her mother's history of ovarian and breast cancer.  Additionally, Madison Cowan. Loughmiller's niece and nephew (through her brother who is deceased) would also be eligible for genetic counseling and testing, if interested.  Madison Cowan. Blizard will let us know if we can be of any assistance in coordinating genetic counseling and/or testing for these family members.   FOLLOW-UP: Lastly, we discussed with Madison Cowan. Ramnauth that cancer genetics is a rapidly advancing field and it is possible that new genetic tests will be appropriate for her and/or her family members in the future. We encouraged her to remain in contact with cancer genetics on an annual basis so we can update her personal and family histories and let her know of advances in cancer genetics that may benefit this family.   Our contact number was provided. Madison Cowan. Sherrill questions were answered to her satisfaction, and she knows she is welcome to call us at anytime with additional questions or concerns.   Jeanine Luz Madison Cowan Genetic Counselor [email or web address] Phone: [phone]

## 2015-09-01 ENCOUNTER — Encounter: Payer: Self-pay | Admitting: Family Medicine

## 2015-09-06 ENCOUNTER — Ambulatory Visit: Payer: BC Managed Care – PPO | Admitting: Family Medicine

## 2015-09-08 ENCOUNTER — Ambulatory Visit (INDEPENDENT_AMBULATORY_CARE_PROVIDER_SITE_OTHER): Payer: BC Managed Care – PPO | Admitting: Family Medicine

## 2015-09-08 ENCOUNTER — Encounter: Payer: Self-pay | Admitting: Family Medicine

## 2015-09-08 VITALS — BP 124/83 | HR 96 | Temp 98.6°F | Resp 16 | Ht 63.5 in | Wt 215.2 lb

## 2015-09-08 DIAGNOSIS — E1142 Type 2 diabetes mellitus with diabetic polyneuropathy: Secondary | ICD-10-CM | POA: Diagnosis not present

## 2015-09-08 DIAGNOSIS — Z794 Long term (current) use of insulin: Secondary | ICD-10-CM

## 2015-09-08 DIAGNOSIS — Z9989 Dependence on other enabling machines and devices: Secondary | ICD-10-CM

## 2015-09-08 DIAGNOSIS — Z23 Encounter for immunization: Secondary | ICD-10-CM | POA: Diagnosis not present

## 2015-09-08 DIAGNOSIS — I10 Essential (primary) hypertension: Secondary | ICD-10-CM

## 2015-09-08 DIAGNOSIS — Z8041 Family history of malignant neoplasm of ovary: Secondary | ICD-10-CM | POA: Diagnosis not present

## 2015-09-08 DIAGNOSIS — E785 Hyperlipidemia, unspecified: Secondary | ICD-10-CM

## 2015-09-08 DIAGNOSIS — G4733 Obstructive sleep apnea (adult) (pediatric): Secondary | ICD-10-CM

## 2015-09-08 DIAGNOSIS — Z803 Family history of malignant neoplasm of breast: Secondary | ICD-10-CM

## 2015-09-08 LAB — LIPID PANEL
Cholesterol: 162 mg/dL (ref 125–200)
HDL: 31 mg/dL — AB (ref >=46)
LDL CALC: 89 mg/dL (ref <130)
Total CHOL/HDL Ratio: 5.2 Ratio — ABNORMAL HIGH (ref <=5.0)
Triglycerides: 212 mg/dL — ABNORMAL HIGH (ref <150)
VLDL: 42 mg/dL — ABNORMAL HIGH (ref <30)

## 2015-09-08 LAB — CBC WITH DIFFERENTIAL/PLATELET
Basophils Absolute: 0 10*3/uL (ref 0.0–0.1)
Basophils Relative: 0 % (ref 0–1)
EOS ABS: 0.3 10*3/uL (ref 0.0–0.7)
EOS PCT: 3 % (ref 0–5)
HCT: 40.6 % (ref 36.0–46.0)
Hemoglobin: 14.2 g/dL (ref 12.0–15.0)
Lymphocytes Relative: 34 % (ref 12–46)
Lymphs Abs: 3.4 10*3/uL (ref 0.7–4.0)
MCH: 29.3 pg (ref 26.0–34.0)
MCHC: 35 g/dL (ref 30.0–36.0)
MCV: 83.9 fL (ref 78.0–100.0)
MPV: 10.4 fL (ref 8.6–12.4)
Monocytes Absolute: 0.7 10*3/uL (ref 0.1–1.0)
Monocytes Relative: 7 % (ref 3–12)
Neutro Abs: 5.7 10*3/uL (ref 1.7–7.7)
Neutrophils Relative %: 56 % (ref 43–77)
Platelets: 258 10*3/uL (ref 150–400)
RBC: 4.84 MIL/uL (ref 3.87–5.11)
RDW: 13.6 % (ref 11.5–15.5)
WBC: 10.1 10*3/uL (ref 4.0–10.5)

## 2015-09-08 LAB — COMPREHENSIVE METABOLIC PANEL
ALT: 25 U/L (ref 6–29)
AST: 21 U/L (ref 10–35)
Albumin: 4.1 g/dL (ref 3.6–5.1)
Alkaline Phosphatase: 61 U/L (ref 33–130)
BILIRUBIN TOTAL: 0.5 mg/dL (ref 0.2–1.2)
BUN: 17 mg/dL (ref 7–25)
CHLORIDE: 101 mmol/L (ref 98–110)
CO2: 28 mmol/L (ref 20–31)
CREATININE: 0.89 mg/dL (ref 0.50–1.05)
Calcium: 9.3 mg/dL (ref 8.6–10.4)
GLUCOSE: 173 mg/dL — AB (ref 65–99)
Potassium: 4.4 mmol/L (ref 3.5–5.3)
Sodium: 137 mmol/L (ref 135–146)
Total Protein: 6.6 g/dL (ref 6.1–8.1)

## 2015-09-08 LAB — POCT GLYCOSYLATED HEMOGLOBIN (HGB A1C): HEMOGLOBIN A1C: 8

## 2015-09-08 MED ORDER — INSULIN GLARGINE 100 UNIT/ML SOLOSTAR PEN: PEN_INJECTOR | SUBCUTANEOUS | Status: DC

## 2015-09-08 MED ORDER — "INSULIN SYRINGE-NEEDLE U-100 31G X 5/16"" 0.5 ML MISC": 1.0000 | Freq: Three times a day (TID) | Status: DC

## 2015-09-08 MED ORDER — ROSUVASTATIN CALCIUM 10 MG PO TABS: 10.0000 mg | ORAL_TABLET | Freq: Every day | ORAL | Status: DC

## 2015-09-08 NOTE — Progress Notes (Signed)
Subjective:    Patient ID: Madison Cowan, female    DOB: 04/19/1961, 54 y.o.   MRN: [id number]  09/08/2015  follow up diabetes; Hyperlipidemia; and Hypertension   HPI This 54 y.o. female presents for three month follow-up:  1. DMII: HgbA1c 7.8 at last visit.  Sugars probably going to be up; working until 8:00pm every night.  Moved 1.5 weeks before school started; wonderful building but created chaos.  Pressure of work. First part of year is crazy.  Buys things to cook after work but does not cook when gets home late.   Metformin 1000mg  bid; most of time remembers this.  Lantus 50 units am and pm. Novolog 10-15 units qac; 2-3 times per day.  Carb counts each meal to determine amount of insulin.   Checking sugars once per week; checks fasting; 115-180s.   Rare low sugars; one night at work, sugar did drop at 1:00am.  Sweating diffusely but cold.   Does 15 units most meals with Novolog.  2. R  Third nerve palsy: improving; not 100% at that time.  Does worsen with walking television.  Did suffer with L eye issues for two weeks with resolution.  3. Asthma: usually only uses Albuterol with acute illness; has allergies.  More cough.  Flonase never.  Needs to ask eye doctor about Flonase. Glaucoma suspect.    Genetic counseling: s/p consultation;   Plans to have colonoscopy in summer.    OSA: found new mask on line for CPAP.     Review of Systems  Constitutional: Negative for fever, chills, diaphoresis and fatigue.  Eyes: Positive for visual disturbance.  Respiratory: Negative for cough and shortness of breath.   Cardiovascular: Negative for chest pain, palpitations and leg swelling.  Gastrointestinal: Negative for nausea, vomiting, abdominal pain, diarrhea and constipation.  Endocrine: Negative for cold intolerance, heat intolerance, polydipsia, polyphagia and polyuria.  Neurological: Positive for numbness. Negative for dizziness, tremors, seizures, syncope, facial asymmetry, speech  difficulty, weakness, light-headedness and headaches.    Past Medical History  Diagnosis Date  . Diabetes mellitus without complication ([place])   . Hypertension   . Hyperlipidemia   . OSA (obstructive sleep apnea) 11/11/2006    CPAP  . Heart murmur   . Glaucoma   . Allergy     Zyrtec daily.  . Arthritis     DDD lumbar spine  . Asthma     Albuterol seasonally.  . Thyroid disease     Thyroid nodules s/p needle biopsy negative.    . Neuromuscular disorder ([place])     R third nerve palsy   Past Surgical History  Procedure Laterality Date  . Cholecystectomy open  1979   No Known Allergies  Social History   Social History  . Marital Status: Divorced    Spouse Name: N/A  . Number of Children: N/A  . 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 Smoking status: Current Every Day Smoker -- 0.05 packs/day for 18 years    Types: Cigarettes  . Smokeless tobacco: Never Used  . Alcohol Use: 0.0 oz/week    0 Standard drinks or equivalent per week     Comment: maybe 1-2 drinks per yr  . Drug Use: No  . Sexual Activity: No   Other Topics Concern  . Not on file   Social History Narrative   Marital status: Divorced after 25 years of marriage; not dating.  Children:  None       Lives: alone.       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  Loves work      Tobacco: 5 cigarettes x 20 years.      Alcohol:  None      Drugs: none       Education: [employer].       Exercise: No.      Seatbelt: 100%      Guns: unloaded.      Sexually active: none; total partners = 5.  Last STD screen 2006.     Family History  Problem Relation Age of Onset  . Diabetes Mother   . Hypertension Mother   . Ovarian cancer Mother 57  . Breast cancer Mother 74  . Diabetes Father   . Heart disease Father 32    CAD/CABG; AAA; CHF  . Hyperlipidemia Father   . Hypertension Father   .  Prostate cancer Father     dx. 80-83  . Colon polyps Father     section of colon removed; unsure of #  . Heart disease Brother     Died of CHF at age 57.  Marland Kitchen Hyperlipidemia Brother   . Hypertension Brother   . Congestive Heart Failure Brother   . Heart disease Sister 21    3-vessel CAD s/p 1 stenting  . Diabetes Sister   . Hyperlipidemia Sister   . Hypertension Sister   . Cancer Brother 51    GI- esophageal; smoker  . Heart disease Brother     MIs in 58s and CABG.  . Diabetes Brother   . Hypertension Brother   . Stroke Maternal Grandmother   . Skin cancer Maternal Grandfather     multiple - unknown types; dx. 91s  . Heart Problems Paternal Grandfather   . Skin cancer Maternal Aunt     multiple - unknown type; dx. 21s  . Pancreatic cancer Maternal Uncle     dx. 70s       Objective:    BP 124/83 mmHg  Pulse 96  Temp(Src) 98.6 F (37 C) (Oral)  Resp 16  Ht 5' 3.5" (1.613 m)  Wt 215 lb 3.2 oz (97.614 kg)  BMI 37.52 kg/m2 Physical Exam  Constitutional: She is oriented to person, place, and time. She appears well-developed and well-nourished. No distress.  HENT:  Head: Normocephalic and atraumatic.  Right Ear: External ear normal.  Left Ear: External ear normal.  Nose: Nose normal.  Mouth/Throat: Oropharynx is clear and moist.  Eyes: Conjunctivae and EOM are normal. Pupils are equal, round, and reactive to light.  Neck: Normal range of motion. Neck supple. Carotid bruit is not present. No thyromegaly present.  Cardiovascular: Normal rate, regular rhythm, normal heart sounds and intact distal pulses.  Exam reveals no gallop and no friction rub.   No murmur heard. Pulmonary/Chest: Effort normal and breath sounds normal. She has no wheezes. She has no rales.  Abdominal: Soft. Bowel sounds are normal. She exhibits no distension and no mass. There is no tenderness. There is no rebound and no guarding.  Lymphadenopathy:    She has no cervical adenopathy.  Neurological: She  is alert and oriented to person, place, and time. No cranial nerve deficit.  Skin: Skin is warm and dry. No rash noted. She is not diaphoretic. No erythema. No pallor.  Psychiatric: She has a normal mood and affect. Her behavior is normal.  PNEUMOVAX, INFLUENZA, HEPATITIS B#3 ADMINISTERED.     Assessment & Plan:   1. Controlled type 2 diabetes mellitus with diabetic polyneuropathy, with long-term current use of insulin ([place])   2. Hyperlipidemia   3. HYPERTENSION, BENIGN   4. Influenza vaccine needed   5. Need for hepatitis B vaccination    1. DMII with neuropathy: uncontrolled; increase Lantus to 54 units bid; continue Novolog 15 units qac.  2.  Hyperlipidemia: controlled; obtain labs; continue medications. 3.  HTN: controlled; obtain labs; continue current medication.s 4.  OSA; improved compliance with CPAP. 5. Family history of breast cancer: s/p genetic counseling with negative work up. 6.  S/p influenza vaccine. 7. S/p Pneumovax 8.  S/p Hepatitis B#3   Orders Placed This Encounter  Procedures  . Pneumococcal polysaccharide vaccine 23-valent greater than or equal to 2yo subcutaneous/IM  . Flu Vaccine QUAD 36+ mos IM  . Hepatitis B vaccine adult IM  . CBC with Differential/Platelet  . Comprehensive metabolic panel    Order Specific Question:  Has the patient fasted?    Answer:  Yes  . Lipid panel    Order Specific Question:  Has the patient fasted?    Answer:  Yes  . POCT glycosylated hemoglobin (Hb A1C)   Meds ordered this encounter  Medications  . Insulin Syringe-Needle U-100 (BD INSULIN SYRINGE ULTRAFINE) 31G X 5/16" 0.5 ML MISC    Sig: 1 Syringe by Does not apply route 3 (three) times daily.    Dispense:  100 each    Refill:  11  . Insulin Glargine (LANTUS SOLOSTAR) 100 UNIT/ML Solostar Pen    Sig: INJECT 54 UNITS INTO THE SKIN 2 (TWO) TIMES DAILY.    Dispense:  30 mL    Refill:  11  . rosuvastatin (CRESTOR) 10 MG tablet    Sig: Take 1 tablet (10 mg total) by  mouth daily.    Dispense:  30 tablet    Refill:  5    Return in about 3 months (around 12/09/2015) for recheck diabetes.     Santi Troung Elayne Guerin, M.D. [facility] [address] [place], [place]  [address] [phone] phone [phone] fax

## 2015-09-15 ENCOUNTER — Encounter (HOSPITAL_COMMUNITY): Payer: Self-pay

## 2015-10-26 ENCOUNTER — Other Ambulatory Visit: Payer: Self-pay | Admitting: Family Medicine

## 2015-11-10 ENCOUNTER — Other Ambulatory Visit: Payer: Self-pay | Admitting: Family Medicine

## 2015-12-09 ENCOUNTER — Other Ambulatory Visit: Payer: Self-pay | Admitting: Family Medicine

## 2015-12-12 ENCOUNTER — Telehealth: Payer: Self-pay

## 2015-12-12 NOTE — Telephone Encounter (Signed)
Pharm faxed notice that pt's ins does not cover Lantus unless she has tried/failed alternatives: Levemir, Tyler Aas, or Engineer, agricultural. Dr Tamala Julian, pt is coming for appt on 2/7, but will probably need something before then since it is a week away. Please advise if you want to change, or medical necessity reasoning for PA.

## 2015-12-13 NOTE — Telephone Encounter (Signed)
Pt called to check status of insulin because she will be out after tonight's dose. She reported that she has not tried any of the alternatives before, but pharmacist told her that Nancee Liter is basically the same as Lantus. I advised that our providers also often switch pts to Levemir. Pt agreed to try whatever Dr Tamala Julian thinks best. I will mark this high priority and suggested to pt that she check w/her pharmacy this evening to see if new Rx has been sent.

## 2015-12-14 MED ORDER — INSULIN DETEMIR 100 UNIT/ML FLEXPEN: 54.0000 [IU] | PEN_INJECTOR | Freq: Two times a day (BID) | SUBCUTANEOUS | Status: DC

## 2015-12-14 NOTE — Telephone Encounter (Signed)
Called pt and advised of new Rx on VM.

## 2015-12-14 NOTE — Telephone Encounter (Signed)
Call --- I sent in Levemir insulin to local pharmacy/[facility] in [place].

## 2015-12-15 ENCOUNTER — Encounter: Payer: Self-pay | Admitting: Family Medicine

## 2015-12-19 ENCOUNTER — Encounter: Payer: Self-pay | Admitting: Family Medicine

## 2015-12-19 ENCOUNTER — Ambulatory Visit (INDEPENDENT_AMBULATORY_CARE_PROVIDER_SITE_OTHER): Payer: BC Managed Care – PPO | Admitting: Family Medicine

## 2015-12-19 VITALS — BP 132/83 | HR 94 | Temp 98.0°F | Resp 16 | Ht 64.0 in | Wt 214.0 lb

## 2015-12-19 DIAGNOSIS — Z8041 Family history of malignant neoplasm of ovary: Secondary | ICD-10-CM

## 2015-12-19 DIAGNOSIS — E042 Nontoxic multinodular goiter: Secondary | ICD-10-CM | POA: Diagnosis not present

## 2015-12-19 DIAGNOSIS — Z0000 Encounter for general adult medical examination without abnormal findings: Secondary | ICD-10-CM

## 2015-12-19 DIAGNOSIS — E785 Hyperlipidemia, unspecified: Secondary | ICD-10-CM

## 2015-12-19 DIAGNOSIS — Z794 Long term (current) use of insulin: Secondary | ICD-10-CM

## 2015-12-19 DIAGNOSIS — Z803 Family history of malignant neoplasm of breast: Secondary | ICD-10-CM

## 2015-12-19 DIAGNOSIS — G4733 Obstructive sleep apnea (adult) (pediatric): Secondary | ICD-10-CM | POA: Diagnosis not present

## 2015-12-19 DIAGNOSIS — I10 Essential (primary) hypertension: Secondary | ICD-10-CM | POA: Diagnosis not present

## 2015-12-19 DIAGNOSIS — Z8371 Family history of colonic polyps: Secondary | ICD-10-CM

## 2015-12-19 DIAGNOSIS — E663 Overweight: Secondary | ICD-10-CM | POA: Diagnosis not present

## 2015-12-19 DIAGNOSIS — E041 Nontoxic single thyroid nodule: Secondary | ICD-10-CM

## 2015-12-19 DIAGNOSIS — E1142 Type 2 diabetes mellitus with diabetic polyneuropathy: Secondary | ICD-10-CM

## 2015-12-19 DIAGNOSIS — Z9989 Dependence on other enabling machines and devices: Secondary | ICD-10-CM

## 2015-12-19 LAB — CBC WITH DIFFERENTIAL/PLATELET
BASOS ABS: 0 10*3/uL (ref 0.0–0.1)
Basophils Relative: 0 % (ref 0–1)
Eosinophils Absolute: 0.2 10*3/uL (ref 0.0–0.7)
Eosinophils Relative: 2 % (ref 0–5)
HEMATOCRIT: 41.3 % (ref 36.0–46.0)
HEMOGLOBIN: 13.8 g/dL (ref 12.0–15.0)
LYMPHS ABS: 4.2 10*3/uL — AB (ref 0.7–4.0)
LYMPHS PCT: 35 % (ref 12–46)
MCH: 28.6 pg (ref 26.0–34.0)
MCHC: 33.4 g/dL (ref 30.0–36.0)
MCV: 85.5 fL (ref 78.0–100.0)
MONOS PCT: 6 % (ref 3–12)
MPV: 10.4 fL (ref 8.6–12.4)
Monocytes Absolute: 0.7 10*3/uL (ref 0.1–1.0)
NEUTROS PCT: 57 % (ref 43–77)
Neutro Abs: 6.8 10*3/uL (ref 1.7–7.7)
PLATELETS: 257 10*3/uL (ref 150–400)
RBC: 4.83 MIL/uL (ref 3.87–5.11)
RDW: 13.6 % (ref 11.5–15.5)
WBC: 11.9 10*3/uL — ABNORMAL HIGH (ref 4.0–10.5)

## 2015-12-19 LAB — COMPREHENSIVE METABOLIC PANEL
ALBUMIN: 4.2 g/dL (ref 3.6–5.1)
ALK PHOS: 58 U/L (ref 33–130)
ALT: 24 U/L (ref 6–29)
AST: 21 U/L (ref 10–35)
BUN: 17 mg/dL (ref 7–25)
CALCIUM: 9.5 mg/dL (ref 8.6–10.4)
CHLORIDE: 101 mmol/L (ref 98–110)
CO2: 27 mmol/L (ref 20–31)
Creat: 0.94 mg/dL (ref 0.50–1.05)
Glucose, Bld: 194 mg/dL — ABNORMAL HIGH (ref 65–99)
POTASSIUM: 4.4 mmol/L (ref 3.5–5.3)
Sodium: 139 mmol/L (ref 135–146)
TOTAL PROTEIN: 6.8 g/dL (ref 6.1–8.1)
Total Bilirubin: 0.5 mg/dL (ref 0.2–1.2)

## 2015-12-19 LAB — LIPID PANEL
Cholesterol: 122 mg/dL — ABNORMAL LOW (ref 125–200)
HDL: 30 mg/dL — ABNORMAL LOW (ref >=46)
LDL CALC: 64 mg/dL (ref <130)
TRIGLYCERIDES: 140 mg/dL (ref <150)
Total CHOL/HDL Ratio: 4.1 Ratio (ref <=5.0)
VLDL: 28 mg/dL (ref <30)

## 2015-12-19 LAB — POCT URINALYSIS DIP (MANUAL ENTRY)
BILIRUBIN UA: NEGATIVE
BILIRUBIN UA: NEGATIVE
Blood, UA: NEGATIVE
GLUCOSE UA: NEGATIVE
NITRITE UA: NEGATIVE
Protein Ur, POC: NEGATIVE
Spec Grav, UA: 1.02
Urobilinogen, UA: 0.2
pH, UA: 5

## 2015-12-19 LAB — MICROALBUMIN, URINE: Microalb, Ur: 1.9 mg/dL

## 2015-12-19 LAB — TSH: TSH: 1.5 mIU/L

## 2015-12-19 NOTE — Patient Instructions (Signed)

## 2015-12-19 NOTE — Progress Notes (Signed)
Subjective:    Patient ID: Madison Cowan, female    DOB: 10/06/1961, 55 y.o.   MRN: [id number]  12/19/2015  Annual Exam   HPI This 55 y.o. female presents for Complete Physical Examination.  Last physical:  01-18-2015 Pap smear:   01-18-2015 WNL HPV negative; GC/Chlam negative.  LMP age 53.   Mammogram: 10-2015 [facility] Colonoscopy:  Called to schedule for this summer; will begin scheduling in April 2017. TDAP:  2008 Pneumovax:  2016 Influenza:  08-2015 Eye exam:  11-25-2014; due for follow-up.   Dental exam:  Every six months.  Asthma: albuterol rarely.  Usually with acute illness.  Allergies; chronic PND; Zyrtec daily; hates nasal sprays.  DMII: switched to Levemir 54 units bid.  Out of Lantus for two days before starting Levemir.  This morning 162; yesterday 180-190.  Novolog 10-15 units qac.    Hyperlipidemia: switched to generic.   Thyroid nodule: overdue for repeat ultrasound and biopsy.  Referred by Darron Doom.   Review of Systems  Constitutional: Negative for fever, chills, diaphoresis, activity change, appetite change, fatigue and unexpected weight change.  HENT: Negative for congestion, dental problem, drooling, ear discharge, ear pain, facial swelling, hearing loss, mouth sores, nosebleeds, postnasal drip, rhinorrhea, sinus pressure, sneezing, sore throat, tinnitus, trouble swallowing and voice change.   Eyes: Negative for photophobia, pain, discharge, redness, itching and visual disturbance.  Respiratory: Negative for apnea, cough, choking, chest tightness, shortness of breath, wheezing and stridor.   Cardiovascular: Negative for chest pain, palpitations and leg swelling.  Gastrointestinal: Negative for nausea, vomiting, abdominal pain, diarrhea, constipation, blood in stool, abdominal distention, anal bleeding and rectal pain.  Endocrine: Negative for cold intolerance, heat intolerance, polydipsia, polyphagia and polyuria.  Genitourinary: Negative for dysuria, urgency,  frequency, hematuria, flank pain, decreased urine volume, vaginal bleeding, vaginal discharge, enuresis, difficulty urinating, genital sores, vaginal pain, menstrual problem, pelvic pain and dyspareunia.  Musculoskeletal: Negative for myalgias, back pain, joint swelling, arthralgias, gait problem, neck pain and neck stiffness.  Skin: Negative for color change, pallor, rash and wound.  Allergic/Immunologic: Negative for environmental allergies, food allergies and immunocompromised state.  Neurological: Negative for dizziness, tremors, seizures, syncope, facial asymmetry, speech difficulty, weakness, light-headedness, numbness and headaches.  Hematological: Negative for adenopathy. Does not bruise/bleed easily.  Psychiatric/Behavioral: Negative for suicidal ideas, hallucinations, behavioral problems, confusion, sleep disturbance, self-injury, dysphoric mood, decreased concentration and agitation. The patient is not nervous/anxious and is not hyperactive.     Past Medical History  Diagnosis Date  . Diabetes mellitus without complication ([place])   . Hypertension   . Hyperlipidemia   . OSA (obstructive sleep apnea) 11/11/2006    CPAP  . Heart murmur   . Glaucoma   . Allergy     Zyrtec daily.  . Arthritis     DDD lumbar spine  . Asthma     Albuterol seasonally.  . Thyroid disease     Thyroid nodules s/p needle biopsy negative.    . Neuromuscular disorder ([place])     R third nerve palsy   Past Surgical History  Procedure Laterality Date  . Cholecystectomy open  1979   No Known Allergies  Social History   Social History  . Marital Status: Divorced    Spouse Name: N/A  . Number of Children: N/A  . 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 Smoking status: Current Every Day Smoker -- 0.05 packs/day for 18 years  Types: Cigarettes  . Smokeless tobacco: Never Used  . Alcohol Use: 0.0 oz/week    0 Standard drinks or  equivalent per week     Comment: maybe 1-2 drinks per yr  . Drug Use: No  . Sexual Activity: No   Other Topics Concern  . Not on file   Social History Narrative   Marital status: Divorced after 25 years of marriage; not dating.      Children:  None       Lives: alone, dog.       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  [place] work.      Tobacco: 1/2 pdd x 20 years.      Alcohol:  None      Drugs: none       Education: [employer].       Exercise: No.      Seatbelt: 100%      Guns: unloaded.      Sexually active: none; total partners = 5.  Last STD screen 2006.     Family History  Problem Relation Age of Onset  . Diabetes Mother   . Hypertension Mother   . Ovarian cancer Mother 6  . Breast cancer Mother 66  . Cancer Mother 46    ovarian cancer age 72; breast cancer age 41  . Diabetes Father   . Heart disease Father 40    CAD/CABG; AAA; CHF  . Hyperlipidemia Father   . Hypertension Father   . Prostate cancer Father     dx. 66-83  . Colon polyps Father     section of colon removed; unsure of #  . Heart disease Brother     Died of CHF at age 69.  Marland Kitchen Hyperlipidemia Brother   . Hypertension Brother   . Congestive Heart Failure Brother   . Heart disease Sister 1    3-vessel CAD s/p 1 stenting  . Diabetes Sister   . Hyperlipidemia Sister   . Hypertension Sister   . Cancer Brother 68    GI- esophageal; smoker  . Heart disease Brother     MIs in 34s and CABG.  . Diabetes Brother   . Hypertension Brother   . Stroke Maternal Grandmother   . Skin cancer Maternal Grandfather     multiple - unknown types; dx. 2s  . Heart Problems Paternal Grandfather   . Skin cancer Maternal Aunt     multiple - unknown type; dx. 63s  . Pancreatic cancer Maternal Uncle     dx. 70s       Objective:    BP 132/83 mmHg  Pulse 94  Temp(Src) 98 F (36.7 C) (Oral)  Resp 16  Ht 5\' 4"  (1.626 m)  Wt 214 lb (97.07 kg)  BMI 36.72  kg/m2 Physical Exam  Constitutional: She is oriented to person, place, and time. She appears well-developed and well-nourished. No distress.  HENT:  Head: Normocephalic and atraumatic.  Right Ear: External ear normal.  Left Ear: External ear normal.  Nose: Nose normal.  Mouth/Throat: Oropharynx is clear and moist.  Eyes: Conjunctivae and EOM are normal. Pupils are equal, round, and reactive to light.  Neck: Normal range of motion and full passive range of motion without pain. Neck supple. No JVD present. Carotid bruit is not present. No thyromegaly present.  Cardiovascular: Normal rate, regular rhythm and normal heart sounds.  Exam reveals no gallop and no friction rub.   No murmur heard.  Pulmonary/Chest: Effort normal and breath sounds normal. She has no wheezes. She has no rales.  Abdominal: Soft. Bowel sounds are normal. She exhibits no distension and no mass. There is no tenderness. There is no rebound and no guarding.  Musculoskeletal:       Right shoulder: Normal.       Left shoulder: Normal.       Cervical back: Normal.  Lymphadenopathy:    She has no cervical adenopathy.  Neurological: She is alert and oriented to person, place, and time. She has normal reflexes. No cranial nerve deficit. She exhibits normal muscle tone. Coordination normal.  Skin: Skin is warm and dry. No rash noted. She is not diaphoretic. No erythema. No pallor.  Psychiatric: She has a normal mood and affect. Her behavior is normal. Judgment and thought content normal.  Nursing note and vitals reviewed.       Assessment & Plan:   1. Thyroid nodule   2. Routine physical examination   3. Controlled type 2 diabetes mellitus with diabetic polyneuropathy, with long-term current use of insulin ([place])   4. Family history of ovarian cancer   5. Family history of breast cancer in mother   80. Hyperlipidemia   7. Family history of colonic polyps   8. OSA on CPAP   9. Multinodular goiter   10. HYPERTENSION,  BENIGN   11. Overweight     Orders Placed This Encounter  Procedures  . US Soft Tissue Head/Neck    214 LBS/NO NEEDS/INS/BCBS/RLC/PT W/EPIC ORDER    Standing Status: Future     Number of Occurrences: 1     Standing Expiration Date: 02/15/2017    Order Specific Question:  Reason for Exam (SYMPTOM  OR DIAGNOSIS REQUIRED)    Answer:  follow up thyroid nodules from 2012    Order Specific Question:  Preferred imaging location?    Answer:  [facility]  . CBC with Differential/Platelet  . Comprehensive metabolic panel    Order Specific Question:  Has the patient fasted?    Answer:  Yes  . Hemoglobin A1c  . Lipid panel    Order Specific Question:  Has the patient fasted?    Answer:  Yes  . TSH  . Microalbumin, urine  . POCT urinalysis dipstick  . EKG 12-Lead   No orders of the defined types were placed in this encounter.    Return in about 4 months (around 04/17/2016) for recheck diabetes.    Kajuan Guyton Elayne Guerin, M.D. [facility] [address] [place], [place]  [address] [phone] phone [phone] fax

## 2015-12-20 LAB — HEMOGLOBIN A1C
HEMOGLOBIN A1C: 7.9 % — AB (ref <5.7)
MEAN PLASMA GLUCOSE: 180 mg/dL — AB (ref <117)

## 2016-01-03 ENCOUNTER — Ambulatory Visit
Admission: RE | Admit: 2016-01-03 | Discharge: 2016-01-03 | Disposition: A | Discharge status: Home / Self Care | Payer: BC Managed Care – PPO | Source: Ambulatory Visit | Attending: Family Medicine | Admitting: Family Medicine

## 2016-01-03 DIAGNOSIS — E041 Nontoxic single thyroid nodule: Secondary | ICD-10-CM

## 2016-02-11 ENCOUNTER — Other Ambulatory Visit: Payer: Self-pay | Admitting: Family Medicine

## 2016-03-28 ENCOUNTER — Other Ambulatory Visit: Payer: Self-pay | Admitting: Family Medicine

## 2016-05-07 ENCOUNTER — Other Ambulatory Visit: Payer: Self-pay | Admitting: Family Medicine

## 2016-05-07 ENCOUNTER — Ambulatory Visit (INDEPENDENT_AMBULATORY_CARE_PROVIDER_SITE_OTHER): Payer: BC Managed Care – PPO | Admitting: Family Medicine

## 2016-05-07 ENCOUNTER — Encounter: Payer: Self-pay | Admitting: Family Medicine

## 2016-05-07 VITALS — BP 122/70 | HR 95 | Temp 98.4°F | Resp 16 | Ht 62.5 in | Wt 211.2 lb

## 2016-05-07 DIAGNOSIS — G4733 Obstructive sleep apnea (adult) (pediatric): Secondary | ICD-10-CM

## 2016-05-07 DIAGNOSIS — R41840 Attention and concentration deficit: Secondary | ICD-10-CM

## 2016-05-07 DIAGNOSIS — Z794 Long term (current) use of insulin: Secondary | ICD-10-CM | POA: Diagnosis not present

## 2016-05-07 DIAGNOSIS — B370 Candidal stomatitis: Secondary | ICD-10-CM

## 2016-05-07 DIAGNOSIS — L918 Other hypertrophic disorders of the skin: Secondary | ICD-10-CM | POA: Diagnosis not present

## 2016-05-07 DIAGNOSIS — E1142 Type 2 diabetes mellitus with diabetic polyneuropathy: Secondary | ICD-10-CM

## 2016-05-07 DIAGNOSIS — Z9989 Dependence on other enabling machines and devices: Secondary | ICD-10-CM

## 2016-05-07 DIAGNOSIS — E042 Nontoxic multinodular goiter: Secondary | ICD-10-CM | POA: Diagnosis not present

## 2016-05-07 DIAGNOSIS — I10 Essential (primary) hypertension: Secondary | ICD-10-CM

## 2016-05-07 DIAGNOSIS — F329 Major depressive disorder, single episode, unspecified: Secondary | ICD-10-CM | POA: Diagnosis not present

## 2016-05-07 DIAGNOSIS — F32A Depression, unspecified: Secondary | ICD-10-CM

## 2016-05-07 DIAGNOSIS — E785 Hyperlipidemia, unspecified: Secondary | ICD-10-CM | POA: Diagnosis not present

## 2016-05-07 LAB — CBC WITH DIFFERENTIAL/PLATELET
BASOS ABS: 105 {cells}/uL (ref 0–200)
Basophils Relative: 1 %
EOS ABS: 315 {cells}/uL (ref 15–500)
EOS PCT: 3 %
HEMATOCRIT: 43.6 % (ref 35.0–45.0)
HEMOGLOBIN: 14.6 g/dL (ref 11.7–15.5)
LYMPHS ABS: 3990 {cells}/uL — AB (ref 850–3900)
Lymphocytes Relative: 38 %
MCH: 28.5 pg (ref 27.0–33.0)
MCHC: 33.5 g/dL (ref 32.0–36.0)
MCV: 85 fL (ref 80.0–100.0)
MONO ABS: 630 {cells}/uL (ref 200–950)
MPV: 10.9 fL (ref 7.5–12.5)
Monocytes Relative: 6 %
NEUTROS ABS: 5460 {cells}/uL (ref 1500–7800)
NEUTROS PCT: 52 %
Platelets: 240 10*3/uL (ref 140–400)
RBC: 5.13 MIL/uL — ABNORMAL HIGH (ref 3.80–5.10)
RDW: 13.5 % (ref 11.0–15.0)
WBC: 10.5 10*3/uL (ref 3.8–10.8)

## 2016-05-07 LAB — POCT GLYCOSYLATED HEMOGLOBIN (HGB A1C): HEMOGLOBIN A1C: 13.3

## 2016-05-07 LAB — GLUCOSE, POCT (MANUAL RESULT ENTRY): POC Glucose: 312 mg/dl — AB (ref 70–99)

## 2016-05-07 MED ORDER — INSULIN DETEMIR 100 UNIT/ML FLEXPEN: 60.0000 [IU] | PEN_INJECTOR | Freq: Two times a day (BID) | SUBCUTANEOUS | Status: DC

## 2016-05-07 NOTE — Patient Instructions (Signed)
     IF you received an x-ray today, you will receive an invoice from [facility]. Please contact [facility] at [phone] with questions or concerns regarding your invoice.   IF you received labwork today, you will receive an invoice from [facility]. Please contact [facility] at [phone] with questions or concerns regarding your invoice.   Our billing staff will not be able to assist you with questions regarding bills from these companies.  You will be contacted with the lab results as soon as they are available. The fastest way to get your results is to activate your My Chart account. Instructions are located on the last page of this paperwork. If you have not heard from us regarding the results in 2 weeks, please contact this office.      

## 2016-05-07 NOTE — Progress Notes (Signed)
Subjective:    Patient ID: Madison Cowan, female    DOB: January 17, 1961, 55 y.o.   MRN: [id number]  05/07/2016  Follow-up; Medication Refill; and BLEEDING GUM   HPI This 55 y.o. female presents for four month follow-up:   1.  Gums bleeding: spontaneous.  S/p recent dental evaluation.  Suggested spontaneous gum bleeding.  Also developed skin break out.  Also having urinary incontinence urge.  Sugars elevated.  Excessively thirsty; sugars have not been as controlled since switching to Levemir.  2. DMII: fastings 200 fastings and post-prandial.  Levemir 54 bid.    3.  Depression: intermittent issue for years; for two days, did nothing but sleep. If sits down, will sleep all day.  Gets nothing done and then gets depressed.  Years.    4.  OSA:  CPAP none; mask wakes pt up; got a new one; did fine for a while. Mask was waking up more than not.  Not adjusting well to CPAP; gets tired of getting frustrated with CPAP.  Sleeping really off; went to bed at 3:30am. Prefers to sleep 2-9.  Sleeping really late this week.  Sometimes productive at night. If turns on TV, it is over; will fall asleep walking television.  No sleep aides at nighttime.  Has tried Tylenol PM; arthralgias keep pt awake.  Works well.    5. Tobacco abuse; quit smoking 02/29/2016.  Had been cutting down over time; started with not smoking in car; never let self smoke in the house.  Sister had another AMI.  Motivated pt to quit smoking. Had sister stay with patient for recovery; could not smoke around sister.  6.  Memory issues: normal MRI 11/2014.  Rarely completes tasks; onset in childhood.    Review of Systems  Constitutional: Positive for fatigue. Negative for fever, chills and diaphoresis.  HENT: Negative for nosebleeds.   Eyes: Negative for visual disturbance.  Respiratory: Negative for cough and shortness of breath.   Cardiovascular: Negative for chest pain, palpitations and leg swelling.  Gastrointestinal: Negative for nausea,  vomiting, abdominal pain, diarrhea and constipation.  Endocrine: Negative for cold intolerance, heat intolerance, polydipsia, polyphagia and polyuria.  Neurological: Negative for dizziness, tremors, seizures, syncope, facial asymmetry, speech difficulty, weakness, light-headedness, numbness and headaches.  Hematological: Does not bruise/bleed easily.  Psychiatric/Behavioral: Positive for dysphoric mood and decreased concentration. Negative for suicidal ideas, sleep disturbance and self-injury. The patient is not nervous/anxious.     Past Medical History  Diagnosis Date  . Diabetes mellitus without complication ([place])   . Hypertension   . Hyperlipidemia   . OSA (obstructive sleep apnea) 11/11/2006    CPAP  . Heart murmur   . Glaucoma   . Allergy     Zyrtec daily.  . Arthritis     DDD lumbar spine  . Asthma     Albuterol seasonally.  . Thyroid disease     Thyroid nodules s/p needle biopsy negative.    . Neuromuscular disorder ([place])     R third nerve palsy   Past Surgical History  Procedure Laterality Date  . Cholecystectomy open  1979   No Known Allergies  Social History   Social History  . Marital Status: Divorced    Spouse Name: N/A  . Number of Children: N/A  . 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 Smoking status: Current Every Day Smoker -- 0.05 packs/day for 18 years  Types: Cigarettes  . Smokeless tobacco: Never Used  . Alcohol Use: 0.0 oz/week    0 Standard drinks or equivalent per week     Comment: maybe 1-2 drinks per yr  . Drug Use: No  . Sexual Activity: No   Other Topics Concern  . Not on file   Social History Narrative   Marital status: Divorced after 25 years of marriage; not dating.      Children:  None       Lives: alone, dog.       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  [place] work.      Tobacco: 1/2 pdd x 20 years.       Alcohol:  None      Drugs: none       Education: [employer].       Exercise: No.      Seatbelt: 100%      Guns: unloaded.      Sexually active: none; total partners = 5.  Last STD screen 2006.     Family History  Problem Relation Age of Onset  . Diabetes Mother   . Hypertension Mother   . Ovarian cancer Mother 75  . Breast cancer Mother 23  . Cancer Mother 37    ovarian cancer age 9; breast cancer age 29  . Diabetes Father   . Heart disease Father 26    CAD/CABG; AAA; CHF  . Hyperlipidemia Father   . Hypertension Father   . Prostate cancer Father     dx. 66-83  . Colon polyps Father     section of colon removed; unsure of #  . Heart disease Brother     Died of CHF at age 57.  Marland Kitchen Hyperlipidemia Brother   . Hypertension Brother   . Congestive Heart Failure Brother   . Heart disease Sister 61    3-vessel CAD s/p 1 stenting; CABG  . Diabetes Sister   . Hyperlipidemia Sister   . Hypertension Sister   . Cancer Brother 41    GI- esophageal; smoker  . Heart disease Brother     MIs in 64s and CABG.  . Diabetes Brother   . Hypertension Brother   . Stroke Maternal Grandmother   . Skin cancer Maternal Grandfather     multiple - unknown types; dx. 47s  . Heart Problems Paternal Grandfather   . Skin cancer Maternal Aunt     multiple - unknown type; dx. 41s  . Pancreatic cancer Maternal Uncle     dx. 70s       Objective:    BP 122/70 mmHg  Pulse 95  Temp(Src) 98.4 F (36.9 C) (Oral)  Resp 16  Ht 5' 2.5" (1.588 m)  Wt 211 lb 3.2 oz (95.8 kg)  BMI 37.99 kg/m2  SpO2 97% Physical Exam  Constitutional: She is oriented to person, place, and time. She appears well-developed and well-nourished. No distress.  HENT:  Head: Normocephalic and atraumatic.  Right Ear: External ear normal.  Left Ear: External ear normal.  Nose: Nose normal.  Mouth/Throat: Oropharynx is clear and moist.  White film scant along gumline upper. +skin tag periorbital region.  Eyes: Conjunctivae  and EOM are normal. Pupils are equal, round, and reactive to light.  Neck: Normal range of motion. Neck supple. Carotid bruit is not present. No thyromegaly present.  Cardiovascular: Normal rate, regular rhythm, normal heart sounds and intact distal pulses.  Exam reveals no gallop  and no friction rub.   No murmur heard. Pulmonary/Chest: Effort normal and breath sounds normal. She has no wheezes. She has no rales.  Abdominal: Soft. Bowel sounds are normal. She exhibits no distension and no mass. There is no tenderness. There is no rebound and no guarding.  Lymphadenopathy:    She has no cervical adenopathy.  Neurological: She is alert and oriented to person, place, and time. No cranial nerve deficit.  Skin: Skin is warm and dry. No rash noted. She is not diaphoretic. No erythema. No pallor.  Psychiatric: She has a normal mood and affect. Her behavior is normal.   Depression screen [facility] 2/9 05/07/2016 12/19/2015 09/08/2015 01/18/2015 10/05/2014  Decreased Interest 1 0 0 0 0  Down, Depressed, Hopeless 1 0 0 0 0  PHQ - 2 Score 2 0 0 0 0  Altered sleeping 2 - - - -  Tired, decreased energy 2 - - - -  Change in appetite 1 - - - -  Feeling bad or failure about yourself  1 - - - -  Trouble concentrating 3 - - - -  Moving slowly or fidgety/restless 2 - - - -  Suicidal thoughts 0 - - - -  PHQ-9 Score 13 - - - -        Assessment & Plan:   1. Controlled type 2 diabetes mellitus with diabetic polyneuropathy, with long-term current use of insulin ([place])   2. Hyperlipidemia   3. OSA on CPAP   4. Multinodular goiter   5. HYPERTENSION, BENIGN   6. Concentration deficit   7. Skin tag   8. Depression    -increase Levemir to 60mg  bid. -refer to psychology for ADD evaluation; depression may also be contributing to decrease in concentration. Chronic intermittent issue for patient. -non-compliance with CPAP contributing to hypersomnolence. -treat with Nystatin swish and swallow for thrush; gum bleeding  may be due to thrush.  No other sites of bleeding. Obtain CBC. -dermatology for facial skin tag removal.   Orders Placed This Encounter  Procedures  . CBC with Differential/Platelet  . Comprehensive metabolic panel    Order Specific Question:  Has the patient fasted?    Answer:  Yes  . Lipid panel    Order Specific Question:  Has the patient fasted?    Answer:  Yes  . Ambulatory referral to Psychology    Referral Priority:  Routine    Referral Type:  Psychiatric    Referral Reason:  Specialty Services Required    Requested Specialty:  Psychology    Number of Visits Requested:  1  . Ambulatory referral to Dermatology    Referral Priority:  Routine    Referral Type:  Consultation    Referral Reason:  Specialty Services Required    Requested Specialty:  Dermatology    Number of Visits Requested:  1  . POCT glucose (manual entry)  . POCT glycosylated hemoglobin (Hb A1C)  . POCT urinalysis dipstick   Meds ordered this encounter  Medications  . Insulin Detemir (LEVEMIR) 100 UNIT/ML Pen    Sig: Inject 60 Units into the skin 2 (two) times daily.    Dispense:  15 mL    Refill:  11    Disp: QS for three months  . nystatin (MYCOSTATIN) 100000 UNIT/ML suspension    Sig: Take 5 mLs (500,000 Units total) by mouth 4 (four) times daily.    Dispense:  200 mL    Refill:  0    Return in about  4 months (around 09/06/2016) for recheck diabetes, high blood pressure.   Ladajah Soltys Elayne Guerin, M.D. [facility] [address] [place], [place]  [address] [phone] phone [phone] fax

## 2016-05-08 LAB — COMPREHENSIVE METABOLIC PANEL
ALK PHOS: 58 U/L (ref 33–130)
ALT: 35 U/L — AB (ref 6–29)
AST: 26 U/L (ref 10–35)
Albumin: 4.3 g/dL (ref 3.6–5.1)
BILIRUBIN TOTAL: 0.8 mg/dL (ref 0.2–1.2)
BUN: 18 mg/dL (ref 7–25)
CALCIUM: 9.6 mg/dL (ref 8.6–10.4)
CO2: 25 mmol/L (ref 20–31)
Chloride: 98 mmol/L (ref 98–110)
Creat: 0.98 mg/dL (ref 0.50–1.05)
Glucose, Bld: 299 mg/dL — ABNORMAL HIGH (ref 65–99)
POTASSIUM: 4.1 mmol/L (ref 3.5–5.3)
SODIUM: 137 mmol/L (ref 135–146)
TOTAL PROTEIN: 6.8 g/dL (ref 6.1–8.1)

## 2016-05-08 LAB — LIPID PANEL
CHOL/HDL RATIO: 4.5 ratio (ref <=5.0)
CHOLESTEROL: 144 mg/dL (ref 125–200)
HDL: 32 mg/dL — AB (ref >=46)
LDL Cholesterol: 58 mg/dL (ref <130)
Triglycerides: 268 mg/dL — ABNORMAL HIGH (ref <150)
VLDL: 54 mg/dL — ABNORMAL HIGH (ref <30)

## 2016-05-10 MED ORDER — NYSTATIN 100000 UNIT/ML MT SUSP: 5.0000 mL | Freq: Four times a day (QID) | OROMUCOSAL | Status: DC

## 2016-06-11 ENCOUNTER — Telehealth: Payer: Self-pay | Admitting: Radiology

## 2016-06-11 NOTE — Telephone Encounter (Signed)
Pts pharmacy called in requesting to change prescription of Novolog from 3 bottles to 4. They state this will work better with pts insurance.

## 2016-06-13 MED ORDER — INSULIN ASPART 100 UNIT/ML ~~LOC~~ SOLN: 10.0000 [IU] | Freq: Three times a day (TID) | SUBCUTANEOUS | 5 refills | Status: DC

## 2016-06-13 NOTE — Telephone Encounter (Signed)
Sent rx for 4 vials of Novolog to pharmacy.

## 2016-06-19 ENCOUNTER — Other Ambulatory Visit: Payer: Self-pay | Admitting: Family Medicine

## 2016-06-24 ENCOUNTER — Encounter: Payer: Self-pay | Admitting: *Deleted

## 2016-07-18 ENCOUNTER — Other Ambulatory Visit: Payer: Self-pay | Admitting: Family Medicine

## 2016-07-19 ENCOUNTER — Telehealth: Payer: Self-pay | Admitting: *Deleted

## 2016-07-19 ENCOUNTER — Other Ambulatory Visit: Payer: Self-pay | Admitting: *Deleted

## 2016-07-19 NOTE — Telephone Encounter (Signed)
Please have patient increase Levemir to 65 units twice daily.  Have her do this for three days and then MyChart her blood sugar readings.

## 2016-07-19 NOTE — Telephone Encounter (Signed)
Talked with patient about lab results from 05/07/16.  She stated that after 1 week of taking Levimir her blood sugars was high.  She stated before the levimir her blood glucose in the morning was between 140-150 and now it is running between 200s to the low 300s.  She has not worked on her diet at all/ no exercise because of her schedule.  Do you need to maybe adjust her medication? She is due back in October for OV.  Please advise.  [phone]  1. Glucose/sugar is extremely elevated at 312. Your Hemoglobin A1c is also extremely elevated at 13.3.  2. One liver function test (ALT) is slightly elevated; I recommend repeating this level at your next visit. 3. Kidney function is normal. 4. Cholesterol is under good control except for elevated triglycerides; as your sugars improve, your triglycerides should also improve. I also recommend weight loss, exercise, and low-fat food choices to lower triglycerides. 5. No evidence of anemia. Platelet count is also normal. How are sugars running now that you increased Levemir to 60 units twice daily?

## 2016-07-20 NOTE — Telephone Encounter (Signed)
I have advised patient/ she has voiced understanding

## 2016-08-05 ENCOUNTER — Other Ambulatory Visit: Payer: Self-pay | Admitting: Family Medicine

## 2016-08-06 NOTE — Telephone Encounter (Signed)
Chart note states pt to return 09/06/2016. Refill listed as refused due to needing OV. Do you want to give 30 tabs to get her to the October window for an OV?  Please advise

## 2016-08-26 ENCOUNTER — Other Ambulatory Visit: Payer: Self-pay | Admitting: Family Medicine

## 2016-09-10 ENCOUNTER — Ambulatory Visit (INDEPENDENT_AMBULATORY_CARE_PROVIDER_SITE_OTHER): Payer: BC Managed Care – PPO | Admitting: Family Medicine

## 2016-09-10 ENCOUNTER — Encounter: Payer: Self-pay | Admitting: Family Medicine

## 2016-09-10 VITALS — BP 126/80 | HR 98 | Temp 98.3°F | Resp 18 | Ht 62.5 in | Wt 215.2 lb

## 2016-09-10 DIAGNOSIS — G4733 Obstructive sleep apnea (adult) (pediatric): Secondary | ICD-10-CM

## 2016-09-10 DIAGNOSIS — E042 Nontoxic multinodular goiter: Secondary | ICD-10-CM | POA: Diagnosis not present

## 2016-09-10 DIAGNOSIS — I10 Essential (primary) hypertension: Secondary | ICD-10-CM

## 2016-09-10 DIAGNOSIS — Z9989 Dependence on other enabling machines and devices: Secondary | ICD-10-CM | POA: Diagnosis not present

## 2016-09-10 DIAGNOSIS — E7800 Pure hypercholesterolemia, unspecified: Secondary | ICD-10-CM

## 2016-09-10 DIAGNOSIS — E1142 Type 2 diabetes mellitus with diabetic polyneuropathy: Secondary | ICD-10-CM | POA: Diagnosis not present

## 2016-09-10 DIAGNOSIS — Z23 Encounter for immunization: Secondary | ICD-10-CM

## 2016-09-10 DIAGNOSIS — Z794 Long term (current) use of insulin: Secondary | ICD-10-CM

## 2016-09-10 LAB — CBC WITH DIFFERENTIAL/PLATELET
BASOS ABS: 0 {cells}/uL (ref 0–200)
Basophils Relative: 0 %
EOS PCT: 2 %
Eosinophils Absolute: 188 cells/uL (ref 15–500)
HCT: 42.1 % (ref 35.0–45.0)
Hemoglobin: 14.2 g/dL (ref 11.7–15.5)
Lymphocytes Relative: 33 %
Lymphs Abs: 3102 cells/uL (ref 850–3900)
MCH: 28.5 pg (ref 27.0–33.0)
MCHC: 33.7 g/dL (ref 32.0–36.0)
MCV: 84.5 fL (ref 80.0–100.0)
MONOS PCT: 7 %
MPV: 10.1 fL (ref 7.5–12.5)
Monocytes Absolute: 658 cells/uL (ref 200–950)
NEUTROS ABS: 5452 {cells}/uL (ref 1500–7800)
NEUTROS PCT: 58 %
PLATELETS: 227 10*3/uL (ref 140–400)
RBC: 4.98 MIL/uL (ref 3.80–5.10)
RDW: 13.8 % (ref 11.0–15.0)
WBC: 9.4 10*3/uL (ref 3.8–10.8)

## 2016-09-10 LAB — LIPID PANEL
Cholesterol: 154 mg/dL (ref 125–200)
HDL: 33 mg/dL — ABNORMAL LOW (ref >=46)
LDL CALC: 86 mg/dL (ref <130)
Total CHOL/HDL Ratio: 4.7 Ratio (ref <=5.0)
Triglycerides: 177 mg/dL — ABNORMAL HIGH (ref <150)
VLDL: 35 mg/dL — AB (ref <30)

## 2016-09-10 LAB — COMPREHENSIVE METABOLIC PANEL
ALT: 35 U/L — ABNORMAL HIGH (ref 6–29)
AST: 31 U/L (ref 10–35)
Albumin: 4.4 g/dL (ref 3.6–5.1)
Alkaline Phosphatase: 52 U/L (ref 33–130)
BUN: 16 mg/dL (ref 7–25)
CHLORIDE: 99 mmol/L (ref 98–110)
CO2: 27 mmol/L (ref 20–31)
CREATININE: 1 mg/dL (ref 0.50–1.05)
Calcium: 9.7 mg/dL (ref 8.6–10.4)
Glucose, Bld: 284 mg/dL — ABNORMAL HIGH (ref 65–99)
Potassium: 4.2 mmol/L (ref 3.5–5.3)
Sodium: 137 mmol/L (ref 135–146)
Total Bilirubin: 0.6 mg/dL (ref 0.2–1.2)
Total Protein: 6.8 g/dL (ref 6.1–8.1)

## 2016-09-10 LAB — POCT GLYCOSYLATED HEMOGLOBIN (HGB A1C): Hemoglobin A1C: 11.3

## 2016-09-10 MED ORDER — "INSULIN SYRINGE-NEEDLE U-100 31G X 15/64"" 1 ML MISC": 2 refills | Status: DC

## 2016-09-10 MED ORDER — METFORMIN HCL 1000 MG PO TABS: ORAL_TABLET | ORAL | 3 refills | Status: DC

## 2016-09-10 MED ORDER — LOSARTAN POTASSIUM-HCTZ 100-12.5 MG PO TABS: 1.0000 | ORAL_TABLET | Freq: Every day | ORAL | 1 refills | Status: DC

## 2016-09-10 MED ORDER — GLUCOSE BLOOD VI STRP: ORAL_STRIP | 3 refills | Status: DC

## 2016-09-10 MED ORDER — INSULIN DETEMIR 100 UNIT/ML FLEXPEN: 70.0000 [IU] | PEN_INJECTOR | Freq: Two times a day (BID) | SUBCUTANEOUS | 11 refills | Status: DC

## 2016-09-10 MED ORDER — "INSULIN SYRINGE-NEEDLE U-100 31G X 5/16"" 0.5 ML MISC": 1.0000 | Freq: Three times a day (TID) | 11 refills | Status: DC

## 2016-09-10 MED ORDER — INSULIN PEN NEEDLE 32G X 4 MM MISC: 3 refills | Status: DC

## 2016-09-10 MED ORDER — ROSUVASTATIN CALCIUM 10 MG PO TABS: ORAL_TABLET | ORAL | 1 refills | Status: DC

## 2016-09-10 NOTE — Patient Instructions (Addendum)
1. Increase Levemir to 70 units twice daily.    IF you received an x-ray today, you will receive an invoice from [facility]. Please contact [facility] at [phone] with questions or concerns regarding your invoice.   IF you received labwork today, you will receive an invoice from [facility]. Please contact [facility] at [phone] with questions or concerns regarding your invoice.   Our billing staff will not be able to assist you with questions regarding bills from these companies.  You will be contacted with the lab results as soon as they are available. The fastest way to get your results is to activate your My Chart account. Instructions are located on the last page of this paperwork. If you have not heard from [place] regarding the results in 2 weeks, please contact this office.

## 2016-09-10 NOTE — Progress Notes (Signed)
Subjective:    Patient ID: Madison Cowan, female    DOB: Oct 04, 1961, 55 y.o.   MRN: [id number]  09/10/2016  Follow-up (4 month follow up)   HPI This 55 y.o. female presents for four month follow-up diabetes, hypercholesterolemia.  Extremely stressed out; feeling overwhelmed.  Missed nighttime Levemir last night; sugar 300 this morning.   Levemir 65units bid.  Less forgetful with evening Levemir.  Fasting sugars low 200s.  Has not contacted PCP to discuss high readings since last visit. Has been referred to nutritionist in past.  Interested in endocrinologist.  Caught self eating desserts; fast food is calling name at night.    Right now stress is horrible.  Rough star this year; staff are not following through; seven staff members; 120 children; having to [place].  Climate is different.  Novolog 15 units per meal.   BP Readings from Last 3 Encounters:  09/10/16 126/80  05/07/16 122/70  12/19/15 132/83   Wt Readings from Last 3 Encounters:  09/10/16 215 lb 3.2 oz (97.6 kg)  05/07/16 211 lb 3.2 oz (95.8 kg)  12/19/15 214 lb (97.1 kg)   Allergies have been horrible this season.  Has been itching like crazy.  Must take Xyzal daily due to itching.  Immunization History  Administered Date(s) Administered  . Hepatitis B, adult 01/18/2015, 02/23/2015, 09/08/2015  . Influenza Whole 11/25/2011  . Influenza,inj,Quad PF,36+ Mos 09/01/2013, 10/05/2014, 09/08/2015, 09/10/2016  . Pneumococcal Polysaccharide-23 11/11/2006, 09/08/2015  . Tdap 11/25/2006    Review of Systems  Constitutional: Negative for chills, diaphoresis, fatigue and fever.  HENT: Positive for congestion, postnasal drip and rhinorrhea. Negative for sinus pain, sinus pressure and sore throat.   Eyes: Negative for visual disturbance.  Respiratory: Negative for cough and shortness of breath.   Cardiovascular: Negative for chest pain, palpitations and leg swelling.  Gastrointestinal: Negative for abdominal pain,  constipation, diarrhea, nausea and vomiting.  Endocrine: Negative for cold intolerance, heat intolerance, polydipsia, polyphagia and polyuria.  Neurological: Positive for numbness. Negative for dizziness, tremors, seizures, syncope, facial asymmetry, speech difficulty, weakness, light-headedness and headaches.  Psychiatric/Behavioral: Negative for dysphoric mood. The patient is not nervous/anxious.     Past Medical History:  Diagnosis Date  . Allergy    Zyrtec daily.  . Arthritis    DDD lumbar spine  . Asthma    Albuterol seasonally.  . Diabetes mellitus without complication ([place])   . Glaucoma   . Heart murmur   . Hyperlipidemia   . Hypertension   . Neuromuscular disorder ([place])    R third nerve palsy  . OSA (obstructive sleep apnea) 11/11/2006   CPAP  . Thyroid disease    Thyroid nodules s/p needle biopsy negative.     Past Surgical History:  Procedure Laterality Date  . CHOLECYSTECTOMY OPEN  1979   No Known Allergies  Social History   Social History  . Marital status: Divorced    Spouse name: N/A  . Number of children: N/A  . 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 Smoking status: Current Every Day Smoker    Packs/day: 0.05    Years: 18.00    Types: Cigarettes  . Smokeless tobacco: Never Used  . Alcohol use 0.0 oz/week     Comment: maybe 1-2 drinks per yr  . Drug use: No  . Sexual activity: No   Other Topics Concern  . Not on file   Social History  Narrative   Marital status: Divorced after 22 years of marriage; not dating.      Children:  None       Lives: alone, dog.       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  [place] work.      Tobacco: 1/2 pdd x 20 years.      Alcohol:  None      Drugs: none       Education: [employer].       Exercise: No.      Seatbelt: 100%      Guns: unloaded.      Sexually active: none; total partners = 5.   Last STD screen 2006.     Family History  Problem Relation Age of Onset  . Diabetes Mother   . Hypertension Mother   . Ovarian cancer Mother 97  . Breast cancer Mother 48  . Cancer Mother 64    ovarian cancer age 45; breast cancer age 35  . Diabetes Father   . Heart disease Father 85    CAD/CABG; AAA; CHF  . Hyperlipidemia Father   . Hypertension Father   . Prostate cancer Father     dx. 56-83  . Colon polyps Father     section of colon removed; unsure of #  . Heart disease Brother     Died of CHF at age 72.  Marland Kitchen Hyperlipidemia Brother   . Hypertension Brother   . Congestive Heart Failure Brother   . Heart disease Sister 62    3-vessel CAD s/p 1 stenting; CABG  . Diabetes Sister   . Hyperlipidemia Sister   . Hypertension Sister   . Cancer Brother 11    GI- esophageal; smoker  . Heart disease Brother     MIs in 59s and CABG.  . Diabetes Brother   . Hypertension Brother   . Stroke Maternal Grandmother   . Skin cancer Maternal Grandfather     multiple - unknown types; dx. 60s  . Heart Problems Paternal Grandfather   . Skin cancer Maternal Aunt     multiple - unknown type; dx. 33s  . Pancreatic cancer Maternal Uncle     dx. 70s       Objective:    BP 126/80   Pulse 98   Temp 98.3 F (36.8 C) (Oral)   Resp 18   Ht 5' 2.5" (1.588 m)   Wt 215 lb 3.2 oz (97.6 kg)   SpO2 96%   BMI 38.73 kg/m  Physical Exam  Constitutional: She is oriented to person, place, and time. She appears well-developed and well-nourished. No distress.  HENT:  Head: Normocephalic and atraumatic.  Right Ear: External ear normal.  Left Ear: External ear normal.  Nose: Nose normal.  Mouth/Throat: Oropharynx is clear and moist.  Eyes: Conjunctivae and EOM are normal. Pupils are equal, round, and reactive to light.  Neck: Normal range of motion. Neck supple. Carotid bruit is not present. No thyromegaly present.  Cardiovascular: Normal rate, regular rhythm, normal heart sounds and intact  distal pulses.  Exam reveals no gallop and no friction rub.   No murmur heard. Pulmonary/Chest: Effort normal and breath sounds normal. She has no wheezes. She has no rales.  Abdominal: Soft. Bowel sounds are normal. She exhibits no distension and no mass. There is no tenderness. There is no rebound and no guarding.  Lymphadenopathy:    She has no cervical adenopathy.  Neurological: She  is alert and oriented to person, place, and time. No cranial nerve deficit.  Skin: Skin is warm and dry. No rash noted. She is not diaphoretic. No erythema. No pallor.  Psychiatric: She has a normal mood and affect. Her behavior is normal.   Depression screen [facility] 2/9 09/10/2016 05/07/2016 12/19/2015 09/08/2015 01/18/2015  Decreased Interest 0 1 0 0 0  Down, Depressed, Hopeless 0 1 0 0 0  PHQ - 2 Score 0 2 0 0 0  Altered sleeping - 2 - - -  Tired, decreased energy - 2 - - -  Change in appetite - 1 - - -  Feeling bad or failure about yourself  - 1 - - -  Trouble concentrating - 3 - - -  Moving slowly or fidgety/restless - 2 - - -  Suicidal thoughts - 0 - - -  PHQ-9 Score - 13 - - -   Fall Risk  09/10/2016 05/07/2016 12/19/2015 01/18/2015 10/05/2014  Falls in the past year? No Yes No No No  Number falls in past yr: - 1 - - -  Injury with Fall? - No - - -        Assessment & Plan:   1. Controlled type 2 diabetes mellitus with diabetic polyneuropathy, with long-term current use of insulin ([place])   2. HYPERTENSION, BENIGN   3. OSA on CPAP   4. Pure hypercholesterolemia   5. Multinodular goiter   6. Flu vaccine need    -increase Levemir to 70 units bid.  Refer to endocrinology for intense diabetic education and management.  -rx for Xyzal provided to replace current antihistamine. -obtain labs.   Orders Placed This Encounter  Procedures  . Flu Vaccine QUAD 36+ mos IM  . CBC with Differential/Platelet  . Comprehensive metabolic panel    Order Specific Question:   Has the patient fasted?    Answer:   Yes    . Lipid panel    Order Specific Question:   Has the patient fasted?    Answer:   Yes  . Ambulatory referral to Endocrinology    Referral Priority:   Routine    Referral Type:   Consultation    Referral Reason:   Specialty Services Required    Number of Visits Requested:   1  . POCT glycosylated hemoglobin (Hb A1C)   Meds ordered this encounter  Medications  . levocetirizine (XYZAL) 5 MG tablet    Sig: Take 5 mg by mouth every evening.  . Insulin Detemir (LEVEMIR) 100 UNIT/ML Pen    Sig: Inject 70 Units into the skin 2 (two) times daily.    Dispense:  15 mL    Refill:  11    Disp: QS for three months  . losartan-hydrochlorothiazide (HYZAAR) 100-12.5 MG tablet    Sig: Take 1 tablet by mouth daily.    Dispense:  90 tablet    Refill:  1  . rosuvastatin (CRESTOR) 10 MG tablet    Sig: TAKE 1 TABLET (10 MG TOTAL) BY MOUTH DAILY.    Dispense:  90 tablet    Refill:  1  . metFORMIN (GLUCOPHAGE) 1000 MG tablet    Sig: TAKE 1 TABLET (1,000 MG TOTAL) BY MOUTH 2 (TWO) TIMES DAILY.    Dispense:  180 tablet    Refill:  3  . Insulin Syringe-Needle U-100 (BD INSULIN SYRINGE ULTRAFINE) 31G X 5/16" 0.5 ML MISC    Sig: 1 Syringe by Does not apply route 3 (three) times daily.  Dispense:  100 each    Refill:  11  . glucose blood (ONE TOUCH ULTRA TEST) test strip    Sig: TESTING TWICE DAILY    Dispense:  100 each    Refill:  3    CYCLE FILL MEDICATION.  Marland Kitchen Insulin Pen Needle (BD PEN NEEDLE NANO U/F) 32G X 4 MM MISC    Sig: USE AS DIRECTED; inject insulin five times daily    Dispense:  100 each    Refill:  3  . Insulin Syringe-Needle U-100 (BD INSULIN SYRINGE ULTRAFINE) 31G X 15/64" 1 ML MISC    Sig: USE TO GIVE 10-15 UNITS 3 (THREE) TIMES DAILY BEFORE MEALS.    Dispense:  300 each    Refill:  2    Return in about 6 months (around 03/10/2017) for complete physical examiniation.   Rozelia Catapano Elayne Guerin, M.D. [facility] [address] [place], [place]   [address] [phone] phone [phone] fax

## 2016-11-01 ENCOUNTER — Ambulatory Visit (INDEPENDENT_AMBULATORY_CARE_PROVIDER_SITE_OTHER): Payer: BC Managed Care – PPO | Admitting: Internal Medicine

## 2016-11-01 ENCOUNTER — Encounter: Payer: Self-pay | Admitting: Internal Medicine

## 2016-11-01 VITALS — BP 152/88 | HR 88 | Wt 217.2 lb

## 2016-11-01 DIAGNOSIS — E1142 Type 2 diabetes mellitus with diabetic polyneuropathy: Secondary | ICD-10-CM | POA: Diagnosis not present

## 2016-11-01 DIAGNOSIS — E1165 Type 2 diabetes mellitus with hyperglycemia: Secondary | ICD-10-CM | POA: Diagnosis not present

## 2016-11-01 MED ORDER — INSULIN ASPART 100 UNIT/ML ~~LOC~~ SOLN: 20.0000 [IU] | Freq: Three times a day (TID) | SUBCUTANEOUS | 5 refills | Status: DC

## 2016-11-01 MED ORDER — DULAGLUTIDE 0.75 MG/0.5ML ~~LOC~~ SOAJ: SUBCUTANEOUS | 1 refills | Status: DC

## 2016-11-01 MED ORDER — INSULIN DETEMIR 100 UNIT/ML FLEXPEN: 60.0000 [IU] | PEN_INJECTOR | Freq: Two times a day (BID) | SUBCUTANEOUS | 5 refills | Status: DC

## 2016-11-01 NOTE — Patient Instructions (Addendum)
Please start Trulicity [id number] mcg weekly. Please let me know in 3 weeks if we can send the higher dose to your pharmacy.  Please decrease Levemir to 60 units 2x a day. Move the alarms earlier.  Increase Novolog to - 20 units for a smaller meal - 25 units for a larger meal  Continue Metformin 1000 mg 2x a day with meals.  Please schedule an appt with Antonieta Iba with nutrition.  Please return in 1.5 months with your sugar log.   PATIENT INSTRUCTIONS FOR TYPE 2 DIABETES:  **Please join MyChart!** - see attached instructions about how to join if you have not done so already.  DIET AND EXERCISE Diet and exercise is an important part of diabetic treatment.  We recommended aerobic exercise in the form of brisk walking (working between 40-60% of maximal aerobic capacity, similar to brisk walking) for 150 minutes per week (such as 30 minutes five days per week) along with 3 times per week performing 'resistance' training (using various gauge rubber tubes with handles) 5-10 exercises involving the major muscle groups (upper body, lower body and core) performing 10-15 repetitions (or near fatigue) each exercise. Start at half the above goal but build slowly to reach the above goals. If limited by weight, joint pain, or disability, we recommend daily walking in a swimming pool with water up to waist to reduce pressure from joints while allow for adequate exercise.    BLOOD GLUCOSES Monitoring your blood glucoses is important for continued management of your diabetes. Please check your blood glucoses 2-4 times a day: fasting, before meals and at bedtime (you can rotate these measurements - e.g. one day check before the 3 meals, the next day check before 2 of the meals and before bedtime, etc.).   HYPOGLYCEMIA (low blood sugar) Hypoglycemia is usually a reaction to not eating, exercising, or taking too much insulin/ other diabetes drugs.  Symptoms include tremors, sweating, hunger, confusion, headache,  etc. Treat IMMEDIATELY with 15 grams of Carbs: . 4 glucose tablets .  cup regular juice/soda . 2 tablespoons raisins . 4 teaspoons sugar . 1 tablespoon honey Recheck blood glucose in 15 mins and repeat above if still symptomatic/blood glucose <100.  RECOMMENDATIONS TO REDUCE YOUR RISK OF DIABETIC COMPLICATIONS: * Take your prescribed MEDICATION(S) * Follow a DIABETIC diet: Complex carbs, fiber rich foods, (monounsaturated and polyunsaturated) fats * AVOID saturated/trans fats, high fat foods, >2,300 mg salt per day. * EXERCISE at least 5 times a week for 30 minutes or preferably daily.  * DO NOT SMOKE OR DRINK more than 1 drink a day. * Check your FEET every day. Do not wear tightfitting shoes. Contact us if you develop an ulcer * See your EYE doctor once a year or more if needed * Get a FLU shot once a year * Get a PNEUMONIA vaccine once before and once after age 73 years  GOALS:  * Your Hemoglobin A1c of <7%  * fasting sugars need to be <130 * after meals sugars need to be <180 (2h after you start eating) * Your Systolic BP should be [id number] or lower  * Your Diastolic BP should be 80 or lower  * Your HDL (Good Cholesterol) should be 40 or higher  * Your LDL (Bad Cholesterol) should be 100 or lower. * Your Triglycerides should be 150 or lower  * Your Urine microalbumin (kidney function) should be <30 * Your Body Mass Index should be 25 or lower    Please consider the  following ways to cut down carbs and fat and increase fiber and micronutrients in your diet: - substitute whole grain for white bread or pasta - substitute brown rice for white rice - substitute 90-calorie flat bread pieces for slices of bread when possible - substitute sweet potatoes or yams for white potatoes - substitute humus for margarine - substitute tofu for cheese when possible - substitute almond or rice milk for regular milk (would not drink soy milk daily due to concern for soy estrogen influence on  breast cancer risk) - substitute dark chocolate for other sweets when possible - substitute water - can add lemon or orange slices for taste - for diet sodas (artificial sweeteners will trick your body that you can eat sweets without getting calories and will lead you to overeating and weight gain in the long run) - do not skip breakfast or other meals (this will slow down the metabolism and will result in more weight gain over time)  - can try smoothies made from fruit and almond/rice milk in am instead of regular breakfast - can also try old-fashioned (not instant) oatmeal made with almond/rice milk in am - order the dressing on the side when eating salad at a restaurant (pour less than half of the dressing on the salad) - eat as little meat as possible - can try juicing, but should not forget that juicing will get rid of the fiber, so would alternate with eating raw veg./fruits or drinking smoothies - use as little oil as possible, even when using olive oil - can dress a salad with a mix of balsamic vinegar and lemon juice, for e.g. - use agave nectar, stevia sugar, or regular sugar rather than artificial sweateners - steam or broil/roast veggies  - snack on veggies/fruit/nuts (unsalted, preferably) when possible, rather than processed foods - reduce or eliminate aspartame in diet (it is in diet sodas, chewing gum, etc) Read the labels!  Try to read Dr. Janene Harvey book: "Program for Reversing Diabetes" for other ideas for healthy eating.

## 2016-11-01 NOTE — Progress Notes (Signed)
Patient ID: Madison Cowan, female   DOB: [birth year], 55 y.o.   MRN: [id number]   HPI: Madison Cowan is a 55 y.o.-year-old female, referred by her PCP, Dr. 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  Last hemoglobin A1c was: Lab Results  Component Value Date   HGBA1C 11.3 09/10/2016   HGBA1C 13.3 05/07/2016   HGBA1C 7.9 (H) 12/19/2015   Pt is on a regimen of: - Metformin 1000 mg 2x a day, with meals - Levemir 70 units 2x a day - missed the evening Levemir frequently - Novolog 10-15 units 3x a day, before meals - missed this less often  Pt checks her sugars 1x a day and they are: - am: 279, 300-376 - 2h after b'fast: n/c - before lunch: 300s - 2h after lunch: n/c - before dinner: 300s - 2h after dinner: 326-371 - bedtime: n/c - nighttime: n/c No lows. Lowest sugar was 160s (11/2015); she has hypoglycemia awareness at 70.  Highest sugar was 390s.  Glucometer: One Touch ultra  Pt's meals are: - Breakfast: protein shakes (6g carbs); McDonads  - Lunch: may skip; salads cheeseburger; school cafeteria food - Dinner: fast food; porc chops + collard greens + salad; hamburger; McNuggets - Snacks: sometimes; sweets, nuts, chips  - no CKD, last BUN/creatinine:  Lab Results  Component Value Date   BUN 16 09/10/2016   BUN 18 05/07/2016   CREATININE 1.00 09/10/2016   CREATININE 0.98 05/07/2016   - last set of lipids: Lab Results  Component Value Date   CHOL 154 09/10/2016   HDL 33 (L) 09/10/2016   LDLCALC 86 09/10/2016   TRIG 177 (H) 09/10/2016   CHOLHDL 4.7 09/10/2016  On Crestor. - last eye exam was in 2015. No DR. + glaucoma suspect. - + numbness and tingling in her feet. + burning.  Pt has FH of DM in M,F,B,S.  She also has a history of HTN, HL.  She also has thyroid nodules - s/p benign biopsy. Reviewed most recent thyroid ultrasound from 01/03/2016: Dominant nodule is  a right mid lobe hypoechoic nodule of 2.1 x 0.8 x 1.2 cm, and previously measured up to 2.3 cm. There is one left thyroid nodule measuring 6 mm and several other tiny scattered nodules.  ROS: Constitutional: no weight gain/loss,+ fatigue, no subjective hyperthermia/hypothermia Eyes: no blurry vision, no xerophthalmia ENT: no sore throat, no nodules palpated in throat, no dysphagia/odynophagia, no hoarseness Cardiovascular: no CP/SOB/palpitations/leg swelling Respiratory: no cough/SOB Gastrointestinal: no N/V/D/C Musculoskeletal: no muscle/joint aches Skin: no rashes Neurological: no tremors/+ numbness - feet/no tingling/dizziness Psychiatric: no depression/anxiety  Past Medical History:  Diagnosis Date  . Allergy    Zyrtec daily.  . Arthritis    DDD lumbar spine  . Asthma    Albuterol seasonally.  . Diabetes mellitus without complication ([place])   . Glaucoma   . Heart murmur   . Hyperlipidemia   . Hypertension   . Neuromuscular disorder ([place])    R third nerve palsy  . OSA (obstructive sleep apnea) 11/11/2006   CPAP  . Thyroid disease    Thyroid nodules s/p needle biopsy negative.    Also, spondylolisthesis L4-L5  Past Surgical History:  Procedure Laterality Date  . CHOLECYSTECTOMY OPEN  1979   Social History   Social History  . Marital status: Divorced    Spouse name: N/A  . 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 Smoking status: Former Smoker    Packs/day: 0.05    Years: 18.00    Types: Cigarettes    Quit date: 02/2016  . Smokeless tobacco: Never Used  . Alcohol use 0.0 oz/week     Comment: maybe 1-2 drinks per yr  . Drug use: No  . Sexual activity: No   Other Topics Concern  . 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      Children:  None       Lives: alone, dog.       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  [place] work.      Tobacco: 1/2 pdd x 20 years.      Alcohol:  None      Drugs: none       Education: [employer].       Exercise: No.      Seatbelt: 100%      Guns: unloaded.      Sexually active: none; total partners = 5.  Last STD screen 2006.     Current Outpatient Prescriptions on File Prior to Visit  Medication Sig Dispense Refill  . metFORMIN (GLUCOPHAGE) 1000 MG tablet TAKE 1 TABLET (1,000 MG TOTAL) BY MOUTH 2 (TWO) TIMES DAILY. 180 tablet 3  . albuterol (PROVENTIL HFA;VENTOLIN HFA) 108 (90 BASE) MCG/ACT inhaler Inhale 2 puffs into the lungs every 6 (six) hours as needed for wheezing. 1 Inhaler 3  . Ascorbic Acid (VITAMIN C) 100 MG tablet Take 100 mg by mouth daily.    Marland Kitchen aspirin 325 MG EC tablet Take 325 mg by mouth daily.    Marland Kitchen b complex vitamins tablet Take 1 tablet by mouth daily.    . cetirizine (ZYRTEC) 10 MG tablet Take 10 mg by mouth daily.    Marland Kitchen glucose blood (ONE TOUCH ULTRA TEST) test strip TESTING TWICE DAILY 100 each 3  . Insulin Pen Needle (BD PEN NEEDLE NANO U/F) 32G X 4 MM MISC USE AS DIRECTED; inject insulin five times daily 100 each 3  . Insulin Syringe-Needle U-100 (BD INSULIN SYRINGE ULTRAFINE) 31G X 15/64" 1 ML MISC USE TO GIVE 10-15 UNITS 3 (THREE) TIMES DAILY BEFORE MEALS. 300 each 2  . Insulin Syringe-Needle U-100 (BD INSULIN SYRINGE ULTRAFINE) 31G X 5/16" 0.5 ML MISC 1 Syringe by Does not apply route 3 (three) times daily. 100 each 11  . levocetirizine (XYZAL) 5 MG tablet Take 5 mg by mouth every evening.    Marland Kitchen losartan-hydrochlorothiazide (HYZAAR) 100-12.5 MG tablet Take 1 tablet by mouth daily. 90 tablet 1  . Multiple Vitamins-Minerals (MULTIVITAMIN WITH MINERALS) tablet Take 1 tablet by mouth daily.    Marland Kitchen nystatin (MYCOSTATIN) 100000 UNIT/ML suspension Take 5 mLs (500,000 Units total) by mouth 4 (four) times daily. (Patient not taking: Reported on 09/10/2016) 200 mL 0  . rosuvastatin (CRESTOR) 10 MG tablet Take  1 tablet (10 mg total) by mouth daily. 30 tablet 5  . rosuvastatin (CRESTOR) 10 MG tablet TAKE 1 TABLET (10 MG TOTAL) BY MOUTH DAILY. 90 tablet 1   No current facility-administered medications on file prior to visit.    No Known Allergies Family History  Problem Relation Age of Onset  . Diabetes Mother   . Hypertension Mother   . Ovarian cancer Mother 31  . Breast cancer Mother 22  .  Cancer Mother 65    ovarian cancer age 72; breast cancer age 9  . Diabetes Father   . Heart disease Father 28    CAD/CABG; AAA; CHF  . Hyperlipidemia Father   . Hypertension Father   . Prostate cancer Father     dx. 45-83  . Colon polyps Father     section of colon removed; unsure of #  . Heart disease Brother     Died of CHF at age 1.  Marland Kitchen Hyperlipidemia Brother   . Hypertension Brother   . Congestive Heart Failure Brother   . Heart disease Sister 69    3-vessel CAD s/p 1 stenting; CABG  . Diabetes Sister   . Hyperlipidemia Sister   . Hypertension Sister   . Cancer Brother 20    GI- esophageal; smoker  . Heart disease Brother     MIs in 86s and CABG.  . Diabetes Brother   . Hypertension Brother   . Stroke Maternal Grandmother   . Skin cancer Maternal Grandfather     multiple - unknown types; dx. 59s  . Heart Problems Paternal Grandfather   . Skin cancer Maternal Aunt     multiple - unknown type; dx. 71s  . Pancreatic cancer Maternal Uncle     dx. 35s   PE: BP (!) 152/88 (BP Location: Left Arm, Patient Position: Sitting, Cuff Size: Normal)   Pulse 88   Wt 217 lb 3.2 oz (98.5 kg)   SpO2 98%   BMI 39.09 kg/m  Wt Readings from Last 3 Encounters:  11/01/16 217 lb 3.2 oz (98.5 kg)  09/10/16 215 lb 3.2 oz (97.6 kg)  05/07/16 211 lb 3.2 oz (95.8 kg)   Constitutional: overweight, in NAD Eyes: PERRLA, EOMI, no exophthalmos ENT: moist mucous membranes, no thyromegaly, no cervical lymphadenopathy Cardiovascular: RRR, No MRG Respiratory: CTA B Gastrointestinal: abdomen soft, NT, ND,  BS+ Musculoskeletal: no deformities, strength intact in all 4 Skin: moist, warm, no rashes Neurological: no tremor with outstretched hands, DTR normal in all 4  ASSESSMENT: 1. DM2, insulin-dependent, uncontrolled, with complications - Peripheral neuropathy.  PLAN:  1. Patient with long-standing, uncontrolled diabetes, on basal-bolus insulin and oral diabetes regimen, which became insufficient.She is taking a very large dose of long-acting insulin (140 units of Levemir) and the much smaller dose of rapid acting insulin (up to 45 units a day). Her sugars stay in the 200-300 range. We discussed that a balanced, physiologic, insulin regimen will contain approximately equal amounts of long and rapid acting insulin per day. Therefore, we would reduce her Levemir, while increasing the NovoLog. We also discussed about adding a GLP-1 receptor agonist to help reduce her insulin requirements. She agrees to start Trulicity low dose and she will let me know in 3 weeks if she is tolerating this dose so we can increase to the higher dose, 1.5 mg weekly. - We also discussed about the absolute need to improve her diet and she accepts a referral to nutrition. She also needs to start exercising. She is determined to start both, and get her diabetes under control. - I suggested to:  Patient Instructions  Please start Trulicity [id number] mcg weekly. Please let me know in 3 weeks if we can send the higher dose to your pharmacy.  Please decrease Levemir to 60 units 2x a day. Move the alarms earlier.  Increase Novolog to - 20 units for a smaller meal - 25 units for a larger meal  Continue Metformin 1000 mg 2x a  day with meals.  Please schedule an appt with Antonieta Iba with nutrition.  Please return in 1.5 months with your sugar log.   - Strongly advised her to start checking sugars at different times of the day - check 3 times a day, rotating checks - given sugar log and advised how to fill it and to bring it at next  appt  - given foot care handout and explained the principles  - given instructions for hypoglycemia management "15-15 rule"  - advised for yearly eye exams >> she needs a new one - Return to clinic in 1.5 mo with sugar log   Philemon Kingdom, MD PhD [facility] Endocrinology

## 2016-11-01 NOTE — Progress Notes (Signed)
Pre visit review using our clinic review tool, if applicable. No additional management support is needed unless otherwise documented below in the visit note. 

## 2016-11-14 ENCOUNTER — Encounter: Payer: Self-pay | Admitting: Internal Medicine

## 2016-11-19 ENCOUNTER — Other Ambulatory Visit: Payer: Self-pay | Admitting: Internal Medicine

## 2016-11-19 MED ORDER — INSULIN ASPART 100 UNIT/ML ~~LOC~~ SOLN: 20.0000 [IU] | Freq: Three times a day (TID) | SUBCUTANEOUS | 5 refills | Status: DC

## 2016-11-28 ENCOUNTER — Encounter: Payer: BC Managed Care – PPO | Admitting: Dietician

## 2016-12-03 ENCOUNTER — Encounter: Payer: Self-pay | Admitting: Internal Medicine

## 2016-12-04 ENCOUNTER — Other Ambulatory Visit: Payer: Self-pay | Admitting: Internal Medicine

## 2016-12-04 MED ORDER — DULAGLUTIDE 1.5 MG/0.5ML ~~LOC~~ SOAJ: SUBCUTANEOUS | 5 refills | Status: DC

## 2016-12-06 ENCOUNTER — Encounter: Payer: Self-pay | Admitting: Dietician

## 2016-12-06 ENCOUNTER — Encounter: Payer: BC Managed Care – PPO | Attending: Internal Medicine | Admitting: Dietician

## 2016-12-06 DIAGNOSIS — E1165 Type 2 diabetes mellitus with hyperglycemia: Secondary | ICD-10-CM | POA: Insufficient documentation

## 2016-12-06 DIAGNOSIS — E1142 Type 2 diabetes mellitus with diabetic polyneuropathy: Secondary | ICD-10-CM | POA: Diagnosis present

## 2016-12-06 DIAGNOSIS — Z713 Dietary counseling and surveillance: Secondary | ICD-10-CM | POA: Diagnosis not present

## 2016-12-06 NOTE — Progress Notes (Signed)
Diabetes Self-Management Education  Visit Type: First/Initial  Appt. Start Time: 1015 Appt. End Time: [id number]  12/06/2016  [name], identified by name and date of birth, is a 56 y.o. female with a diagnosis of Diabetes: Type 2. Other hx includes HTN, hyperlipidemia, OSA and does not use C-pap because it is uncomfortable and DM related vision problems.  Her last A1C was 11.3% 09/10/16.  Weight is stable at 216 lbs.    Patient lives alone.  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  She works long hours which interferes with her ability to exercise and eat more healthfully.    ASSESSMENT  Height 5\' 3"  (1.6 m), weight 216 lb (98 kg). Body mass index is 38.26 kg/m.      Diabetes Self-Management Education - 12/06/16 1025      Visit Information   Visit Type First/Initial     Initial Visit   Diabetes Type Type 2   Are you currently following a meal plan? No  tries to keep carbs 30-45 per meal and 15-20 g per snack   Are you taking your medications as prescribed? Yes  occasionally forgets the insulin.  Forgot the Levemir last night.   Date Diagnosed 1998     Health Coping   How would you rate your overall health? Fair     Psychosocial Assessment   Patient Belief/Attitude about Diabetes Motivated to manage diabetes   Self-care barriers None   Self-management support Doctor's office   Other persons present Patient   Patient Concerns Nutrition/Meal planning;Glycemic Control   Special Needs None   Preferred Learning Style No preference indicated   Learning Readiness Ready   How often do you need to have someone help you when you read instructions, pamphlets, or other written materials from your doctor or pharmacy? 1 - Never   What is the last grade level you completed in school? 1 year college     Pre-Education Assessment   Patient understands the diabetes disease and treatment process. Needs Review   Patient understands incorporating  nutritional management into lifestyle. Needs Review   Patient undertands incorporating physical activity into lifestyle. Needs Review   Patient understands using medications safely. Demonstrates understanding / competency   Patient understands monitoring blood glucose, interpreting and using results Needs Review   Patient understands prevention, detection, and treatment of acute complications. Needs Review   Patient understands prevention, detection, and treatment of chronic complications. Needs Review   Patient understands how to develop strategies to address psychosocial issues. Needs Review   Patient understands how to develop strategies to promote health/change behavior. Needs Review     Complications   Last HgB A1C per patient/outside source 11.3 %  09/10/16 decreased from >13   How often do you check your blood sugar? 1-2 times/day  1-3 times daily   Fasting Blood glucose range (mg/dL) >200;180-200   Postprandial Blood glucose range (mg/dL) >200   Number of hypoglycemic episodes per month 0   Number of hyperglycemic episodes per week 21   Can you tell when your blood sugar is high? Yes   What do you do if your blood sugar is high? drinks water   Have you had a dilated eye exam in the past 12 months? Yes   Have you had a dental exam in the past 12 months? Yes   Are you checking your feet? Yes   How many days per week are you checking your feet? 5  Dietary Intake   Breakfast often skips but tries to keep boiled eggs or protein shake   Snack (morning) nuts   Lunch sandwich OR salad OR leftovers OR fast food OR [employer] vegetable lasagne AND occasional fruit   Snack (afternoon) occasional cheese stick OR nuts   Dinner beef and veges with rice from [place] chicken nuggets and fries   Snack (evening) nuts   Beverage(s) water, unsweetened tea, diet lemonade     Exercise   Exercise Type ADL's   How many days per week to you exercise? 0   How many minutes per day  do you exercise? 0   Total minutes per week of exercise 0     Patient Education   Previous Diabetes Education Yes (please comment)  1998   Disease state  Other (comment)  review of type 2 diabetes and insulin resistance and trend of DM over tiem   Nutrition management  Role of diet in the treatment of diabetes and the relationship between the three main macronutrients and blood glucose level;Food label reading, portion sizes and measuring food.;Meal options for control of blood glucose level and chronic complications.;Information on hints to eating out and maintain blood glucose control.   Physical activity and exercise  Role of exercise on diabetes management, blood pressure control and cardiac health.;Helped patient identify appropriate exercises in relation to his/her diabetes, diabetes complications and other health issue.   Medications Reviewed patients medication for diabetes, action, purpose, timing of dose and side effects.   Monitoring Identified appropriate SMBG and/or A1C goals.;Purpose and frequency of SMBG.   Acute complications Other (comment)  treatment of low feeling above 80   Chronic complications Relationship between chronic complications and blood glucose control;Identified and discussed with patient  current chronic complications   Psychosocial adjustment Worked with patient to identify barriers to care and solutions;Role of stress on diabetes;Identified and addressed patients feelings and concerns about diabetes;Brainstormed with patient on coping mechanisms for social situations, getting support from significant others, dealing with feelings about diabetes     Individualized Goals (developed by patient)   Nutrition General guidelines for healthy choices and portions discussed   Physical Activity Exercise 5-7 days per week;30 minutes per day   Medications take my medication as prescribed   Monitoring  test my blood glucose as discussed   Reducing Risk examine blood glucose  patterns;Other (comment)  healthy meal and medication schedule     Post-Education Assessment   Patient understands the diabetes disease and treatment process. Demonstrates understanding / competency   Patient understands incorporating nutritional management into lifestyle. Demonstrates understanding / competency   Patient undertands incorporating physical activity into lifestyle. Demonstrates understanding / competency   Patient understands using medications safely. Demonstrates understanding / competency   Patient understands monitoring blood glucose, interpreting and using results Demonstrates understanding / competency   Patient understands prevention, detection, and treatment of acute complications. Demonstrates understanding / competency   Patient understands prevention, detection, and treatment of chronic complications. Demonstrates understanding / competency   Patient understands how to develop strategies to address psychosocial issues. Demonstrates understanding / competency   Patient understands how to develop strategies to promote health/change behavior. Demonstrates understanding / competency     Outcomes   Expected Outcomes Demonstrated interest in learning. Expect positive outcomes   Future DMSE PRN   Program Status Completed      Individualized Plan for Diabetes Self-Management Training:   Learning Objective:  Patient will have a greater understanding of diabetes self-management.  Patient education plan is to attend individual and/or group sessions per assessed needs and concerns.   Plan:   Patient Instructions  Take time to take care of you. Breakfast, lunch, dinner daily. Small snack if hungry. Choose baked or broiled or grilled rather than fried. Remember to take your medication as prescribed. Consider a Yoga class.  What else can you do for exercise.  Aim for 30 minutes most days.  Aim for 2-3 Carb Choices per meal (30-45 grams) +/- 1 either way  Aim for 0-1  Carbs per snack if hungry  Include protein in moderation with your meals and snacks Consider reading food labels for Total Carbohydrate and Fat Grams of foods Consider checking BG at alternate times per day as directed by MD          Expected Outcomes:  Demonstrated interest in learning. Expect positive outcomes  Education material provided: Living Well with Diabetes, Food label handouts, A1C conversion sheet, Meal plan card, My Plate, Snack sheet and Support group flyer  If problems or questions, patient to contact team via:  Phone and Email  Future DSME appointment: PRN

## 2016-12-06 NOTE — Patient Instructions (Signed)
Take time to take care of you. Breakfast, lunch, dinner daily. Small snack if hungry. Choose baked or broiled or grilled rather than fried. Remember to take your medication as prescribed. Consider a Yoga class.  What else can you do for exercise.  Aim for 30 minutes most days.  Aim for 2-3 Carb Choices per meal (30-45 grams) +/- 1 either way  Aim for 0-1 Carbs per snack if hungry  Include protein in moderation with your meals and snacks Consider reading food labels for Total Carbohydrate and Fat Grams of foods Consider checking BG at alternate times per day as directed by MD

## 2016-12-12 ENCOUNTER — Encounter: Payer: Self-pay | Admitting: Internal Medicine

## 2016-12-12 ENCOUNTER — Ambulatory Visit (INDEPENDENT_AMBULATORY_CARE_PROVIDER_SITE_OTHER): Payer: BC Managed Care – PPO | Admitting: Internal Medicine

## 2016-12-12 VITALS — BP 124/72 | HR 91 | Ht 63.0 in | Wt 215.0 lb

## 2016-12-12 DIAGNOSIS — Z794 Long term (current) use of insulin: Secondary | ICD-10-CM

## 2016-12-12 DIAGNOSIS — E1142 Type 2 diabetes mellitus with diabetic polyneuropathy: Secondary | ICD-10-CM

## 2016-12-12 LAB — POCT GLYCOSYLATED HEMOGLOBIN (HGB A1C): HEMOGLOBIN A1C: 9.3

## 2016-12-12 MED ORDER — INSULIN DETEMIR 100 UNIT/ML FLEXPEN: 60.0000 [IU] | PEN_INJECTOR | Freq: Two times a day (BID) | SUBCUTANEOUS | 5 refills | Status: DC

## 2016-12-12 NOTE — Progress Notes (Signed)
Patient ID: Madison Cowan, female   DOB: 1961/04/10, 56 y.o.   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 Last visit 1.5 mo ago.  She saw nutrition since last visit >> she is more mindful with her diet.  Last hemoglobin A1c was: Lab Results  Component Value Date   HGBA1C 11.3 09/10/2016   HGBA1C 13.3 05/07/2016   HGBA1C 7.9 (H) 12/19/2015   Pt was on a regimen of: - Metformin 1000 mg 2x a day, with meals - Levemir 70 units 2x a day - missed the evening Levemir frequently - Novolog 10-15 units 3x a day, before meals - missed this less often  At last visit we changed to: - Trulicity 1.5 mcg weekly (first high dose last week) - Levemir 60 units 2x a day.  - Novolog: - 20 units for a smaller meal - 25 units for a larger meal - Metformin 1000 mg 2x a day with meals.  She feels much better on the above regimen!  Pt checks her sugars 1x a day and they are much better in last 2 weeks: - am: 279, 300-376 >> 146-199, 313 (forgot Levemir) - 2h after b'fast: n/c - before lunch: 300s >> 228, 266 - 2h after lunch: n/c >> 184-255 - before dinner: 300s >> 125-188 - 2h after dinner: 326-371 >> 93, 115-272 - bedtime: n/c >> 150 - nighttime: n/c No lows. Lowest sugar was 160s (11/2015) >> 93; she has hypoglycemia awareness at 70.  Highest sugar was 390s >> 313.  Glucometer: One Touch ultra  Pt's meals are: - Breakfast: protein shakes (6g carbs); McDonads  - Lunch: may skip; salads cheeseburger; school cafeteria food - Dinner: fast food; porc chops + collard greens + salad; hamburger; McNuggets - Snacks: sometimes; sweets, nuts, chips  - no CKD, last BUN/creatinine:  Lab Results  Component Value Date   BUN 16 09/10/2016   BUN 18 05/07/2016   CREATININE 1.00 09/10/2016   CREATININE 0.98 05/07/2016   - last set of  lipids: Lab Results  Component Value Date   CHOL 154 09/10/2016   HDL 33 (L) 09/10/2016   LDLCALC 86 09/10/2016   TRIG 177 (H) 09/10/2016   CHOLHDL 4.7 09/10/2016  On Crestor. - last eye exam was in 2015. No DR. + glaucoma suspect. - + numbness and tingling in her feet. + burning.  She also has a history of HTN, HL.  She also has thyroid nodules - s/p benign biopsy. Reviewed most recent thyroid ultrasound from 01/03/2016: Dominant nodule is a right mid lobe hypoechoic nodule of 2.1 x 0.8 x 1.2 cm, and previously measured up to 2.3 cm. There is one left thyroid nodule measuring 6 mm and several other tiny scattered nodules.  ROS: Constitutional: no weight gain/loss, no fatigue, no subjective hyperthermia/hypothermia Eyes: + blurry vision, no xerophthalmia ENT: no sore throat, no nodules palpated in throat, no dysphagia/odynophagia, no hoarseness, + tinnitus Cardiovascular: no CP/SOB/palpitations/+ leg swelling Respiratory: no cough/SOB Gastrointestinal: no N/V/D/+ C Musculoskeletal: + both muscle/joint aches Skin: no rashes Neurological: no tremors/numbness/tingling/dizziness  I reviewed pt's medications, allergies, PMH, social hx, family hx, and changes were documented in the history of present illness. Otherwise, unchanged from my initial visit note.  Past Medical History:  Diagnosis Date  . Allergy    Zyrtec daily.  . Arthritis    DDD lumbar spine  .  Asthma    Albuterol seasonally.  . Diabetes mellitus without complication ([place])   . Glaucoma   . Heart murmur   . Hyperlipidemia   . Hypertension   . Neuromuscular disorder ([place])    R third nerve palsy  . OSA (obstructive sleep apnea) 11/11/2006   CPAP  . Thyroid disease    Thyroid nodules s/p needle biopsy negative.    Also, spondylolisthesis L4-L5  Past Surgical History:  Procedure Laterality Date  . CHOLECYSTECTOMY OPEN  1979   Social History   Social History  . Marital status: Divorced    Spouse name: N/A   . 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 Smoking status: Former Smoker    Packs/day: 0.05    Years: 18.00    Types: Cigarettes    Quit date: 02/2016  . Smokeless tobacco: Never Used  . Alcohol use 0.0 oz/week     Comment: maybe 1-2 drinks per yr  . Drug use: No  . Sexual activity: No   Other Topics Concern  . 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      Children:  None       Lives: alone, dog.       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  [place] work.      Tobacco: 1/2 pdd x 20 years.      Alcohol:  None      Drugs: none       Education: [employer].       Exercise: No.      Seatbelt: 100%      Guns: unloaded.      Sexually active: none; total partners = 5.  Last STD screen 2006.     Current Outpatient Prescriptions on File Prior to Visit  Medication Sig Dispense Refill  . albuterol (PROVENTIL HFA;VENTOLIN HFA) 108 (90 BASE) MCG/ACT inhaler Inhale 2 puffs into the lungs every 6 (six) hours as needed for wheezing. 1 Inhaler 3  . Ascorbic Acid (VITAMIN C) 100 MG tablet Take 100 mg by mouth daily.    Marland Kitchen aspirin 325 MG EC tablet Take 325 mg by mouth daily.    Marland Kitchen b complex vitamins tablet Take 1 tablet by mouth daily.    . cetirizine (ZYRTEC) 10 MG tablet Take 10 mg by mouth daily.    Marland Kitchen co-enzyme Q-10 30 MG capsule Take 30 mg by mouth 3 (three) times daily.     . Dulaglutide (TRULICITY) 1.5 0000000 SOPN Inject 1.5 mg under skin once a week 4 pen 5  . glucose blood (ONE TOUCH ULTRA TEST) test strip TESTING TWICE DAILY 100 each 3  . insulin aspart (NOVOLOG) 100 UNIT/ML injection Inject 20-25 Units into the skin 3 (three) times daily with meals. 4 vial 5  . Insulin Detemir (LEVEMIR) 100 UNIT/ML Pen Inject 60 Units into the skin 2 (two) times daily. 30 mL 5  . Insulin Pen Needle (BD PEN  NEEDLE NANO U/F) 32G X 4 MM MISC USE AS DIRECTED; inject insulin five times daily 100 each 3  . Insulin Syringe-Needle U-100 (BD INSULIN SYRINGE ULTRAFINE) 31G X 5/16" 0.5 ML MISC 1 Syringe by Does not apply route 3 (three) times daily. 100 each 11  . levocetirizine (XYZAL) 5 MG tablet Take 5 mg by mouth every  evening.    Marland Kitchen losartan-hydrochlorothiazide (HYZAAR) 100-12.5 MG tablet Take 1 tablet by mouth daily. 90 tablet 1  . metFORMIN (GLUCOPHAGE) 1000 MG tablet TAKE 1 TABLET (1,000 MG TOTAL) BY MOUTH 2 (TWO) TIMES DAILY. 180 tablet 3  . Multiple Vitamins-Minerals (MULTIVITAMIN WITH MINERALS) tablet Take 1 tablet by mouth daily.    . rosuvastatin (CRESTOR) 10 MG tablet Take 1 tablet (10 mg total) by mouth daily. 30 tablet 5  . Insulin Syringe-Needle U-100 (BD INSULIN SYRINGE ULTRAFINE) 31G X 15/64" 1 ML MISC USE TO GIVE 10-15 UNITS 3 (THREE) TIMES DAILY BEFORE MEALS. (Patient not taking: Reported on 12/12/2016) 300 each 2  . nystatin (MYCOSTATIN) 100000 UNIT/ML suspension Take 5 mLs (500,000 Units total) by mouth 4 (four) times daily. (Patient not taking: Reported on 09/10/2016) 200 mL 0   No current facility-administered medications on file prior to visit.    No Known Allergies Family History  Problem Relation Age of Onset  . Diabetes Mother   . Hypertension Mother   . Ovarian cancer Mother 19  . Breast cancer Mother 41  . Cancer Mother 86    ovarian cancer age 74; breast cancer age 38  . Diabetes Father   . Heart disease Father 55    CAD/CABG; AAA; CHF  . Hyperlipidemia Father   . Hypertension Father   . Prostate cancer Father     dx. 53-83  . Colon polyps Father     section of colon removed; unsure of #  . Heart disease Brother     Died of CHF at age 70.  Marland Kitchen Hyperlipidemia Brother   . Hypertension Brother   . Congestive Heart Failure Brother   . Heart disease Sister 36    3-vessel CAD s/p 1 stenting; CABG  . Diabetes Sister   . Hyperlipidemia Sister   . Hypertension Sister   .  Cancer Brother 28    GI- esophageal; smoker  . Heart disease Brother     MIs in 6s and CABG.  . Diabetes Brother   . Hypertension Brother   . Stroke Maternal Grandmother   . Skin cancer Maternal Grandfather     multiple - unknown types; dx. 23s  . Heart Problems Paternal Grandfather   . Skin cancer Maternal Aunt     multiple - unknown type; dx. 48s  . Pancreatic cancer Maternal Uncle     dx. 54s   PE: BP 124/72 (BP Location: Left Arm, Patient Position: Sitting)   Pulse 91   Ht 5\' 3"  (1.6 m)   Wt 215 lb (97.5 kg)   SpO2 97%   BMI 38.09 kg/m  Wt Readings from Last 3 Encounters:  12/12/16 215 lb (97.5 kg)  12/06/16 216 lb (98 kg)  11/01/16 217 lb 3.2 oz (98.5 kg)   Constitutional: overweight, in NAD Eyes: PERRLA, EOMI, no exophthalmos ENT: moist mucous membranes, no thyromegaly, no cervical lymphadenopathy Cardiovascular: RRR, No MRG Respiratory: CTA B Gastrointestinal: abdomen soft, NT, ND, BS+ Musculoskeletal: no deformities, strength intact in all 4 Skin: moist, warm, no rashes Neurological: no tremor with outstretched hands, DTR normal in all 4  ASSESSMENT: 1. DM2, insulin-dependent, uncontrolled, with complications - Peripheral neuropathy.  PLAN:  1. Patient with long-standing, uncontrolled diabetes, on basal-bolus insulin and oral diabetes regimen, and now also on Trulicity. Her sugars improved significantly since last visit and she feels much better especially in last 2 weeks. She only increased her Trulicity dose last week. - we discussed about poss. SEs of Trulicity >>  none evident for her - we may need to decrease her insulin after she gets to full effect of the high Trulicity dose >> I will see her back in 1.5 mo to adjust the doses - I suggested to:  Patient Instructions  Please continue: - Trulicity [id number] mcg weekly - Levemir 60 units 2x a day - Novolog: - 20 units for a smaller meal - 25 units for a larger meal - Metformin 1000 mg 2x a day with  meals.  Please return in 1.5 months with your sugar log.   - continue checking sugars at different times of the day - check 3 times a day, rotating checks - advised for yearly eye exams >> she needs a new one - Return to clinic in 1.5 mo with sugar log   Philemon Kingdom, MD PhD [facility] Endocr[facility]

## 2016-12-12 NOTE — Patient Instructions (Addendum)
Please continue: - Trulicity 1.5 mcg weekly  - Levemir 60 units 2x a day.  - Novolog: - 20 units for a smaller meal - 25 units for a larger meal - Metformin 1000 mg 2x a day with meals.  Please return in 1.5 months with your sugar log.

## 2016-12-12 NOTE — Addendum Note (Signed)
Addended by: Caprice Beaver T on: 12/12/2016 11:44 AM   Modules accepted: Orders

## 2017-02-04 ENCOUNTER — Encounter: Payer: Self-pay | Admitting: Internal Medicine

## 2017-02-04 ENCOUNTER — Ambulatory Visit (INDEPENDENT_AMBULATORY_CARE_PROVIDER_SITE_OTHER): Payer: BC Managed Care – PPO | Admitting: Internal Medicine

## 2017-02-04 VITALS — BP 140/84 | HR 99 | Wt 215.0 lb

## 2017-02-04 DIAGNOSIS — E1165 Type 2 diabetes mellitus with hyperglycemia: Secondary | ICD-10-CM | POA: Diagnosis not present

## 2017-02-04 DIAGNOSIS — E1142 Type 2 diabetes mellitus with diabetic polyneuropathy: Secondary | ICD-10-CM

## 2017-02-04 MED ORDER — EMPAGLIFLOZIN 25 MG PO TABS: 25.0000 mg | ORAL_TABLET | Freq: Every day | ORAL | 5 refills | Status: DC

## 2017-02-04 NOTE — Patient Instructions (Signed)
Please continue: - Trulicity 85.9 mcg weekly - Levemir 60 units 2x a day >> move this in am and with dinner - Novolog: - 20 units for a smaller meal - 25 units for a larger meal - Metformin 1000 mg 2x a day with meals.  Please add Jardiance 25 mg daily before b'fst.  Please return in 1.5 months with your sugar log.

## 2017-02-04 NOTE — Progress Notes (Signed)
Patient ID: Madison Cowan, female   DOB: 05-28-1961, 56 y.o.   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 Last visit 2 mo ago.  Last hemoglobin A1c was: Lab Results  Component Value Date   HGBA1C 9.3 12/12/2016   HGBA1C 11.3 09/10/2016   HGBA1C 13.3 05/07/2016   Pt was on a regimen of: - Metformin 1000 mg 2x a day, with meals - Levemir 70 units 2x a day - missed the evening Levemir frequently - Novolog 10-15 units 3x a day, before meals - missed this less often  At last visit we changed to: - Trulicity 1.5 mcg weekly  - Levemir 60 units 2x a day.  - Novolog: - 20 units for a smaller meal - 25 units for a larger meal - Metformin 1000 mg 2x a day with meals.  Pt checks her sugars 1x a day and they are: - am: 279, 300-376 >> 146-199, 313 (forgot Levemir) >> 137-238 - 2h after b'fast: n/c - before lunch: 300s >> 228, 266 >> 203 - 2h after lunch: n/c >> 184-255 >> n/c - before dinner: 300s >> 125-188 >> 125-182, 213 - 2h after dinner: 326-371 >> 93, 115-272 >> 123-194, 216 - bedtime: n/c >> 150 >> n/c - nighttime: n/c No lows. Lowest sugar was 160s (11/2015) >> 93 >> 123; she has hypoglycemia awareness at 70.  Highest sugar was 390s >> 313 >> 238  Glucometer: One Touch ultra  Pt's meals are: - Breakfast: protein shakes (6g carbs); Mc[employer]ads  - Lunch: may skip; salads cheeseburger; school cafeteria food - Dinner: fast food; porc chops + collard greens + salad; hamburger; McNuggets - Snacks: sometimes; sweets, nuts, chips  - no CKD, last BUN/creatinine:  Lab Results  Component Value Date   BUN 16 09/10/2016   BUN 18 05/07/2016   CREATININE 1.00 09/10/2016   CREATININE 0.98 05/07/2016   - last set of lipids: Lab Results  Component Value Date   CHOL 154 09/10/2016   HDL 33 (L) 09/10/2016   LDLCALC 86  09/10/2016   TRIG 177 (H) 09/10/2016   CHOLHDL 4.7 09/10/2016  On Crestor. - last eye exam was in 01/2017. No DR. + glaucoma suspect. - + numbness and tingling in her feet. + burning.  She also has a history of HTN, HL.  She also has thyroid nodules - s/p benign biopsy. Reviewed most recent thyroid ultrasound from 01/03/2016: Dominant nodule is a right mid lobe hypoechoic nodule of 2.1 x 0.8 x 1.2 cm, and previously measured up to 2.3 cm. There is one left thyroid nodule measuring 6 mm and several other tiny scattered nodules.  ROS: Constitutional: no weight gain/loss, no fatigue, no subjective hyperthermia/hypothermia Eyes: + blurry vision, no xerophthalmia ENT: no sore throat, no nodules palpated in throat, no dysphagia/odynophagia, no hoarseness, + tinnitus Cardiovascular: no CP/SOB/palpitations/+ leg swelling Respiratory: no cough/SOB Gastrointestinal: no N/V/D/+ C Musculoskeletal: + both muscle/joint aches Skin: no rashes Neurological: no tremors/numbness/tingling/dizziness  I reviewed pt's medications, allergies, PMH, social hx, family hx, and changes were documented in the history of present illness. Otherwise, unchanged from my initial visit note.  Past Medical History:  Diagnosis Date  . Allergy    Zyrtec daily.  . Arthritis    DDD lumbar spine  . Asthma    Albuterol seasonally.  . Diabetes mellitus without complication ([place])   .  Glaucoma   . Heart murmur   . Hyperlipidemia   . Hypertension   . Neuromuscular disorder ([place])    R third nerve palsy  . OSA (obstructive sleep apnea) 11/11/2006   CPAP  . Thyroid disease    Thyroid nodules s/p needle biopsy negative.    Also, spondylolisthesis L4-L5  Past Surgical History:  Procedure Laterality Date  . CHOLECYSTECTOMY OPEN  1979   Social History   Social History  . Marital status: Divorced    Spouse name: N/A  . 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 Smoking status: Former Smoker    Packs/day: 0.05    Years: 18.00    Types: Cigarettes    Quit date: 02/2016  . Smokeless tobacco: Never Used  . Alcohol use 0.0 oz/week     Comment: maybe 1-2 drinks per yr  . Drug use: No  . Sexual activity: No   Other Topics Concern  . 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      Children:  None       Lives: alone, dog.       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  [place] work.      Tobacco: 1/2 pdd x 20 years.      Alcohol:  None      Drugs: none       Education: [employer].       Exercise: No.      Seatbelt: 100%      Guns: unloaded.      Sexually active: none; total partners = 5.  Last STD screen 2006.     Current Outpatient Prescriptions on File Prior to Visit  Medication Sig Dispense Refill  . albuterol (PROVENTIL HFA;VENTOLIN HFA) 108 (90 BASE) MCG/ACT inhaler Inhale 2 puffs into the lungs every 6 (six) hours as needed for wheezing. 1 Inhaler 3  . Ascorbic Acid (VITAMIN C) 100 MG tablet Take 100 mg by mouth daily.    Marland Kitchen aspirin 325 MG EC tablet Take 325 mg by mouth daily.    Marland Kitchen b complex vitamins tablet Take 1 tablet by mouth daily.    . Dulaglutide (TRULICITY) 1.5 UM/3.5TI SOPN Inject 1.5 mg under skin once a week 4 pen 5  . glucose blood (ONE TOUCH ULTRA TEST) test strip TESTING TWICE DAILY 100 each 3  . insulin aspart (NOVOLOG) 100 UNIT/ML injection Inject 20-25 Units into the skin 3 (three) times daily with meals. 4 vial 5  . Insulin Detemir (LEVEMIR) 100 UNIT/ML Pen Inject 60 Units into the skin 2 (two) times daily. 30 mL 5  . Insulin Pen Needle (BD PEN NEEDLE NANO U/F) 32G X 4 MM MISC USE AS DIRECTED; inject insulin five times daily 100 each 3  . Insulin Syringe-Needle U-100 (BD INSULIN SYRINGE ULTRAFINE) 31G X 5/16" 0.5 ML MISC 1 Syringe by Does not apply route 3 (three) times daily. 100 each 11   . levocetirizine (XYZAL) 5 MG tablet Take 5 mg by mouth every evening.    Marland Kitchen losartan-hydrochlorothiazide (HYZAAR) 100-12.5 MG tablet Take 1 tablet by mouth daily. 90 tablet 1  . metFORMIN (GLUCOPHAGE) 1000 MG tablet TAKE 1 TABLET (1,000 MG TOTAL) BY MOUTH 2 (TWO) TIMES DAILY. 180 tablet 3  . Multiple Vitamins-Minerals (MULTIVITAMIN WITH MINERALS) tablet Take  1 tablet by mouth daily.    . rosuvastatin (CRESTOR) 10 MG tablet Take 1 tablet (10 mg total) by mouth daily. 30 tablet 5  . co-enzyme Q-10 30 MG capsule Take 30 mg by mouth 3 (three) times daily.     . Insulin Syringe-Needle U-100 (BD INSULIN SYRINGE ULTRAFINE) 31G X 15/64" 1 ML MISC USE TO GIVE 10-15 UNITS 3 (THREE) TIMES DAILY BEFORE MEALS. (Patient not taking: Reported on 12/12/2016) 300 each 2   No current facility-administered medications on file prior to visit.    No Known Allergies Family History  Problem Relation Age of Onset  . Diabetes Mother   . Hypertension Mother   . Ovarian cancer Mother 24  . Breast cancer Mother 26  . Cancer Mother 6    ovarian cancer age 46; breast cancer age 50  . Diabetes Father   . Heart disease Father 6    CAD/CABG; AAA; CHF  . Hyperlipidemia Father   . Hypertension Father   . Prostate cancer Father     dx. 23-83  . Colon polyps Father     section of colon removed; unsure of #  . Heart disease Brother     Died of CHF at age 55.  Marland Kitchen Hyperlipidemia Brother   . Hypertension Brother   . Congestive Heart Failure Brother   . Heart disease Sister 4    3-vessel CAD s/p 1 stenting; CABG  . Diabetes Sister   . Hyperlipidemia Sister   . Hypertension Sister   . Cancer Brother 31    GI- esophageal; smoker  . Heart disease Brother     MIs in 82s and CABG.  . Diabetes Brother   . Hypertension Brother   . Stroke Maternal Grandmother   . Skin cancer Maternal Grandfather     multiple - unknown types; dx. 76s  . Heart Problems Paternal Grandfather   . Skin cancer Maternal Aunt     multiple -  unknown type; dx. 21s  . Pancreatic cancer Maternal Uncle     dx. 41s   PE: BP 140/84 (BP Location: Left Arm, Patient Position: Sitting)   Pulse 99   Wt 215 lb (97.5 kg)   SpO2 97%   BMI 38.09 kg/m  Wt Readings from Last 3 Encounters:  02/04/17 215 lb (97.5 kg)  12/12/16 215 lb (97.5 kg)  12/06/16 216 lb (98 kg)   Constitutional: overweight, in NAD Eyes: PERRLA, EOMI, no exophthalmos ENT: moist mucous membranes, no thyromegaly, no cervical lymphadenopathy Cardiovascular: RRR, No MRG Respiratory: CTA B Gastrointestinal: abdomen soft, NT, ND, BS+ Musculoskeletal: no deformities, strength intact in all 4 Skin: moist, warm, no rashes Neurological: no tremor with outstretched hands, DTR normal in all 4  ASSESSMENT: 1. DM2, insulin-dependent, uncontrolled, with complications - Peripheral neuropathy.  PLAN:  1. Patient with long-standing, uncontrolled diabetes, on basal-bolus insulin + Metformin + Trulicity. Her sugars are still high likely 2/2 dietary indiscretions and forgetting Levemir >> will move second Levemir dose with dinner. Will also add Jardiance. we discussed about SEs of Jardiance, which are: dizziness (advised to be careful when stands from sitting position), decreased BP - usually not < normal (BP today is not low), and fungal UTIs (advised to let me know if develops one).  - given discount card for Jardiance - I suggested to:  Patient Instructions  Please continue: - Trulicity 78.2 mcg weekly - Levemir 60 units 2x a day >> move this in am and with dinner - Novolog: - 20 units  for a smaller meal - 25 units for a larger meal - Metformin 1000 mg 2x a day with meals.  Please add Jardiance 25 mg daily before b'fst.  Please return in 1.5 months with your sugar log.   - continue checking sugars at different times of the day - check 3 times a day, rotating checks - advised for yearly eye exams >> she is UTD - Return to clinic in 1.5 mo with sugar log   Philemon Kingdom, MD PhD [facility] Endocrinology

## 2017-03-18 ENCOUNTER — Encounter: Payer: Self-pay | Admitting: Family Medicine

## 2017-03-18 ENCOUNTER — Ambulatory Visit (INDEPENDENT_AMBULATORY_CARE_PROVIDER_SITE_OTHER): Payer: BC Managed Care – PPO | Admitting: Family Medicine

## 2017-03-18 ENCOUNTER — Ambulatory Visit: Payer: BC Managed Care – PPO | Admitting: Family Medicine

## 2017-03-18 VITALS — BP 124/76 | HR 89 | Temp 98.4°F | Resp 16 | Ht 62.5 in | Wt 209.2 lb

## 2017-03-18 DIAGNOSIS — G4733 Obstructive sleep apnea (adult) (pediatric): Secondary | ICD-10-CM | POA: Diagnosis not present

## 2017-03-18 DIAGNOSIS — E1142 Type 2 diabetes mellitus with diabetic polyneuropathy: Secondary | ICD-10-CM

## 2017-03-18 DIAGNOSIS — Z23 Encounter for immunization: Secondary | ICD-10-CM | POA: Diagnosis not present

## 2017-03-18 DIAGNOSIS — Z8041 Family history of malignant neoplasm of ovary: Secondary | ICD-10-CM

## 2017-03-18 DIAGNOSIS — Z0000 Encounter for general adult medical examination without abnormal findings: Secondary | ICD-10-CM | POA: Diagnosis not present

## 2017-03-18 DIAGNOSIS — I10 Essential (primary) hypertension: Secondary | ICD-10-CM

## 2017-03-18 DIAGNOSIS — E042 Nontoxic multinodular goiter: Secondary | ICD-10-CM | POA: Diagnosis not present

## 2017-03-18 DIAGNOSIS — E7800 Pure hypercholesterolemia, unspecified: Secondary | ICD-10-CM | POA: Diagnosis not present

## 2017-03-18 DIAGNOSIS — Z9989 Dependence on other enabling machines and devices: Secondary | ICD-10-CM | POA: Diagnosis not present

## 2017-03-18 DIAGNOSIS — Z1211 Encounter for screening for malignant neoplasm of colon: Secondary | ICD-10-CM

## 2017-03-18 DIAGNOSIS — Z803 Family history of malignant neoplasm of breast: Secondary | ICD-10-CM

## 2017-03-18 DIAGNOSIS — E1165 Type 2 diabetes mellitus with hyperglycemia: Secondary | ICD-10-CM | POA: Diagnosis not present

## 2017-03-18 LAB — POCT URINALYSIS DIP (MANUAL ENTRY)
BILIRUBIN UA: NEGATIVE
BILIRUBIN UA: NEGATIVE mg/dL
Blood, UA: NEGATIVE
Glucose, UA: 500 mg/dL — AB
Leukocytes, UA: NEGATIVE
Nitrite, UA: NEGATIVE
PROTEIN UA: NEGATIVE mg/dL
SPEC GRAV UA: 1.01 (ref 1.010–1.025)
Urobilinogen, UA: 0.2 E.U./dL
pH, UA: 5 (ref 5.0–8.0)

## 2017-03-18 NOTE — Patient Instructions (Addendum)
CALL TO SCHEDULE MAMMOGRAM.  CALL TO FOLLOW-UP WITH CARDIOLOGY.   IF you received an x-ray today, you will receive an invoice from [facility]. Please contact [facility] at [phone] with questions or concerns regarding your invoice.   IF you received labwork today, you will receive an invoice from [facility]. Please contact [facility] at [phone] with questions or concerns regarding your invoice.   Our billing staff will not be able to assist you with questions regarding bills from these companies.  You will be contacted with the lab results as soon as they are available. The fastest way to get your results is to activate your My Chart account. Instructions are located on the last page of this paperwork. If you have not heard from [place] regarding the results in 2 weeks, please contact this office.     Preventive Care 40-64 Years, Female Preventive care refers to lifestyle choices and visits with your health care provider that can promote health and wellness. What does preventive care include?  A yearly physical exam. This is also called an annual well check.  Dental exams once or twice a year.  Routine eye exams. Ask your health care provider how often you should have your eyes checked.  Personal lifestyle choices, including:  Daily care of your teeth and gums.  Regular physical activity.  Eating a healthy diet.  Avoiding tobacco and drug use.  Limiting alcohol use.  Practicing safe sex.  Taking low-dose aspirin daily starting at age 81.  Taking vitamin and mineral supplements as recommended by your health care provider. What happens during an annual well check? The services and screenings done by your health care provider during your annual well check will depend on your age, overall health, lifestyle risk factors, and family history of disease. Counseling  Your health care provider may ask you questions about your:  Alcohol use.  Tobacco  use.  Drug use.  Emotional well-being.  Home and relationship well-being.  Sexual activity.  Eating habits.  Work and work Statistician.  Method of birth control.  Menstrual cycle.  Pregnancy history. Screening  You may have the following tests or measurements:  Height, weight, and BMI.  Blood pressure.  Lipid and cholesterol levels. These may be checked every 5 years, or more frequently if you are over 24 years old.  Skin check.  Lung cancer screening. You may have this screening every year starting at age 48 if you have a 30-pack-year history of smoking and currently smoke or have quit within the past 15 years.  Fecal occult blood test (FOBT) of the stool. You may have this test every year starting at age 65.  Flexible sigmoidoscopy or colonoscopy. You may have a sigmoidoscopy every 5 years or a colonoscopy every 10 years starting at age 71.  Hepatitis C blood test.  Hepatitis B blood test.  Sexually transmitted disease (STD) testing.  Diabetes screening. This is done by checking your blood sugar (glucose) after you have not eaten for a while (fasting). You may have this done every 1-3 years.  Mammogram. This may be done every 1-2 years. Talk to your health care provider about when you should start having regular mammograms. This may depend on whether you have a family history of breast cancer.  BRCA-related cancer screening. This may be done if you have a family history of breast, ovarian, tubal, or peritoneal cancers.  Pelvic exam and Pap test. This may be done every 3 years starting at age 65. Starting at age  30, this may be done every 5 years if you have a Pap test in combination with an HPV test.  Bone density scan. This is done to screen for osteoporosis. You may have this scan if you are at high risk for osteoporosis. Discuss your test results, treatment options, and if necessary, the need for more tests with your health care provider. Vaccines  Your  health care provider may recommend certain vaccines, such as:  Influenza vaccine. This is recommended every year.  Tetanus, diphtheria, and acellular pertussis (Tdap, Td) vaccine. You may need a Td booster every 10 years.  Varicella vaccine. You may need this if you have not been vaccinated.  Zoster vaccine. You may need this after age 79.  Measles, mumps, and rubella (MMR) vaccine. You may need at least one dose of MMR if you were born in 1957 or later. You may also need a second dose.  Pneumococcal 13-valent conjugate (PCV13) vaccine. You may need this if you have certain conditions and were not previously vaccinated.  Pneumococcal polysaccharide (PPSV23) vaccine. You may need one or two doses if you smoke cigarettes or if you have certain conditions.  Meningococcal vaccine. You may need this if you have certain conditions.  Hepatitis A vaccine. You may need this if you have certain conditions or if you travel or work in places where you may be exposed to hepatitis A.  Hepatitis B vaccine. You may need this if you have certain conditions or if you travel or work in places where you may be exposed to hepatitis B.  Haemophilus influenzae type b (Hib) vaccine. You may need this if you have certain conditions. Talk to your health care provider about which screenings and vaccines you need and how often you need them. This information is not intended to replace advice given to you by your health care provider. Make sure you discuss any questions you have with your health care provider. Document Released: 11/24/2015 Document Revised: 07/17/2016 Document Reviewed: 08/29/2015 Elsevier Interactive Patient Education  2017 [employer].

## 2017-03-18 NOTE — Progress Notes (Signed)
Subjective:    Patient ID: Madison Cowan, female    DOB: 04-May-1961, 56 y.o.   MRN: [id number]  03/18/2017  Annual Exam and Medication Refill (Crestor 10 mg)   HPI This 56 y.o. female presents for Complete Physical Examination.  Last physical:  12-19-2015 Pap smear:  01-18-2015  WNL HPV negative. Mammogram:  10-18-2015 Jule Ser; will schedule Colonoscopy:  Never; overdue! Eye exam:  New glasses; April 2018 Dental exam:  Every six months.  Immunization History  Administered Date(s) Administered  . Hepatitis B, adult 01/18/2015, 02/23/2015, 09/08/2015  . Influenza Whole 11/25/2011  . Influenza,inj,Quad PF,36+ Mos 09/01/2013, 10/05/2014, 09/08/2015, 09/10/2016  . Pneumococcal Polysaccharide-23 11/11/2006, 09/08/2015  . Tdap 11/25/2006, 03/18/2017   BP Readings from Last 3 Encounters:  03/18/17 124/76  02/04/17 140/84  12/12/16 124/72   Wt Readings from Last 3 Encounters:  03/18/17 209 lb 3.2 oz (94.9 kg)  02/04/17 215 lb (97.5 kg)  12/12/16 215 lb (97.5 kg)    Review of Systems  Constitutional: Negative for activity change, appetite change, chills, diaphoresis, fatigue, fever and unexpected weight change.  HENT: Negative for congestion, dental problem, drooling, ear discharge, ear pain, facial swelling, hearing loss, mouth sores, nosebleeds, postnasal drip, rhinorrhea, sinus pressure, sneezing, sore throat, tinnitus, trouble swallowing and voice change.   Eyes: Negative for photophobia, pain, discharge, redness, itching and visual disturbance.  Respiratory: Negative for apnea, cough, choking, chest tightness, shortness of breath, wheezing and stridor.   Cardiovascular: Negative for chest pain, palpitations and leg swelling.  Gastrointestinal: Negative for abdominal distention, abdominal pain, anal bleeding, blood in stool, constipation, diarrhea, nausea, rectal pain and vomiting.  Endocrine: Negative for cold intolerance, heat intolerance, polydipsia, polyphagia and polyuria.   Genitourinary: Negative for decreased urine volume, difficulty urinating, dyspareunia, dysuria, enuresis, flank pain, frequency, genital sores, hematuria, menstrual problem, pelvic pain, urgency, vaginal bleeding, vaginal discharge and vaginal pain.       Nocturia x 0.  Urinary leakage YES.  Musculoskeletal: Positive for arthralgias. Negative for back pain, gait problem, joint swelling, myalgias, neck pain and neck stiffness.       B hands/wrists/elbows/shoulders  Skin: Negative for color change, pallor, rash and wound.  Allergic/Immunologic: Negative for environmental allergies, food allergies and immunocompromised state.  Neurological: Negative for dizziness, tremors, seizures, syncope, facial asymmetry, speech difficulty, weakness, light-headedness, numbness and headaches.  Hematological: Negative for adenopathy. Does not bruise/bleed easily.  Psychiatric/Behavioral: Positive for sleep disturbance. Negative for agitation, behavioral problems, confusion, decreased concentration, dysphoric mood, hallucinations, self-injury and suicidal ideas. The patient is not nervous/anxious and is not hyperactive.     Past Medical History:  Diagnosis Date  . Allergy    Zyrtec daily.  . Arthritis    DDD lumbar spine  . Asthma    Albuterol seasonally.  . Diabetes mellitus without complication ([place])   . Glaucoma   . Heart murmur   . Hyperlipidemia   . Hypertension   . Neuromuscular disorder ([place])    R third nerve palsy  . OSA (obstructive sleep apnea) 11/11/2006   CPAP  . Thyroid disease    Thyroid nodules s/p needle biopsy negative.     Past Surgical History:  Procedure Laterality Date  . CHOLECYSTECTOMY OPEN  1979   No Known Allergies  Social History   Social History  . Marital status: Divorced    Spouse name: N/A  . Number of children: 0  . 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 Smoking status: Former Smoker     Packs/day: 0.05    Years: 18.00    Types: Cigarettes    Quit date: 02/2016  . Smokeless tobacco: Never Used  . Alcohol use 0.0 oz/week     Comment: maybe 1-2 drinks per yr  . Drug use: No  . Sexual activity: No   Other Topics Concern  . Not on file   Social History Narrative   Marital status: Divorced after 25 years of marriage; not dating in 2018; not interested.      Children:  None       Lives: alone, 1 dog.       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  [place] work.  Works 50 hours per week.  No longer working on weekends in 2018.      Tobacco:   QUIT 03/01/2016!!!!!!  Smoked for 20 years      Alcohol:  None      Drugs: none       Education: [employer].       Exercise: No.      Seatbelt: 100%; no texting      Guns: unloaded.      Sexually active: none; total partners = 5.  Last STD screen 2006.     Family History  Problem Relation Age of Onset  . Diabetes Mother   . Hypertension Mother   . Ovarian cancer Mother 27  . Breast cancer Mother 62  . Cancer Mother 43       ovarian cancer age 1; breast cancer age 37  . Diabetes Father   . Heart disease Father 74       CAD/CABG; AAA; CHF  . Hyperlipidemia Father   . Hypertension Father   . Prostate cancer Father        dx. 80-83  . Colon polyps Father        section of colon removed; unsure of #  . Heart disease Brother        Died of CHF at age 39.  Marland Kitchen Hyperlipidemia Brother   . Hypertension Brother   . Congestive Heart Failure Brother   . Heart disease Sister 74       3-vessel CAD s/p 1 stenting; CABG  . Diabetes Sister   . Hyperlipidemia Sister   . Hypertension Sister   . Cancer Brother 37       GI- esophageal; smoker  . Heart disease Brother        MIs in 20s and CABG.  . Diabetes Brother   . Hypertension Brother   . Stroke Maternal Grandmother   . Hypertension Maternal Grandmother   . Skin cancer Maternal Grandfather        multiple - unknown types; dx. 54s    . Cancer Maternal Grandfather   . Heart Problems Paternal Grandfather   . Skin cancer Maternal Aunt        multiple - unknown type; dx. 48s  . Pancreatic cancer Maternal Uncle        dx. 70s       Objective:    BP 124/76   Pulse 89   Temp 98.4 F (36.9 C) (Oral)   Resp 16   Ht 5' 2.5" (1.588 m)   Wt 209 lb 3.2 oz (94.9 kg)   SpO2 98%   BMI 37.65 kg/m  Physical Exam  Constitutional: She is oriented to person, place, and time. She appears  well-developed and well-nourished. No distress.  HENT:  Head: Normocephalic and atraumatic.  Right Ear: External ear normal.  Left Ear: External ear normal.  Nose: Nose normal.  Mouth/Throat: Oropharynx is clear and moist.  Eyes: Conjunctivae and EOM are normal. Pupils are equal, round, and reactive to light.  Neck: Normal range of motion and full passive range of motion without pain. Neck supple. No JVD present. Carotid bruit is not present. No thyromegaly present.  Cardiovascular: Normal rate, regular rhythm and normal heart sounds.  Exam reveals no gallop and no friction rub.   No murmur heard. Pulmonary/Chest: Effort normal and breath sounds normal. She has no wheezes. She has no rales.  Abdominal: Soft. Bowel sounds are normal. She exhibits no distension and no mass. There is no tenderness. There is no rebound and no guarding.  Musculoskeletal:       Right shoulder: Normal.       Left shoulder: Normal.       Cervical back: Normal.  Lymphadenopathy:    She has no cervical adenopathy.  Neurological: She is alert and oriented to person, place, and time. She has normal reflexes. No cranial nerve deficit. She exhibits normal muscle tone. Coordination normal.  Skin: Skin is warm and dry. No rash noted. She is not diaphoretic. No erythema. No pallor.  Psychiatric: She has a normal mood and affect. Her behavior is normal. Judgment and thought content normal.  Nursing note and vitals reviewed.  Results for orders placed or performed in  visit on 03/18/17  CBC with Differential/Platelet  Result Value Ref Range   WBC 11.1 (H) 3.4 - 10.8 x10E3/uL   RBC 5.04 3.77 - 5.28 x10E6/uL   Hemoglobin 14.5 11.1 - 15.9 g/dL   Hematocrit 43.2 34.0 - 46.6 %   MCV 86 79 - 97 fL   MCH 28.8 26.6 - 33.0 pg   MCHC 33.6 31.5 - 35.7 g/dL   RDW 14.7 12.3 - 15.4 %   Platelets 280 150 - 379 x10E3/uL   Neutrophils 56 Not Estab. %   Lymphs 36 Not Estab. %   Monocytes 5 Not Estab. %   Eos 3 Not Estab. %   Basos 0 Not Estab. %   Neutrophils Absolute 6.2 1.4 - 7.0 x10E3/uL   Lymphocytes Absolute 3.9 (H) 0.7 - 3.1 x10E3/uL   Monocytes Absolute 0.6 0.1 - 0.9 x10E3/uL   EOS (ABSOLUTE) 0.3 0.0 - 0.4 x10E3/uL   Basophils Absolute 0.0 0.0 - 0.2 x10E3/uL   Immature Granulocytes 0 Not Estab. %   Immature Grans (Abs) 0.0 0.0 - 0.1 x10E3/uL  Comprehensive metabolic panel  Result Value Ref Range   Glucose 130 (H) 65 - 99 mg/dL   BUN 17 6 - 24 mg/dL   Creatinine, Ser 1.07 (H) 0.57 - 1.00 mg/dL   GFR calc non Af Amer 58 (L) >59 mL/min/1.73   GFR calc Af Amer 67 >59 mL/min/1.73   BUN/Creatinine Ratio 16 9 - 23   Sodium 140 134 - 144 mmol/L   Potassium 4.4 3.5 - 5.2 mmol/L   Chloride 98 96 - 106 mmol/L   CO2 26 18 - 29 mmol/L   Calcium 10.2 8.7 - 10.2 mg/dL   Total Protein 7.3 6.0 - 8.5 g/dL   Albumin 4.5 3.5 - 5.5 g/dL   Globulin, Total 2.8 1.5 - 4.5 g/dL   Albumin/Globulin Ratio 1.6 1.2 - 2.2   Bilirubin Total 0.5 0.0 - 1.2 mg/dL   Alkaline Phosphatase 63 39 - 117 IU/L  AST 27 0 - 40 IU/L   ALT 29 0 - 32 IU/L  Lipid panel  Result Value Ref Range   Cholesterol, Total 109 100 - 199 mg/dL   Triglycerides 112 0 - 149 mg/dL   HDL 34 (L) >39 mg/dL   VLDL Cholesterol Cal 22 5 - 40 mg/dL   LDL Calculated 53 0 - 99 mg/dL   Chol/HDL Ratio 3.2 0.0 - 4.4 ratio  Microalbumin, urine  Result Value Ref Range   Albumin, Urine 5.7 Not Estab. ug/mL  TSH  Result Value Ref Range   TSH 2.280 0.450 - 4.500 uIU/mL  POCT urinalysis dipstick  Result Value  Ref Range   Color, UA yellow yellow   Clarity, UA clear clear   Glucose, UA =500 (A) negative mg/dL   Bilirubin, UA negative negative   Ketones, POC UA negative negative mg/dL   Spec Grav, UA 1.010 1.010 - 1.025   Blood, UA negative negative   pH, UA 5.0 5.0 - 8.0   Protein Ur, POC negative negative mg/dL   Urobilinogen, UA 0.2 0.2 or 1.0 E.U./dL   Nitrite, UA Negative Negative   Leukocytes, UA Negative Negative   Depression screen [facility] 2/9 03/18/2017 12/06/2016 09/10/2016 05/07/2016 12/19/2015  Decreased Interest 0 0 0 1 0  Down, Depressed, Hopeless 0 0 0 1 0  PHQ - 2 Score 0 0 0 2 0  Altered sleeping - - - 2 -  Tired, decreased energy - - - 2 -  Change in appetite - - - 1 -  Feeling bad or failure about yourself  - - - 1 -  Trouble concentrating - - - 3 -  Moving slowly or fidgety/restless - - - 2 -  Suicidal thoughts - - - 0 -  PHQ-9 Score - - - 13 -   Fall Risk  03/18/2017 12/06/2016 09/10/2016 05/07/2016 12/19/2015  Falls in the past year? No No No Yes No  Number falls in past yr: - - - 1 -  Injury with Fall? - - - No -       Assessment & Plan:   1. Routine physical examination   2. HYPERTENSION, BENIGN   3. OSA on CPAP   4. Multinodular goiter   5. Poorly controlled type 2 diabetes mellitus with peripheral neuropathy ([place])   6. Pure hypercholesterolemia   7. Family history of breast cancer in mother   6. Family history of ovarian cancer   9. Colon cancer screening   10. Need for Tdap vaccination    -anticipatory guidance provided --- exercise, weight loss, safe driving practices, aspirin 81mg  daily. -obtain age appropriate screening labs and labs for chronic disease management. -refer for colonoscopy. -pt to schedule mammogram. -s/p TDAP   Orders Placed This Encounter  Procedures  . US THYROID    Epic order/ [id number] NEEDS/INS-BCBS/CLC/PT    Standing Status:   Future    Standing Expiration Date:   05/18/2018    Order Specific Question:   Reason for Exam (SYMPTOM   OR DIAGNOSIS REQUIRED)    Answer:   one year follow-up of thyroid nodules    Order Specific Question:   Preferred imaging location?    Answer:   [facility]  . Tdap vaccine greater than or equal to 7yo IM  . CBC with Differential/Platelet  . Comprehensive metabolic panel    Order Specific Question:   Has the patient fasted?    Answer:   Yes  . Lipid panel  Order Specific Question:   Has the patient fasted?    Answer:   Yes  . Microalbumin, urine  . TSH  . Ambulatory referral to Gastroenterology    Referral Priority:   Routine    Referral Type:   Consultation    Referral Reason:   Specialty Services Required    Number of Visits Requested:   1  . POCT urinalysis dipstick   Meds ordered this encounter  Medications  . losartan-hydrochlorothiazide (HYZAAR) 100-12.5 MG tablet    Sig: Take 1 tablet by mouth daily.    Dispense:  90 tablet    Refill:  1    Return in about 6 months (around 09/18/2017) for recheck BLOOD PRESSURE AND CHOSTEROL.   Maccoy Haubner Elayne Guerin, M.D. Primary Care at [facility] previously [facility] [place] [address] [place], [place]  [address] [phone] phone [phone] fax

## 2017-03-19 LAB — COMPREHENSIVE METABOLIC PANEL
ALT: 29 IU/L (ref 0–32)
AST: 27 IU/L (ref 0–40)
Albumin/Globulin Ratio: 1.6 (ref 1.2–2.2)
Albumin: 4.5 g/dL (ref 3.5–5.5)
Alkaline Phosphatase: 63 IU/L (ref 39–117)
BILIRUBIN TOTAL: 0.5 mg/dL (ref 0.0–1.2)
BUN/Creatinine Ratio: 16 (ref 9–23)
BUN: 17 mg/dL (ref 6–24)
CALCIUM: 10.2 mg/dL (ref 8.7–10.2)
CHLORIDE: 98 mmol/L (ref 96–106)
CO2: 26 mmol/L (ref 18–29)
Creatinine, Ser: 1.07 mg/dL — ABNORMAL HIGH (ref 0.57–1.00)
GFR calc non Af Amer: 58 mL/min/{1.73_m2} — ABNORMAL LOW (ref >59)
GFR, EST AFRICAN AMERICAN: 67 mL/min/{1.73_m2} (ref >59)
GLUCOSE: 130 mg/dL — AB (ref 65–99)
Globulin, Total: 2.8 g/dL (ref 1.5–4.5)
Potassium: 4.4 mmol/L (ref 3.5–5.2)
Sodium: 140 mmol/L (ref 134–144)
TOTAL PROTEIN: 7.3 g/dL (ref 6.0–8.5)

## 2017-03-19 LAB — CBC WITH DIFFERENTIAL/PLATELET
BASOS ABS: 0 10*3/uL (ref 0.0–0.2)
BASOS: 0 %
EOS (ABSOLUTE): 0.3 10*3/uL (ref 0.0–0.4)
EOS: 3 %
Hematocrit: 43.2 % (ref 34.0–46.6)
Hemoglobin: 14.5 g/dL (ref 11.1–15.9)
IMMATURE GRANS (ABS): 0 10*3/uL (ref 0.0–0.1)
IMMATURE GRANULOCYTES: 0 %
LYMPHS: 36 %
Lymphocytes Absolute: 3.9 10*3/uL — ABNORMAL HIGH (ref 0.7–3.1)
MCH: 28.8 pg (ref 26.6–33.0)
MCHC: 33.6 g/dL (ref 31.5–35.7)
MCV: 86 fL (ref 79–97)
MONOCYTES: 5 %
Monocytes Absolute: 0.6 10*3/uL (ref 0.1–0.9)
NEUTROS ABS: 6.2 10*3/uL (ref 1.4–7.0)
Neutrophils: 56 %
PLATELETS: 280 10*3/uL (ref 150–379)
RBC: 5.04 x10E6/uL (ref 3.77–5.28)
RDW: 14.7 % (ref 12.3–15.4)
WBC: 11.1 10*3/uL — ABNORMAL HIGH (ref 3.4–10.8)

## 2017-03-19 LAB — TSH: TSH: 2.28 u[IU]/mL (ref 0.450–4.500)

## 2017-03-19 LAB — MICROALBUMIN, URINE: MICROALBUM., U, RANDOM: 5.7 ug/mL

## 2017-03-19 LAB — LIPID PANEL
Chol/HDL Ratio: 3.2 ratio (ref 0.0–4.4)
Cholesterol, Total: 109 mg/dL (ref 100–199)
HDL: 34 mg/dL — AB (ref >39)
LDL Calculated: 53 mg/dL (ref 0–99)
Triglycerides: 112 mg/dL (ref 0–149)
VLDL CHOLESTEROL CAL: 22 mg/dL (ref 5–40)

## 2017-03-29 ENCOUNTER — Other Ambulatory Visit: Payer: Self-pay | Admitting: Family Medicine

## 2017-04-07 MED ORDER — LOSARTAN POTASSIUM-HCTZ 100-12.5 MG PO TABS: 1.0000 | ORAL_TABLET | Freq: Every day | ORAL | 1 refills | Status: DC

## 2017-04-21 ENCOUNTER — Encounter: Payer: Self-pay | Admitting: Family Medicine

## 2017-04-23 ENCOUNTER — Encounter: Payer: Self-pay | Admitting: Internal Medicine

## 2017-04-23 ENCOUNTER — Ambulatory Visit (INDEPENDENT_AMBULATORY_CARE_PROVIDER_SITE_OTHER): Payer: BC Managed Care – PPO | Admitting: Internal Medicine

## 2017-04-23 VITALS — BP 134/80 | HR 90 | Ht 63.0 in | Wt 207.0 lb

## 2017-04-23 DIAGNOSIS — E1165 Type 2 diabetes mellitus with hyperglycemia: Secondary | ICD-10-CM

## 2017-04-23 DIAGNOSIS — E1142 Type 2 diabetes mellitus with diabetic polyneuropathy: Secondary | ICD-10-CM | POA: Diagnosis not present

## 2017-04-23 LAB — POCT GLYCOSYLATED HEMOGLOBIN (HGB A1C): Hemoglobin A1C: 6.4

## 2017-04-23 NOTE — Patient Instructions (Addendum)
Please continue: - Trulicity 20.2 mcg weekly - Levemir 60 units 2x a day  - Novolog: - 20 units for a smaller meal - 25 units for a larger meal - Metformin 1000 mg 2x a day with meals. - Jardiance 25 mg daily before b'fast.  Please let me know if the sugars stabilize under 120, to start decreasing the insulin.  Please return in 3 months with your sugar log.

## 2017-04-23 NOTE — Addendum Note (Signed)
Addended by: Caprice Beaver T on: 04/23/2017 11:49 AM   Modules accepted: Orders

## 2017-04-23 NOTE — Progress Notes (Signed)
Patient ID: Madison Cowan, female   DOB: [birth year], 56 y.o.   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 Last visit 2.5 mo ago.  Last hemoglobin A1c was: Lab Results  Component Value Date   HGBA1C 9.3 12/12/2016   HGBA1C 11.3 09/10/2016   HGBA1C 13.3 05/07/2016   Pt was on a regimen of: - Metformin 1000 mg 2x a day, with meals - Levemir 70 units 2x a day - missed the evening Levemir frequently - Novolog 10-15 units 3x a day, before meals - missed this less often  Now on: - Trulicity 1.5 mcg weekly  - Levemir 60 units 2x a day.  - Novolog: - 20 units for a smaller meal - 25 units for a larger meal - Metformin 1000 mg 2x a day with meals - Jardiance 25 mg daily >> started 01/2017  - subseq. Potassium normal  Pt checks her sugars 1-2x a day: - am: 279, 300-376 >> 146-199, 313 (forgot Levemir) >> 137-238 >> 114, 128-176, 211 - 2h after b'fast: n/c >> 171 - before lunch: 300s >> 228, 266 >> 203 >> 191 - 2h after lunch: n/c >> 184-255 >> n/c - before dinner: 300s >> 125-188 >> 125-182, 213 >> 113-135, 195 - 2h after dinner: 326-371 >> 93, 115-272 >> 123-194, 216 >> 181 - bedtime: n/c >> 150 >> n/c - nighttime: n/c No lows. Lowest sugar was 160s (11/2015) >> 93 >> 123 >> 114; she has hypoglycemia awareness at 70.  Highest sugar was 390s >> 313 >> 238 >> 211  Glucometer: One Touch ultra  Pt's meals are: - Breakfast: protein shakes (6g carbs); McDonads  - Lunch: may skip; salads cheeseburger; school cafeteria food - Dinner: fast food; porc chops + collard greens + salad; hamburger; McNuggets - Snacks: sometimes; sweets, nuts, chips  - + mild CKD, last BUN/creatinine:  Lab Results  Component Value Date   BUN 17 03/18/2017   BUN 16 09/10/2016   CREATININE 1.07 (H) 03/18/2017   CREATININE 1.00 09/10/2016    - last set of lipids: Lab Results  Component Value Date   CHOL 109 03/18/2017   HDL 34 (L) 03/18/2017   LDLCALC 53 03/18/2017   TRIG 112 03/18/2017   CHOLHDL 3.2 03/18/2017  On Crestor. - last eye exam was in 01/2017. No DR.. + glaucoma suspect. - she has numbness and tingling in her feet. + burning. On B vitamins.  She also has a history of HTN, HL.  She also has thyroid nodules - s/p benign biopsy. Reviewed most recent thyroid ultrasound (01/03/2016): Dominant nodule is a right mid lobe hypoechoic nodule of 2.1 x 0.8 x 1.2 cm, and previously measured up to 2.3 cm. There is one left thyroid nodule measuring 6 mm and several other tiny scattered nodules.  ROS: Constitutional: + weight loss (8 lbs since last visit), no fatigue, no subjective hyperthermia, no subjective hypothermia Eyes: no blurry vision, no xerophthalmia ENT: no sore throat, no nodules palpated in throat, no dysphagia, no odynophagia, no hoarseness Cardiovascular: no CP/no SOB/+ palpitations/no leg swelling Respiratory: no cough/no SOB/no wheezing Gastrointestinal: no N/no V/no D/+ C/no acid reflux Musculoskeletal: no muscle aches/+ joint aches Skin: no rashes, no hair loss Neurological: no tremors/no numbness/no tingling/no dizziness, + HA  I reviewed pt's medications, allergies, PMH, social hx, family hx, and changes were documented  in the history of present illness. Otherwise, unchanged from my initial visit note.   Past Medical History:  Diagnosis Date  . Allergy    Zyrtec daily.  . Arthritis    DDD lumbar spine  . Asthma    Albuterol seasonally.  . Diabetes mellitus without complication ([place])   . Glaucoma   . Heart murmur   . Hyperlipidemia   . Hypertension   . Neuromuscular disorder ([place])    R third nerve palsy  . OSA (obstructive sleep apnea) 11/11/2006   CPAP  . Thyroid disease    Thyroid nodules s/p needle biopsy negative.    Also, spondylolisthesis L4-L5  Past Surgical History:   Procedure Laterality Date  . CHOLECYSTECTOMY OPEN  1979   Social History   Social History  . Marital status: Divorced    Spouse name: N/A  . 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 Smoking status: Former Smoker    Packs/day: 0.05    Years: 18.00    Types: Cigarettes    Quit date: 02/2016  . Smokeless tobacco: Never Used  . Alcohol use 0.0 oz/week     Comment: maybe 1-2 drinks per yr  . Drug use: No  . Sexual activity: No   Other Topics Concern  . 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      Children:  None       Lives: alone, dog.       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      Tobacco: 1/2 pdd x 20 years.      Alcohol:  None      Drugs: none       Education: [employer].       Exercise: No.      Seatbelt: 100%      Guns: unloaded.      Sexually active: none; total partners = 5.  Last STD screen 2006.     Current Outpatient Prescriptions on File Prior to Visit  Medication Sig Dispense Refill  . Ascorbic Acid (VITAMIN C) 100 MG tablet Take 100 mg by mouth daily.    Marland Kitchen aspirin 325 MG EC tablet Take 325 mg by mouth daily.    Marland Kitchen b complex vitamins tablet Take 1 tablet by mouth daily.    Marland Kitchen co-enzyme Q-10 30 MG capsule Take 30 mg by mouth 3 (three) times daily.     . Dulaglutide (TRULICITY) 1.5 GG/2.6RS SOPN Inject 1.5 mg under skin once a week 4 pen 5  . empagliflozin (JARDIANCE) 25 MG TABS tablet Take 25 mg by mouth daily. 30 tablet 5  . glucose blood (ONE TOUCH ULTRA TEST) test strip TESTING TWICE DAILY 100 each 3  . insulin aspart (NOVOLOG) 100 UNIT/ML injection Inject 20-25 Units into the skin 3 (three) times daily with meals. 4 vial 5  . Insulin Detemir (LEVEMIR) 100 UNIT/ML Pen Inject 60 Units into the skin 2 (two) times daily. 30 mL 5  . Insulin Pen Needle  (BD PEN NEEDLE NANO U/F) 32G X 4 MM MISC USE AS DIRECTED; inject insulin five times daily 100 each 3  . Insulin Syringe-Needle U-100 (BD INSULIN SYRINGE ULTRAFINE) 31G X 15/64" 1 ML MISC USE TO GIVE 10-15 UNITS 3 (THREE) TIMES DAILY BEFORE MEALS. 300 each  2  . Insulin Syringe-Needle U-100 (BD INSULIN SYRINGE ULTRAFINE) 31G X 5/16" 0.5 ML MISC 1 Syringe by Does not apply route 3 (three) times daily. 100 each 11  . levocetirizine (XYZAL) 5 MG tablet Take 5 mg by mouth every evening.    Marland Kitchen losartan-hydrochlorothiazide (HYZAAR) 100-12.5 MG tablet Take 1 tablet by mouth daily. 90 tablet 1  . metFORMIN (GLUCOPHAGE) 1000 MG tablet TAKE 1 TABLET (1,000 MG TOTAL) BY MOUTH 2 (TWO) TIMES DAILY. 180 tablet 3  . Multiple Vitamins-Minerals (MULTIVITAMIN WITH MINERALS) tablet Take 1 tablet by mouth daily.    . rosuvastatin (CRESTOR) 10 MG tablet TAKE ONE TABLET BY MOUTH DAILY 90 tablet 3   No current facility-administered medications on file prior to visit.    No Known Allergies Family History  Problem Relation Age of Onset  . Diabetes Mother   . Hypertension Mother   . Ovarian cancer Mother 38  . Breast cancer Mother 29  . Cancer Mother 49       ovarian cancer age 26; breast cancer age 50  . Diabetes Father   . Heart disease Father 6       CAD/CABG; AAA; CHF  . Hyperlipidemia Father   . Hypertension Father   . Prostate cancer Father        dx. 28-83  . Colon polyps Father        section of colon removed; unsure of #  . Heart disease Brother        Died of CHF at age 25.  Marland Kitchen Hyperlipidemia Brother   . Hypertension Brother   . Congestive Heart Failure Brother   . Heart disease Sister 14       3-vessel CAD s/p 1 stenting; CABG  . Diabetes Sister   . Hyperlipidemia Sister   . Hypertension Sister   . Cancer Brother 70       GI- esophageal; smoker  . Heart disease Brother        MIs in 12s and CABG.  . Diabetes Brother   . Hypertension Brother   . Stroke Maternal Grandmother   . Hypertension  Maternal Grandmother   . Skin cancer Maternal Grandfather        multiple - unknown types; dx. 46s  . Cancer Maternal Grandfather   . Heart Problems Paternal Grandfather   . Skin cancer Maternal Aunt        multiple - unknown type; dx. 57s  . Pancreatic cancer Maternal Uncle        dx. 11s   PE: BP 134/80 (BP Location: Left Arm, Patient Position: Sitting)   Pulse 90   Ht 5\' 3"  (1.6 m)   Wt 207 lb (93.9 kg)   SpO2 95%   BMI 36.67 kg/m   Wt Readings from Last 3 Encounters:  04/23/17 207 lb (93.9 kg)  03/18/17 209 lb 3.2 oz (94.9 kg)  02/04/17 215 lb (97.5 kg)   Constitutional: overweight, in NAD Eyes: PERRLA, EOMI, no exophthalmos ENT: moist mucous membranes, no thyromegaly, no cervical lymphadenopathy Cardiovascular: RRR, No MRG Respiratory: CTA B Gastrointestinal: abdomen soft, NT, ND, BS+ Musculoskeletal: no deformities, strength intact in all 4 Skin: moist, warm, no rashes Neurological: no tremor with outstretched hands, DTR normal in all 4  ASSESSMENT: 1. DM2, insulin-dependent, uncontrolled, with complications - Peripheral neuropathy.  PLAN:  1. Patient with long-standing, uncontrolled diabetes, on basal-bolus insulin + Metformin + Trulicity and we added Jardiance at last visit. Sugars are improving and she feels well. Recent potassium  was normal 1 mo ago.  - we discussed that if she continues to do as well as now >> we can start reducing the Levemir +/- NovoLog at next visit. Before then, she should let me know if she starts having lower CBGs. - I suggested to:  Patient Instructions  Please continue: - Trulicity 69.4 mcg weekly - Levemir 60 units 2x a day  - Novolog: - 20 units for a smaller meal - 25 units for a larger meal - Metformin 1000 mg 2x a day with meals. - Jardiance 25 mg daily before b'fast.  Please let me know if the sugars stabilize under 120, to start decreasing the insulin.  Please return in 3 months with your sugar log.   - today, HbA1c is  6.4% (excellent improvement) - continue checking sugars at different times of the day - check 3x a day, rotating checks - advised for yearly eye exams >> she is UTD - Return to clinic in 3 mo with sugar log    Philemon Kingdom, MD PhD [facility] Endocrinology

## 2017-04-29 ENCOUNTER — Ambulatory Visit
Admission: RE | Admit: 2017-04-29 | Discharge: 2017-04-29 | Disposition: A | Discharge status: Home / Self Care | Payer: BC Managed Care – PPO | Source: Ambulatory Visit | Attending: Family Medicine | Admitting: Family Medicine

## 2017-04-29 DIAGNOSIS — E042 Nontoxic multinodular goiter: Secondary | ICD-10-CM

## 2017-05-17 ENCOUNTER — Ambulatory Visit (INDEPENDENT_AMBULATORY_CARE_PROVIDER_SITE_OTHER): Payer: BC Managed Care – PPO | Admitting: Physician Assistant

## 2017-05-17 ENCOUNTER — Encounter: Payer: Self-pay | Admitting: Physician Assistant

## 2017-05-17 VITALS — BP 131/83 | HR 95 | Temp 98.7°F | Resp 16 | Ht 63.0 in | Wt 210.8 lb

## 2017-05-17 DIAGNOSIS — J029 Acute pharyngitis, unspecified: Secondary | ICD-10-CM

## 2017-05-17 DIAGNOSIS — J028 Acute pharyngitis due to other specified organisms: Secondary | ICD-10-CM | POA: Diagnosis not present

## 2017-05-17 LAB — POCT RAPID STREP A (OFFICE): Rapid Strep A Screen: NEGATIVE

## 2017-05-17 NOTE — Patient Instructions (Addendum)
Please continue to gargle with warm salt water. Please take ibuprofen or tylenol for your throat pain or fever.  Use cepacol sore throat lozenges for your throat pain.   Pharyngitis Pharyngitis is a sore throat (pharynx). There is redness, pain, and swelling of your throat. Follow these instructions at home:  Drink enough fluids to keep your pee (urine) clear or pale yellow.  Only take medicine as told by your doctor. ? You may get sick again if you do not take medicine as told. Finish your medicines, even if you start to feel better. ? Do not take aspirin.  Rest.  Rinse your mouth (gargle) with salt water ( tsp of salt per 1 qt of water) every 1-2 hours. This will help the pain.  If you are not at risk for choking, you can suck on hard candy or sore throat lozenges. Contact a doctor if:  You have large, tender lumps on your neck.  You have a rash.  You cough up green, yellow-brown, or bloody spit. Get help right away if:  You have a stiff neck.  You drool or cannot swallow liquids.  You throw up (vomit) or are not able to keep medicine or liquids down.  You have very bad pain that does not go away with medicine.  You have problems breathing (not from a stuffy nose). This information is not intended to replace advice given to you by your health care provider. Make sure you discuss any questions you have with your health care provider. Document Released: 04/15/2008 Document Revised: 04/04/2016 Document Reviewed: 07/05/2013 Elsevier Interactive Patient Education  2017 [employer].   IF you received an x-ray today, you will receive an invoice from [facility]. Please contact [facility] at [phone] with questions or concerns regarding your invoice.   IF you received labwork today, you will receive an invoice from [facility]. Please contact [facility] at [phone] with questions or concerns regarding your invoice.   Our billing staff will not be able  to assist you with questions regarding bills from these companies.  You will be contacted with the lab results as soon as they are available. The fastest way to get your results is to activate your My Chart account. Instructions are located on the last page of this paperwork. If you have not heard from [place] regarding the results in 2 weeks, please contact this office.

## 2017-05-18 NOTE — Progress Notes (Signed)
PRIMARY CARE AT [place], [place] [address] [phone]  Date:  05/17/2017   Name:  Madison Cowan   DOB:  [birth year]   MRN:  [id number]  PCP:  Wardell Honour, MD    History of Present Illness:  Madison Cowan is a 56 y.o. female patient who presents to PCP with  Chief Complaint  Patient presents with  . Sore Throat    4 days ago, white patches      Sore throat that has worsened over the last 4 days.  She notes white patches on her tonsils.  There is mild nasal congestion and rare cough.  No known fever.  She has had a hx of tonsillar changes that were not strep.  Denies heavy fatigue.  No exposure to sick contacts known, or to mono.    Patient Active Problem List   Diagnosis Date Noted  . Poorly controlled type 2 diabetes mellitus with peripheral neuropathy ([place]) 11/01/2016  . OSA on CPAP 09/08/2015  . Family history of colonic polyps 07/28/2015  . Genetic testing 07/28/2015  . Family history of breast cancer in mother 02/04/2015  . Family history of ovarian cancer 02/04/2015  . Spondylolisthesis at L4-L5 level 05/06/2014  . Multinodular goiter 11/26/2011  . Gout 11/26/2011  . Hyperlipidemia 09/10/2010  . Overweight 09/10/2010  . TOBACCO ABUSE 09/10/2010  . HYPERTENSION, BENIGN 09/10/2010  . SLEEP APNEA 09/10/2010  . PALPITATIONS 09/10/2010  . PND 09/10/2010    Past Medical History:  Diagnosis Date  . Allergy    Zyrtec daily.  . Arthritis    DDD lumbar spine  . Asthma    Albuterol seasonally.  . Diabetes mellitus without complication ([place])   . Glaucoma   . Heart murmur   . Hyperlipidemia   . Hypertension   . Neuromuscular disorder ([place])    R third nerve palsy  . OSA (obstructive sleep apnea) 11/11/2006   CPAP  . Thyroid disease    Thyroid nodules s/p needle biopsy negative.      Past Surgical History:  Procedure Laterality Date  . CHOLECYSTECTOMY OPEN  1979    Social History  Substance Use Topics  . Smoking status: Former Smoker   Packs/day: 0.05    Years: 18.00    Types: Cigarettes    Quit date: 02/2016  . Smokeless tobacco: Never Used  . Alcohol use 0.0 oz/week     Comment: maybe 1-2 drinks per yr    Family History  Problem Relation Age of Onset  . Diabetes Mother   . Hypertension Mother   . Ovarian cancer Mother 62  . Breast cancer Mother 103  . Cancer Mother 45       ovarian cancer age 50; breast cancer age 58  . Diabetes Father   . Heart disease Father 19       CAD/CABG; AAA; CHF  . Hyperlipidemia Father   . Hypertension Father   . Prostate cancer Father        dx. 22-83  . Colon polyps Father        section of colon removed; unsure of #  . Heart disease Brother        Died of CHF at age 39.  Marland Kitchen Hyperlipidemia Brother   . Hypertension Brother   . Congestive Heart Failure Brother   . Heart disease Sister 73       3-vessel CAD s/p 1 stenting; CABG  . Diabetes Sister   . Hyperlipidemia Sister   .  Hypertension Sister   . Cancer Brother 59       GI- esophageal; smoker  . Heart disease Brother        MIs in 35s and CABG.  . Diabetes Brother   . Hypertension Brother   . Stroke Maternal Grandmother   . Hypertension Maternal Grandmother   . Skin cancer Maternal Grandfather        multiple - unknown types; dx. 38s  . Cancer Maternal Grandfather   . Heart Problems Paternal Grandfather   . Skin cancer Maternal Aunt        multiple - unknown type; dx. 64s  . Pancreatic cancer Maternal Uncle        dx. 70s    No Known Allergies  Medication list has been reviewed and updated.  Current Outpatient Prescriptions on File Prior to Visit  Medication Sig Dispense Refill  . Ascorbic Acid (VITAMIN C) 100 MG tablet Take 100 mg by mouth daily.    Marland Kitchen aspirin 325 MG EC tablet Take 325 mg by mouth daily.    Marland Kitchen b complex vitamins tablet Take 1 tablet by mouth daily.    Marland Kitchen co-enzyme Q-10 30 MG capsule Take 30 mg by mouth 3 (three) times daily.     . Dulaglutide (TRULICITY) 1.5 MV/7.8IO SOPN Inject 1.5 mg under  skin once a week 4 pen 5  . empagliflozin (JARDIANCE) 25 MG TABS tablet Take 25 mg by mouth daily. 30 tablet 5  . glucose blood (ONE TOUCH ULTRA TEST) test strip TESTING TWICE DAILY 100 each 3  . insulin aspart (NOVOLOG) 100 UNIT/ML injection Inject 20-25 Units into the skin 3 (three) times daily with meals. 4 vial 5  . Insulin Detemir (LEVEMIR) 100 UNIT/ML Pen Inject 60 Units into the skin 2 (two) times daily. 30 mL 5  . Insulin Pen Needle (BD PEN NEEDLE NANO U/F) 32G X 4 MM MISC USE AS DIRECTED; inject insulin five times daily 100 each 3  . Insulin Syringe-Needle U-100 (BD INSULIN SYRINGE ULTRAFINE) 31G X 15/64" 1 ML MISC USE TO GIVE 10-15 UNITS 3 (THREE) TIMES DAILY BEFORE MEALS. 300 each 2  . Insulin Syringe-Needle U-100 (BD INSULIN SYRINGE ULTRAFINE) 31G X 5/16" 0.5 ML MISC 1 Syringe by Does not apply route 3 (three) times daily. 100 each 11  . levocetirizine (XYZAL) 5 MG tablet Take 5 mg by mouth every evening.    Marland Kitchen losartan-hydrochlorothiazide (HYZAAR) 100-12.5 MG tablet Take 1 tablet by mouth daily. 90 tablet 1  . metFORMIN (GLUCOPHAGE) 1000 MG tablet TAKE 1 TABLET (1,000 MG TOTAL) BY MOUTH 2 (TWO) TIMES DAILY. 180 tablet 3  . Multiple Vitamins-Minerals (MULTIVITAMIN WITH MINERALS) tablet Take 1 tablet by mouth daily.    . rosuvastatin (CRESTOR) 10 MG tablet TAKE ONE TABLET BY MOUTH DAILY 90 tablet 3   No current facility-administered medications on file prior to visit.     ROS ROS otherwise unremarkable unless listed above.  Physical Examination: BP 131/83   Pulse 95   Temp 98.7 F (37.1 C) (Oral)   Resp 16   Ht 5\' 3"  (1.6 m)   Wt 210 lb 12.8 oz (95.6 kg)   SpO2 95%   BMI 37.34 kg/m  Ideal Body Weight: Weight in (lb) to have BMI = 25: 140.8  Physical Exam  Constitutional: She is oriented to person, place, and time. She appears well-developed and well-nourished. No distress.  HENT:  Head: Normocephalic and atraumatic.  Right Ear: Tympanic membrane, external ear and ear  canal  normal.  Left Ear: Tympanic membrane, external ear and ear canal normal.  Nose: Mucosal edema and rhinorrhea present. Right sinus exhibits no maxillary sinus tenderness and no frontal sinus tenderness. Left sinus exhibits no maxillary sinus tenderness and no frontal sinus tenderness.  Mouth/Throat: No uvula swelling. Oropharyngeal exudate, posterior oropharyngeal edema and posterior oropharyngeal erythema present.  Eyes: Pupils are equal, round, and reactive to light. Conjunctivae and EOM are normal.  Cardiovascular: Normal rate and regular rhythm.  Exam reveals no gallop, no distant heart sounds and no friction rub.   No murmur heard. Pulmonary/Chest: Effort normal. No respiratory distress. She has no decreased breath sounds. She has no wheezes. She has no rhonchi.  Lymphadenopathy:       Head (right side): No submandibular, no tonsillar, no preauricular and no posterior auricular adenopathy present.       Head (left side): No submandibular, no tonsillar, no preauricular and no posterior auricular adenopathy present.  Neurological: She is alert and oriented to person, place, and time.  Skin: She is not diaphoretic.  Psychiatric: She has a normal mood and affect. Her behavior is normal.     Assessment and Plan: Madison Cowan is a 56 y.o. female who is here today for cc of sore throat. Possible viral.  We will await strep and treat supportively.  Patient is in agreement of this plan.    Acute pharyngitis due to other specified organisms  Sore throat - Plan: Culture, Group A Strep, POCT rapid strep A  Ivar Drape, PA-C [facility] 7/13/201810:16 AM

## 2017-05-20 LAB — CULTURE, GROUP A STREP: Strep A Culture: NEGATIVE

## 2017-05-21 ENCOUNTER — Other Ambulatory Visit: Payer: Self-pay | Admitting: Internal Medicine

## 2017-05-30 ENCOUNTER — Encounter: Payer: Self-pay | Admitting: Physician Assistant

## 2017-06-20 ENCOUNTER — Ambulatory Visit: Payer: Self-pay | Admitting: Family Medicine

## 2017-07-29 ENCOUNTER — Ambulatory Visit (INDEPENDENT_AMBULATORY_CARE_PROVIDER_SITE_OTHER): Payer: BC Managed Care – PPO | Admitting: Internal Medicine

## 2017-07-29 ENCOUNTER — Encounter: Payer: Self-pay | Admitting: Internal Medicine

## 2017-07-29 VITALS — BP 130/84 | HR 95 | Wt 205.0 lb

## 2017-07-29 DIAGNOSIS — E1142 Type 2 diabetes mellitus with diabetic polyneuropathy: Secondary | ICD-10-CM | POA: Diagnosis not present

## 2017-07-29 DIAGNOSIS — E1165 Type 2 diabetes mellitus with hyperglycemia: Secondary | ICD-10-CM

## 2017-07-29 DIAGNOSIS — Z23 Encounter for immunization: Secondary | ICD-10-CM | POA: Diagnosis not present

## 2017-07-29 DIAGNOSIS — E042 Nontoxic multinodular goiter: Secondary | ICD-10-CM

## 2017-07-29 LAB — POCT GLYCOSYLATED HEMOGLOBIN (HGB A1C): Hemoglobin A1C: 6.2

## 2017-07-29 MED ORDER — INSULIN DETEMIR 100 UNIT/ML FLEXPEN: PEN_INJECTOR | SUBCUTANEOUS | 5 refills | Status: DC

## 2017-07-29 NOTE — Progress Notes (Signed)
Patient ID: Madison Cowan, female   DOB: 1961/08/24, 56 y.o.   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 Last visit 3 mo ago.  DM2: Last hemoglobin A1c was: Lab Results  Component Value Date   HGBA1C 6.4 04/23/2017   HGBA1C 9.3 12/12/2016   HGBA1C 11.3 09/10/2016   She is on: - Trulicity 1.5 mcg weekly  - Levemir 60 units 2x a day.  - Novolog: - 20 units for a smaller meal - 25 units for a larger meal - Metformin 1000 mg 2x a day with meals - Jardiance 25 mg daily >> started 01/2017  Pt checks her sugars 0-1x a day: - am: 146-199, 313 (forgot Levemir) >> 137-238 >> 114, 128-176, 211 >> 129-161 (last 2 weeks: 129-149) - 2h after b'fast: n/c >> 171 >> 150 - before lunch: 300s >> 228, 266 >> 203 >> 191 >> n/c - 2h after lunch: n/c >> 184-255 >> 283 (buffet) - before dinner: 300s >> 125-188 >> 125-182, 213 >> 113-135, 195 >> 133 - 2h after dinner: 326-371 >> 93, 115-272 >> 123-194, 216 >> 181 >> 85, 183, 206 - bedtime: n/c >> 150 >> n/c >> 72 - nighttime: n/c >> 133 No lows. Lowest sugar was 114 >> 85; she has hypoglycemia awareness at 70.  Highest sugar was  211 >> 283.  Glucometer: One Touch ultra  Pt's meals are: - Breakfast: protein shakes (6g carbs); McDonads  - Lunch: may skip; salads cheeseburger; school cafeteria food - Dinner: fast food; porc chops + collard greens + salad; hamburger; McNuggets - Snacks: sometimes; sweets, nuts, chips  - + mild CKD, last BUN/creatinine:  Lab Results  Component Value Date   BUN 17 03/18/2017   BUN 16 09/10/2016   CREATININE 1.07 (H) 03/18/2017   CREATININE 1.00 09/10/2016   - + HL. Last set of lipids: Lab Results  Component Value Date   CHOL 109 03/18/2017   HDL 34 (L) 03/18/2017   LDLCALC 53 03/18/2017   TRIG 112 03/18/2017   CHOLHDL 3.2  03/18/2017  On Crestor 10. - last eye exam was in 01/2017 >> No DR.. + glaucoma suspect. - she has numbness and tingling in her feet. + burning. On B complex.  She also has a history of HTN.  She also has thyroid nodules  - thyroid ultrasound (01/03/2016): Dominant nodule is a right mid lobe hypoechoic nodule of 2.1 x 0.8 x 1.2 cm, and previously measured up to 2.3 cm. There is one left thyroid nodule measuring 6 mm and several other tiny scattered nodules. - s/p benign biopsy of the dominant nodule  ROS: Constitutional: no weight gain/no weight loss, no fatigue, no subjective hyperthermia, no subjective hypothermia Eyes: no blurry vision, no xerophthalmia ENT: no sore throat, no nodules palpated in throat, no dysphagia, no odynophagia, no hoarseness Cardiovascular: no CP/no SOB/no palpitations/no leg swelling Respiratory: no cough/no SOB/no wheezing Gastrointestinal: no N/no V/no D/+ C/no acid reflux Musculoskeletal: no muscle aches/+ joint aches ("all") Skin: no rashes, no hair loss Neurological: no tremors/+ numbness/+ tingling/no dizziness  I reviewed pt's medications, allergies, PMH, social hx, family hx, and changes were documented in the history of present illness. Otherwise, unchanged from my initial visit note.   Past Medical History:  Diagnosis Date  . Allergy    Zyrtec daily.  . Arthritis  DDD lumbar spine  . Asthma    Albuterol seasonally.  . Diabetes mellitus without complication ([place])   . Glaucoma   . Heart murmur   . Hyperlipidemia   . Hypertension   . Neuromuscular disorder ([place])    R third nerve palsy  . OSA (obstructive sleep apnea) 11/11/2006   CPAP  . Thyroid disease    Thyroid nodules s/p needle biopsy negative.    Also, spondylolisthesis L4-L5  Past Surgical History:  Procedure Laterality Date  . CHOLECYSTECTOMY OPEN  1979   Social History   Social History  . Marital status: Divorced    Spouse name: N/A  . 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 Smoking status: Former Smoker    Packs/day: 0.05    Years: 18.00    Types: Cigarettes    Quit date: 02/2016  . Smokeless tobacco: Never Used  . Alcohol use 0.0 oz/week     Comment: maybe 1-2 drinks per yr  . Drug use: No  . Sexual activity: No   Other Topics Concern  . 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      Children:  None       Lives: alone, dog.       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  [place] work.      Tobacco: 1/2 pdd x 20 years.      Alcohol:  None      Drugs: none       Education: [employer].       Exercise: No.      Seatbelt: 100%      Guns: unloaded.      Sexually active: none; total partners = 5.  Last STD screen 2006.     Current Outpatient Prescriptions on File Prior to Visit  Medication Sig Dispense Refill  . Ascorbic Acid (VITAMIN C) 100 MG tablet Take 100 mg by mouth daily.    Marland Kitchen aspirin 325 MG EC tablet Take 325 mg by mouth daily.    Marland Kitchen b complex vitamins tablet Take 1 tablet by mouth daily.    Marland Kitchen co-enzyme Q-10 30 MG capsule Take 30 mg by mouth 3 (three) times daily.     . empagliflozin (JARDIANCE) 25 MG TABS tablet Take 25 mg by mouth daily. 30 tablet 5  . glucose blood (ONE TOUCH ULTRA TEST) test strip TESTING TWICE DAILY 100 each 3  . insulin aspart (NOVOLOG) 100 UNIT/ML injection Inject 20-25 Units into the skin 3 (three) times daily with meals. 4 vial 5  . Insulin Detemir (LEVEMIR) 100 UNIT/ML Pen Inject 60 Units into the skin 2 (two) times daily. 30 mL 5  . Insulin Pen Needle (BD PEN NEEDLE NANO U/F) 32G X 4 MM MISC USE AS DIRECTED; inject insulin five times daily 100 each 3  . Insulin Syringe-Needle U-100 (BD INSULIN SYRINGE ULTRAFINE) 31G X 15/64" 1 ML MISC USE TO GIVE 10-15 UNITS 3 (THREE) TIMES DAILY BEFORE MEALS. 300 each 2  .  Insulin Syringe-Needle U-100 (BD INSULIN SYRINGE ULTRAFINE) 31G X 5/16" 0.5 ML MISC 1 Syringe by Does not apply route 3 (three) times daily. 100 each 11  . levocetirizine (XYZAL) 5 MG tablet Take 5 mg by mouth every evening.    Marland Kitchen losartan-hydrochlorothiazide (HYZAAR) 100-12.5 MG tablet  Take 1 tablet by mouth daily. 90 tablet 1  . metFORMIN (GLUCOPHAGE) 1000 MG tablet TAKE 1 TABLET (1,000 MG TOTAL) BY MOUTH 2 (TWO) TIMES DAILY. 180 tablet 3  . Multiple Vitamins-Minerals (MULTIVITAMIN WITH MINERALS) tablet Take 1 tablet by mouth daily.    . rosuvastatin (CRESTOR) 10 MG tablet TAKE ONE TABLET BY MOUTH DAILY 90 tablet 3  . TRULICITY 1.5 ZO/1.0RU SOPN INJECT 1.5 MG UNDER THE SKIN ONCE WEEKLY 2 pen 4   No current facility-administered medications on file prior to visit.    No Known Allergies Family History  Problem Relation Age of Onset  . Diabetes Mother   . Hypertension Mother   . Ovarian cancer Mother 67  . Breast cancer Mother 76  . Cancer Mother 26       ovarian cancer age 66; breast cancer age 40  . Diabetes Father   . Heart disease Father 5       CAD/CABG; AAA; CHF  . Hyperlipidemia Father   . Hypertension Father   . Prostate cancer Father        dx. 62-83  . Colon polyps Father        section of colon removed; unsure of #  . Heart disease Brother        Died of CHF at age 68.  Marland Kitchen Hyperlipidemia Brother   . Hypertension Brother   . Congestive Heart Failure Brother   . Heart disease Sister 38       3-vessel CAD s/p 1 stenting; CABG  . Diabetes Sister   . Hyperlipidemia Sister   . Hypertension Sister   . Cancer Brother 62       GI- esophageal; smoker  . Heart disease Brother        MIs in 65s and CABG.  . Diabetes Brother   . Hypertension Brother   . Stroke Maternal Grandmother   . Hypertension Maternal Grandmother   . Skin cancer Maternal Grandfather        multiple - unknown types; dx. 56s  . Cancer Maternal Grandfather   . Heart Problems Paternal Grandfather   .  Skin cancer Maternal Aunt        multiple - unknown type; dx. 79s  . Pancreatic cancer Maternal Uncle        dx. 64s   PE: BP 130/84 (BP Location: Left Arm, Patient Position: Sitting)   Pulse 95   Wt 205 lb (93 kg)   SpO2 96%   BMI 36.31 kg/m  Wt Readings from Last 3 Encounters:  07/29/17 205 lb (93 kg)  05/17/17 210 lb 12.8 oz (95.6 kg)  04/23/17 207 lb (93.9 kg)   Constitutional: overweight, in NAD Eyes: PERRLA, EOMI, no exophthalmos ENT: moist mucous membranes, no thyromegaly, no cervical lymphadenopathy Cardiovascular: tachycardia, RR, No MRG Respiratory: CTA B Gastrointestinal: abdomen soft, NT, ND, BS+ Musculoskeletal: no deformities, strength intact in all 4 Skin: moist, warm, no rashes Neurological: no tremor with outstretched hands, DTR normal in all 4  ASSESSMENT: 1. DM2, insulin-dependent, now better controlled, with complications - Peripheral neuropathy.  2. PN  - 2/2 DM2  3. MNG  PLAN:  1. Patient with long-standing, uncontrolled diabetes, recently with much better control, on a complex regimen including basal-bolus insulin, metformin, Trulicity, Jardiance. HbA1c at last visit was 6.4% and today, HbA1c is 6.2% (better). - Her sugars are close to goal, but still above the upper limit of normal especially in the morning, therefore, will keep the same dose of Levemir  at night, but will try to reduce the Levemir in a.m. - She is not checking her sugars later in the day frequently, and I advised her to start doing so. - I suggested to:  Patient Instructions  Please continue: - Trulicity 1.5 mcg weekly - Novolog: - 20 units for a smaller meal - 25 units for a larger meal - Metformin 1000 mg 2x a day with meals. - Jardiance 25 mg daily before b'fast.  Decrease: - Levemir to 40 units in am and continue 60 units at bedtime  Start: - Alpha Lipoic Acid 600 mg 2x a day.  Please return in 3 months with your sugar log.  - continue checking sugars at different  times of the day - check 2x a day, rotating checks - advised for yearly eye exams >> she is UTD - will give her the flu shot today - Return to clinic in 3 mo with sugar log   2. PN - She is on B complex and I recommended to start alpha lipoic acid (please see above)   3. MNG - reviewed her most recent thyroid U/S >> R dominant nodule has slightly decreased in size - reviewed prev. Bx of this nodule >> benign - we discussed to let me know if she develops neck compression sxs - will plan to repeat the U/S next year.  Philemon Kingdom, MD PhD [facility] Endocrinology

## 2017-07-29 NOTE — Patient Instructions (Addendum)
Please continue: - Trulicity 1.5 mcg weekly - Novolog: - 20 units for a smaller meal - 25 units for a larger meal - Metformin 1000 mg 2x a day with meals. - Jardiance 25 mg daily before b'fast.  Decrease: - Levemir to 40 units in am and continue 60 units at bedtime  Start: - Alpha Lipoic Acid 600 mg 2x a day.  Please return in 3 months with your sugar log.

## 2017-08-18 ENCOUNTER — Other Ambulatory Visit: Payer: Self-pay | Admitting: Internal Medicine

## 2017-09-16 ENCOUNTER — Other Ambulatory Visit: Payer: Self-pay | Admitting: Family Medicine

## 2017-09-17 ENCOUNTER — Other Ambulatory Visit: Payer: Self-pay

## 2017-09-17 MED ORDER — INSULIN PEN NEEDLE 32G X 4 MM MISC: 3 refills | Status: DC

## 2017-09-23 ENCOUNTER — Ambulatory Visit: Payer: BC Managed Care – PPO | Admitting: Family Medicine

## 2017-09-30 ENCOUNTER — Ambulatory Visit: Payer: BC Managed Care – PPO | Admitting: Family Medicine

## 2017-10-07 ENCOUNTER — Other Ambulatory Visit: Payer: Self-pay | Admitting: Family Medicine

## 2017-10-14 ENCOUNTER — Other Ambulatory Visit: Payer: Self-pay | Admitting: Internal Medicine

## 2017-10-15 ENCOUNTER — Encounter: Payer: Self-pay | Admitting: Family Medicine

## 2017-10-15 ENCOUNTER — Ambulatory Visit: Payer: BC Managed Care – PPO | Admitting: Family Medicine

## 2017-10-15 ENCOUNTER — Other Ambulatory Visit: Payer: Self-pay

## 2017-10-15 VITALS — BP 138/72 | HR 101 | Temp 98.5°F | Resp 16 | Ht 63.78 in | Wt 205.0 lb

## 2017-10-15 DIAGNOSIS — R221 Localized swelling, mass and lump, neck: Secondary | ICD-10-CM

## 2017-10-15 DIAGNOSIS — R590 Localized enlarged lymph nodes: Secondary | ICD-10-CM

## 2017-10-15 DIAGNOSIS — E7800 Pure hypercholesterolemia, unspecified: Secondary | ICD-10-CM

## 2017-10-15 DIAGNOSIS — I10 Essential (primary) hypertension: Secondary | ICD-10-CM | POA: Diagnosis not present

## 2017-10-15 DIAGNOSIS — E1142 Type 2 diabetes mellitus with diabetic polyneuropathy: Secondary | ICD-10-CM | POA: Diagnosis not present

## 2017-10-15 DIAGNOSIS — R2681 Unsteadiness on feet: Secondary | ICD-10-CM

## 2017-10-15 LAB — POCT URINALYSIS DIP (MANUAL ENTRY)
BILIRUBIN UA: NEGATIVE
BILIRUBIN UA: NEGATIVE mg/dL
Blood, UA: NEGATIVE
LEUKOCYTES UA: NEGATIVE
Nitrite, UA: NEGATIVE
Protein Ur, POC: NEGATIVE mg/dL
SPEC GRAV UA: 1.01 (ref 1.010–1.025)
UROBILINOGEN UA: 0.2 U/dL
pH, UA: 5 (ref 5.0–8.0)

## 2017-10-15 MED ORDER — PREGABALIN 25 MG PO CAPS: 25.0000 mg | ORAL_CAPSULE | Freq: Every day | ORAL | 5 refills | Status: DC

## 2017-10-15 MED ORDER — PREGABALIN 25 MG PO CAPS: 2550.0000 mg | ORAL_CAPSULE | Freq: Every day | ORAL | 5 refills | Status: DC

## 2017-10-15 NOTE — Progress Notes (Signed)
Subjective:    Patient ID: Madison Cowan, female    DOB: 07-22-1961, 56 y.o.   MRN: [id number]  10/15/2017  Diabetes (3 month follow-up ); Hyperlipidemia; and Hypertension    HPI This 56 y.o. female presents for evaluation of hypertension, hypercholesterolemia., allergic rhinitis.   Allergies have been horrible.  Xyzal is working well. Mucinex sinus max.  Mild nasal congestion.    Upcoming appointment with Renne Crigler in December 2018.  Does not check at home.  Thyroid nodules: lymph node present on thyroid US.   Gets choked on fluids intermittently.  Rare occurrence.    Not exercising.  During summer, exercising well.  Work is really busy.  Feet are really numb.  If sits a lot, bothers more.   Has a lot of joint pain; R shoulder pain.  Slept in relciner half the night.  During the day, suffers with numbness and burning yet not aching.    Unsteady gait: requesting handicap placard at last visit.  When goes to [facility] must use cart.  If at school, must hold onto a table.  Waxes and wanes.  Seems separate issue from feet.   No frequent falls.  When has fallen, was not due to balance issues.  Last fall was one year ago.  One year ago, slipped over a rock.  Put right foot down and landed on rock with left foot.  Scraped knee.  No falls to one side.  Knees will buckle at times.  No knee pain.  No dizziness with unsteady gait.  Did have vertigo when turning over in bed.     BP Readings from Last 3 Encounters:  10/15/17 138/72  07/29/17 130/84  05/17/17 131/83   Wt Readings from Last 3 Encounters:  10/15/17 205 lb (93 kg)  07/29/17 205 lb (93 kg)  05/17/17 210 lb 12.8 oz (95.6 kg)   Immunization History  Administered Date(s) Administered  . Hepatitis B, adult 01/18/2015, 02/23/2015, 09/08/2015  . Influenza Whole 11/25/2011  . Influenza,inj,Quad PF,6+ Mos 09/01/2013, 10/05/2014, 09/08/2015, 09/10/2016, 07/29/2017  . Pneumococcal Polysaccharide-23 11/11/2006, 09/08/2015  . Tdap  11/25/2006, 03/18/2017    Review of Systems  Constitutional: Negative for chills, diaphoresis, fatigue and fever.  HENT: Positive for congestion, postnasal drip and rhinorrhea. Negative for ear pain, sore throat, tinnitus and trouble swallowing.   Eyes: Negative for visual disturbance.  Respiratory: Negative for cough and shortness of breath.   Cardiovascular: Negative for chest pain, palpitations and leg swelling.  Gastrointestinal: Negative for abdominal pain, constipation, diarrhea, nausea and vomiting.  Endocrine: Negative for cold intolerance, heat intolerance, polydipsia, polyphagia and polyuria.  Neurological: Positive for numbness. Negative for dizziness, tremors, seizures, syncope, facial asymmetry, speech difficulty, weakness, light-headedness and headaches.    Past Medical History:  Diagnosis Date  . Allergy    Zyrtec daily.  . Arthritis    DDD lumbar spine  . Asthma    Albuterol seasonally.  . Diabetes mellitus without complication ([place])   . Glaucoma   . Heart murmur   . Hyperlipidemia   . Hypertension   . Neuromuscular disorder ([place])    R third nerve palsy  . OSA (obstructive sleep apnea) 11/11/2006   CPAP  . Thyroid disease    Thyroid nodules s/p needle biopsy negative.     Past Surgical History:  Procedure Laterality Date  . CHOLECYSTECTOMY OPEN  1979   No Known Allergies Current Outpatient Medications on File Prior to Visit  Medication Sig Dispense Refill  . Ascorbic Acid (VITAMIN C)  100 MG tablet Take 100 mg by mouth daily.    Marland Kitchen aspirin 325 MG EC tablet Take 325 mg by mouth daily.    Marland Kitchen b complex vitamins tablet Take 1 tablet by mouth daily.    Marland Kitchen co-enzyme Q-10 30 MG capsule Take 30 mg by mouth 3 (three) times daily.     Marland Kitchen glucose blood (ONE TOUCH ULTRA TEST) test strip TESTING TWICE DAILY 100 each 3  . insulin aspart (NOVOLOG) 100 UNIT/ML injection Inject 20-25 Units into the skin 3 (three) times daily with meals. 4 vial 5  . Insulin Detemir (LEVEMIR) 100  UNIT/ML Pen Inject under skin 100 units a day as advised 30 mL 5  . Insulin Pen Needle (BD PEN NEEDLE NANO U/F) 32G X 4 MM MISC USE AS DIRECTED; inject insulin five times daily 100 each 3  . Insulin Syringe-Needle U-100 (B-D INS SYR ULTRAFINE 1CC/31G) 31G X 5/16" 1 ML MISC USE TO GIVE 10 TO 15 UNITS 3 TIMES DAILY BEFORE MEALS 100 each 1  . JARDIANCE 25 MG TABS tablet TAKE ONE TABLET BY MOUTH DAILY 30 tablet 4  . levocetirizine (XYZAL) 5 MG tablet Take 5 mg by mouth every evening.    Marland Kitchen losartan-hydrochlorothiazide (HYZAAR) 100-12.5 MG tablet Take 1 tablet by mouth daily. 90 tablet 1  . metFORMIN (GLUCOPHAGE) 1000 MG tablet TAKE 1 TABLET (1,000 MG TOTAL) BY MOUTH 2 (TWO) TIMES DAILY. 180 tablet 3  . Multiple Vitamins-Minerals (MULTIVITAMIN WITH MINERALS) tablet Take 1 tablet by mouth daily.    . rosuvastatin (CRESTOR) 10 MG tablet TAKE ONE TABLET BY MOUTH DAILY 90 tablet 3  . TRULICITY 1.5 MW/1.0UV SOPN INJECT 1.5 MG UNDER THE SKIN ONCE WEEKLY AS DIRECTED. 2 pen 0   No current facility-administered medications on file prior to visit.    Social History   Socioeconomic History  . Marital status: Divorced    Spouse name: Not on file  . Number of children: 0  . Years of education: Not on file  . Highest education level: Not on file  Social Needs  . Financial resource strain: Not on file  . Food insecurity - worry: Not on file  . Food insecurity - inability: Not on file  . Transportation needs - medical: Not on file  . Transportation needs - non-medical: Not on file  Occupational History  . 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 Smoking status: Former Smoker    Packs/day: 0.05    Years: 18.00    Pack years: 0.90    Types: Cigarettes    Last attempt to quit: 02/2016    Years since quitting: 1.6  . Smokeless tobacco: Never Used  Substance and Sexual Activity  . Alcohol use: Yes    Alcohol/week: 0.0 oz    Comment: maybe 1-2 drinks per yr  . Drug use: No  . Sexual  activity: No  Other Topics Concern  . Not on file  Social History Narrative   Marital status: Divorced after 25 years of marriage; not dating in 2018; not interested.      Children:  None       Lives: alone, 1 dog.       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  [place] work.  Works 50 hours per week.  No longer working on weekends in 2018.      Tobacco:   QUIT 03/01/2016!!!!!!  Smoked for 20 years      Alcohol:  None      Drugs: none       Education: [employer].       Exercise: No.      Seatbelt: 100%; no texting      Guns: unloaded.      Sexually active: none; total partners = 5.  Last STD screen 2006.     Family History  Problem Relation Age of Onset  . Diabetes Mother   . Hypertension Mother   . Ovarian cancer Mother 24  . Breast cancer Mother 73  . Cancer Mother 61       ovarian cancer age 24; breast cancer age 78  . Diabetes Father   . Heart disease Father 58       CAD/CABG; AAA; CHF  . Hyperlipidemia Father   . Hypertension Father   . Prostate cancer Father        dx. 8-83  . Colon polyps Father        section of colon removed; unsure of #  . Heart disease Brother        Died of CHF at age 94.  Marland Kitchen Hyperlipidemia Brother   . Hypertension Brother   . Congestive Heart Failure Brother   . Heart disease Sister 80       3-vessel CAD s/p 1 stenting; CABG  . Diabetes Sister   . Hyperlipidemia Sister   . Hypertension Sister   . Cancer Brother 38       GI- esophageal; smoker  . Heart disease Brother        MIs in 65s and CABG.  . Diabetes Brother   . Hypertension Brother   . Stroke Maternal Grandmother   . Hypertension Maternal Grandmother   . Skin cancer Maternal Grandfather        multiple - unknown types; dx. 43s  . Cancer Maternal Grandfather   . Heart Problems Paternal Grandfather   . Skin cancer Maternal Aunt        multiple - unknown type; dx. 56s  . Pancreatic cancer Maternal Uncle        dx. 70s         Objective:    BP 138/72   Pulse (!) 101   Temp 98.5 F (36.9 C) (Oral)   Resp 16   Ht 5' 3.78" (1.62 m)   Wt 205 lb (93 kg)   SpO2 96%   BMI 35.43 kg/m  Physical Exam  Constitutional: She is oriented to person, place, and time. She appears well-developed and well-nourished. No distress.  HENT:  Head: Normocephalic and atraumatic.  Right Ear: External ear normal.  Left Ear: External ear normal.  Nose: Nose normal.  Mouth/Throat: Oropharynx is clear and moist.  Eyes: Conjunctivae and EOM are normal. Pupils are equal, round, and reactive to light.  Neck: Normal range of motion. Neck supple. Carotid bruit is not present. No thyromegaly present.  Cardiovascular: Normal rate, regular rhythm, normal heart sounds and intact distal pulses. Exam reveals no gallop and no friction rub.  No murmur heard. Pulmonary/Chest: Effort normal and breath sounds normal. She has no wheezes. She has no rales.  Abdominal: Soft. Bowel sounds are normal. She exhibits no distension and no mass. There is no tenderness. There is no rebound and no guarding.  Lymphadenopathy:    She has no cervical adenopathy.  Neurological: She is alert and oriented to person, place, and time. No cranial nerve deficit. She exhibits normal muscle tone. She displays a negative Romberg sign. Coordination and  gait normal.  Skin: Skin is warm and dry. No rash noted. She is not diaphoretic. No erythema. No pallor.  Psychiatric: She has a normal mood and affect. Her behavior is normal. Judgment and thought content normal.   No results found. Depression screen [facility] 2/9 10/15/2017 05/17/2017 03/18/2017 12/06/2016 09/10/2016  Decreased Interest 0 0 0 0 0  Down, Depressed, Hopeless 0 0 0 0 0  PHQ - 2 Score 0 0 0 0 0  Altered sleeping - - - - -  Tired, decreased energy - - - - -  Change in appetite - - - - -  Feeling bad or failure about yourself  - - - - -  Trouble concentrating - - - - -  Moving slowly or fidgety/restless - - - - -   Suicidal thoughts - - - - -  PHQ-9 Score - - - - -   Fall Risk  10/15/2017 05/17/2017 03/18/2017 12/06/2016 09/10/2016  Falls in the past year? Yes Yes No No No  Number falls in past yr: 1 1 - - -  Injury with Fall? - No - - -  Follow up - Falls evaluation completed - - -        Assessment & Plan:   1. HYPERTENSION, BENIGN   2. Type 2 diabetes, controlled, with peripheral neuropathy ([place])   3. Peripheral sensory neuropathy due to type 2 diabetes mellitus ([place])   4. Pure hypercholesterolemia   5. Unsteady gait   6. LAD (lymphadenopathy), anterior cervical   7. Localized swelling, mass or lump of neck     New onset elongated lymph node appreciated with thyroid ultrasound recently.  Refer for CT of neck to evaluate lymph node further.  Hypertension and hypercholesterolemia well controlled.  Obtain labs.  Continue current medications.  Diabetes well controlled.  Followed by endocrinology.  Peripheral neuropathy interfering with sleep at times.  Patient intolerant to gabapentin in the past.  Discussed treatment options with patient during visit.  Patient requested trial of Lyrica 1-2 tablets at bedtime as needed.  If patient cannot tolerate Lyrica, consider trial of Cymbalta or amitriptyline.  New onset unsteady gait.  Etiology unclear and does not seem related to peripheral neuropathy.  Patient has requested handicap sticker due to unsteady gait however recommend neurology consultation to rule out pathology.  Orders Placed This Encounter  Procedures  . CT SOFT TISSUE NECK WO CONTRAST    Standing Status:   Future    Standing Expiration Date:   01/14/2019    Order Specific Question:   Is patient pregnant?    Answer:   No    Order Specific Question:   Preferred imaging location?    Answer:   [facility]    Order Specific Question:   Radiology Contrast Protocol - do NOT remove file path    Answer:   [email or web address]  . CBC with  Differential/Platelet  . Comprehensive metabolic panel    Order Specific Question:   Has the patient fasted?    Answer:   No  . Lipid panel    Order Specific Question:   Has the patient fasted?    Answer:   No  . Ambulatory referral to Neurology    Referral Priority:   Routine    Referral Type:   Consultation    Referral Reason:   Specialty Services Required    Requested Specialty:   Neurology    Number of Visits Requested:   1  . POCT urinalysis dipstick  Meds ordered this encounter  Medications  . DISCONTD: pregabalin (LYRICA) 25 MG capsule    Sig: Take 102 capsules (2,550 mg total) by mouth at bedtime.    Dispense:  60 capsule    Refill:  5  . pregabalin (LYRICA) 25 MG capsule    Sig: Take 1-2 capsules (25-50 mg total) by mouth at bedtime.    Dispense:  60 capsule    Refill:  5    Return in about 6 months (around 04/15/2018) for complete physical examiniation.   Brigida Scotti Elayne Guerin, M.D. Primary [facility] at [facility] [address] [place], [place]  [address] [phone] phone [phone] fax

## 2017-10-15 NOTE — Patient Instructions (Addendum)
IF you received an x-ray today, you will receive an invoice from [facility]. Please contact [facility] at [phone] with questions or concerns regarding your invoice.   IF you received labwork today, you will receive an invoice from [facility]. Please contact [facility] at [phone] with questions or concerns regarding your invoice.   Our billing staff will not be able to assist you with questions regarding bills from these companies.  You will be contacted with the lab results as soon as they are available. The fastest way to get your results is to activate your My Chart account. Instructions are located on the last page of this paperwork. If you have not heard from [place] regarding the results in 2 weeks, please contact this office.      Diabetic Neuropathy Diabetic neuropathy is a nerve disease or nerve damage that is caused by diabetes mellitus. About half of all people with diabetes mellitus have some form of nerve damage. Nerve damage is more common in those who have had diabetes mellitus for many years and who generally have not had good control of their blood sugar (glucose) level. Diabetic neuropathy is a common complication of diabetes mellitus. There are three common types of diabetic neuropathy and a fourth type that is less common and less understood:  Peripheral neuropathy-This is the most common type of diabetic neuropathy. It causes damage to the nerves of the feet and legs first and then eventually the hands and arms. The damage affects the ability to sense touch.  Autonomic neuropathy-This type causes damage to the autonomic nervous system, which controls the following functions: ? Heartbeat. ? Body temperature. ? Blood pressure. ? Urination. ? Digestion. ? Sweating. ? Sexual function.  Focal neuropathy-Focal neuropathy can be painful and unpredictable and occurs most often in older adults with diabetes mellitus. It involves a specific nerve or one  area and often comes on suddenly. It usually does not cause long-term problems.  Radiculoplexus neuropathy- Sometimes called lumbosacral radiculoplexus neuropathy, radiculoplexus neuropathy affects the nerves of the thighs, hips, buttocks, or legs. It is more common in people with type 2 diabetes mellitus and in older men. It is characterized by debilitating pain, weakness, and atrophy, usually in the thigh muscles.  What are the causes? The cause of peripheral, autonomic, and focal neuropathies is diabetes mellitus that is uncontrolled and high glucose levels. The cause of radiculoplexus neuropathy is unknown. However, it is thought to be caused by inflammation related to uncontrolled glucose levels. What are the signs or symptoms? Peripheral Neuropathy Peripheral neuropathy develops slowly over time. When the nerves of the feet and legs no longer work there may be:  Burning, stabbing, or aching pain in the legs or feet.  Inability to feel pressure or pain in your feet. This can lead to: ? Thick calluses over pressure areas. ? Pressure sores. ? Ulcers.  Foot deformities.  Reduced ability to feel temperature changes.  Muscle weakness.  Autonomic Neuropathy The symptoms of autonomic neuropathy vary depending on which nerves are affected. Symptoms may include:  Problems with digestion, such as: ? Feeling sick to your stomach (nausea). ? Vomiting. ? Bloating. ? Constipation. ? Diarrhea. ? Abdominal pain.  Difficulty with urination. This occurs if you lose your ability to sense when your bladder is full. Problems include: ? Urine leakage (incontinence). ? Inability to empty your bladder completely (retention).  Rapid or irregular heartbeat (palpitations).  Blood pressure drops when you stand up (orthostatic hypotension). When you stand up you  may feel: ? Dizzy. ? Weak. ? Faint.  In men, inability to attain and maintain an erection.  In women, vaginal dryness and problems  with decreased sexual desire and arousal.  Problems with body temperature regulation.  Increased or decreased sweating.  Focal Neuropathy  Abnormal eye movements or abnormal alignment of both eyes.  Weakness in the wrist.  Foot drop. This results in an inability to lift the foot properly and abnormal walking or foot movement.  Paralysis on one side of your face (Bell palsy).  Chest or abdominal pain. Radiculoplexus Neuropathy  Sudden, severe pain in your hip, thigh, or buttocks.  Weakness and wasting of thigh muscles.  Difficulty rising from a seated position.  Abdominal swelling.  Unexplained weight loss (usually more than 10 lb [4.5 kg]). How is this diagnosed? Peripheral Neuropathy Your senses may be tested. Sensory function testing can be done with:  A light touch using a monofilament.  A vibration with tuning fork.  A sharp sensation with a pin prick.  Other tests that can help diagnose neuropathy are:  Nerve conduction velocity. This test checks the transmission of an electrical current through a nerve.  Electromyography. This shows how muscles respond to electrical signals transmitted by nearby nerves.  Quantitative sensory testing. This is used to assess how your nerves respond to vibrations and changes in temperature.  Autonomic Neuropathy Diagnosis is often based on reported symptoms. Tell your health care provider if you experience:  Dizziness.  Constipation.  Diarrhea.  Inappropriate urination or inability to urinate.  Inability to get or maintain an erection.  Tests that may be done include:  Electrocardiography or Holter monitor. These are tests that can help show problems with the heart rate or heart rhythm.  An X-ray exam may be done.  Focal Neuropathy Diagnosis is made based on your symptoms and what your health care provider finds during your exam. Other tests may be done. They may include:  Nerve conduction velocities. This checks  the transmission of electrical current through a nerve.  Electromyography. This shows how muscles respond to electrical signals transmitted by nearby nerves.  Quantitative sensory testing. This test is used to assess how your nerves respond to vibration and changes in temperature.  Radiculoplexus Neuropathy  Often the first thing is to eliminate any other issue or problems that might be the cause, as there is no standard test for diagnosis.  X-ray exam of your spine and lumbar region.  Spinal tap to rule out cancer.  MRI to rule out other lesions. How is this treated? Once nerve damage occurs, it cannot be reversed. The goal of treatment is to keep the disease or nerve damage from getting worse and affecting more nerve fibers. Controlling your blood glucose level is the key. Most people with radiculoplexus neuropathy see at least a partial improvement over time. You will need to keep your blood glucose and HbA1c levels in the target range determined by your health care provider. Things that help control blood glucose levels include:  Blood glucose monitoring.  Meal planning.  Physical activity.  Diabetes medicine.  Over time, maintaining lower blood glucose levels helps lessen symptoms. Sometimes, prescription pain medicine is needed. Follow these instructions at home:  Do not smoke.  Keep your blood glucose level in the range that you and your health care provider have determined acceptable for you.  Keep your blood pressure level in the range that you and your health care provider have determined acceptable for you.  Eat a  well-balanced diet.  Be physically active every day. Include strength training and balance exercises.  Protect your feet. ? Check your feet every day for sores, cuts, blisters, or signs of infection. ? Wear padded socks and supportive shoes. Use orthotic inserts, if necessary. ? Regularly check the insides of your shoes for worn spots. Make sure there  are no rocks or other items inside your shoes before you put them on. Contact a health care provider if:  You have burning, stabbing, or aching pain in the legs or feet.  You are unable to feel pressure or pain in your feet.  You develop problems with digestion such as: ? Nausea. ? Vomiting. ? Bloating. ? Constipation. ? Diarrhea. ? Abdominal pain.  You have difficulty with urination, such as: ? Incontinence. ? Retention.  You have palpitations.  You develop orthostatic hypotension. When you stand up you may feel: ? Dizzy. ? Weak. ? Faint.  You cannot attain and maintain an erection (in men).  You have vaginal dryness and problems with decreased sexual desire and arousal (in women).  You have severe pain in your thighs, legs, or buttocks.  You have unexplained weight loss. This information is not intended to replace advice given to you by your health care provider. Make sure you discuss any questions you have with your health care provider. Document Released: 01/06/2002 Document Revised: 04/04/2016 Document Reviewed: 04/08/2013 Elsevier Interactive Patient Education  2017 [employer].

## 2017-10-16 LAB — CBC WITH DIFFERENTIAL/PLATELET
BASOS ABS: 0 10*3/uL (ref 0.0–0.2)
Basos: 0 %
EOS (ABSOLUTE): 0.2 10*3/uL (ref 0.0–0.4)
Eos: 2 %
HEMOGLOBIN: 14 g/dL (ref 11.1–15.9)
Hematocrit: 42.2 % (ref 34.0–46.6)
Immature Grans (Abs): 0 10*3/uL (ref 0.0–0.1)
Immature Granulocytes: 0 %
LYMPHS ABS: 3.6 10*3/uL — AB (ref 0.7–3.1)
Lymphs: 33 %
MCH: 28.6 pg (ref 26.6–33.0)
MCHC: 33.2 g/dL (ref 31.5–35.7)
MCV: 86 fL (ref 79–97)
MONOCYTES: 5 %
Monocytes Absolute: 0.6 10*3/uL (ref 0.1–0.9)
NEUTROS ABS: 6.4 10*3/uL (ref 1.4–7.0)
Neutrophils: 60 %
Platelets: 250 10*3/uL (ref 150–379)
RBC: 4.89 x10E6/uL (ref 3.77–5.28)
RDW: 14.9 % (ref 12.3–15.4)
WBC: 10.8 10*3/uL (ref 3.4–10.8)

## 2017-10-16 LAB — LIPID PANEL
CHOLESTEROL TOTAL: 105 mg/dL (ref 100–199)
Chol/HDL Ratio: 3.3 ratio (ref 0.0–4.4)
HDL: 32 mg/dL — ABNORMAL LOW (ref >39)
LDL Calculated: 46 mg/dL (ref 0–99)
Triglycerides: 137 mg/dL (ref 0–149)
VLDL Cholesterol Cal: 27 mg/dL (ref 5–40)

## 2017-10-16 LAB — COMPREHENSIVE METABOLIC PANEL
ALBUMIN: 4.5 g/dL (ref 3.5–5.5)
ALK PHOS: 56 IU/L (ref 39–117)
ALT: 24 IU/L (ref 0–32)
AST: 27 IU/L (ref 0–40)
Albumin/Globulin Ratio: 1.7 (ref 1.2–2.2)
BILIRUBIN TOTAL: 0.5 mg/dL (ref 0.0–1.2)
BUN / CREAT RATIO: 22 (ref 9–23)
BUN: 20 mg/dL (ref 6–24)
CHLORIDE: 100 mmol/L (ref 96–106)
CO2: 26 mmol/L (ref 20–29)
Calcium: 9.7 mg/dL (ref 8.7–10.2)
Creatinine, Ser: 0.92 mg/dL (ref 0.57–1.00)
GFR calc Af Amer: 79 mL/min/{1.73_m2} (ref >59)
GFR calc non Af Amer: 69 mL/min/{1.73_m2} (ref >59)
GLOBULIN, TOTAL: 2.6 g/dL (ref 1.5–4.5)
GLUCOSE: 138 mg/dL — AB (ref 65–99)
POTASSIUM: 4.5 mmol/L (ref 3.5–5.2)
SODIUM: 144 mmol/L (ref 134–144)
Total Protein: 7.1 g/dL (ref 6.0–8.5)

## 2017-10-23 ENCOUNTER — Other Ambulatory Visit: Payer: Self-pay | Admitting: Family Medicine

## 2017-10-28 ENCOUNTER — Telehealth: Payer: Self-pay | Admitting: Family Medicine

## 2017-10-28 ENCOUNTER — Encounter: Payer: Self-pay | Admitting: Internal Medicine

## 2017-10-28 ENCOUNTER — Ambulatory Visit: Payer: BC Managed Care – PPO | Admitting: Internal Medicine

## 2017-10-28 VITALS — BP 140/80 | HR 92 | Ht 63.25 in | Wt 208.6 lb

## 2017-10-28 DIAGNOSIS — E042 Nontoxic multinodular goiter: Secondary | ICD-10-CM

## 2017-10-28 DIAGNOSIS — E1142 Type 2 diabetes mellitus with diabetic polyneuropathy: Secondary | ICD-10-CM | POA: Diagnosis not present

## 2017-10-28 DIAGNOSIS — R221 Localized swelling, mass and lump, neck: Secondary | ICD-10-CM

## 2017-10-28 LAB — POCT GLYCOSYLATED HEMOGLOBIN (HGB A1C): Hemoglobin A1C: 6.8

## 2017-10-28 NOTE — Patient Instructions (Signed)
Please continue: - Metformin 1000 mg 2x a day with meals. - Jardiance 25 mg daily before b'fast - Trulicity 1.5 mcg weekly - Levemir 40 units in a.m. and 60 units at bedtime (pens)  Please increase: - Novolog (vials): - 20 units for a smaller meal - 25 units for a larger meal - 28-30 units for a very large meal   Please return in 3 months with your sugar log.

## 2017-10-28 NOTE — Telephone Encounter (Signed)
I ordered CT neck with contrast. Pt just had labs 10/15/17.  Does she need additional labs?

## 2017-10-28 NOTE — Progress Notes (Signed)
Patient ID: Madison Cowan, female   DOB: Sep 10, 1961, 56 y.o.   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 Last visit 3 months ago.  DM2: Last hemoglobin A1c was: Lab Results  Component Value Date   HGBA1C 6.2 07/29/2017   HGBA1C 6.4 04/23/2017   HGBA1C 9.3 12/12/2016   She is on: - Metformin 1000 mg 2x a day with meals. - Jardiance 25 mg daily before b'fast. - started 39/7673 - Trulicity 1.5 mcg weekly - Levemir 40 units in a.m. and 60 units at bedtime (pens) - Novolog (vials): - 20 units for a smaller meal - 25 units for a larger meal  Pt checks her sugars 2-4x a day - per review of log - am: 137-238 >> 114, 128-176, 211 >> 129-161 >> 121-172, 179 - 2h after b'fast: n/c >> 171 >> 150 >> 177, 208 - before lunch: 228, 266 >> 203 >> 191 >> n/c >> 186, 190 - 2h after lunch: n/c >> 184-255 >> 283 (buffet) >> 72, 161, 195 - before dinner: 125-182, 213 >> 113-135, 195 >> 133 >> 89-161, 195 - 2h after dinner: 123-194, 216 >> 181 >> 85, 183, 206 >> 113-183, 207, 283 - bedtime: n/c >> 150 >> n/c >> 72 >> 150, 161 - nighttime: n/c >> 133 >> n/c Lowest sugar was 114 >> 85 >> 72; she has hypoglycemia awareness at 70.  Highest sugar was  211 >> 283 >> 283.  Glucometer: One Touch ultra  Pt's meals are: - Breakfast: protein shakes (6g carbs); McDonads  - Lunch: may skip; salads cheeseburger; school cafeteria food - Dinner: fast food; porc chops + collard greens + salad; hamburger; McNuggets - Snacks: sometimes; sweets, nuts, chips She does not have organized mealtimes.  - + Mild CKD CKD, last BUN/creatinine:  Lab Results  Component Value Date   BUN 20 10/15/2017   BUN 17 03/18/2017   CREATININE 0.92 10/15/2017   CREATININE 1.07 (H) 03/18/2017   - + HL. Last set of lipids: Lab Results  Component Value Date    CHOL 105 10/15/2017   HDL 32 (L) 10/15/2017   LDLCALC 46 10/15/2017   TRIG 137 10/15/2017   CHOLHDL 3.3 10/15/2017  On Crestor 10. - last eye exam was in 01/2017: No DR. + glaucoma suspect. -+ Numbness and tingling in her feet, and also burning.  She is on B complex and I suggested alpha lipoic acid at last visit >> started this. She started Lyrica.   She also has a history of HTN.  Thyroid nodules: Thyroid ultrasound (01/03/2016): Dominant nodule is a right mid lobe hypoechoic nodule of 2.1 x 0.8 x 1.2 cm, and previously measured up to 2.3 cm. There is one left thyroid nodule measuring 6 mm and several other tiny scattered nodules. She had a benign biopsy of the dominant nodule. Thyroid ultrasound (04/29/2017): Previously biopsied right nodule is unchanged.  She has 2 additional subcentimeter thyroid nodules, that did not require follow-up.  Also, a hypoechoic lymph node on the right side of the neck, 1.9 cm x 0.5 cm.  PCP palpated her right lymph node at last visit and she recommended a CT scan of the neck.  She did not schedule this yet, but will do so.  Pt denies: - feeling nodules in neck - hoarseness - dysphagia - choking - SOB  with lying down  ROS: Constitutional: no weight gain/no weight loss, no fatigue, no subjective hyperthermia, no subjective hypothermia Eyes: no blurry vision, no xerophthalmia ENT: no sore throat, + see HPI Cardiovascular: no CP/no SOB/no palpitations/no leg swelling Respiratory: + cough/no SOB/no wheezing Gastrointestinal: no N/no V/no D/+ C/no acid reflux Musculoskeletal: no muscle aches/+ joint aches Skin: no rashes, no hair loss Neurological: no tremors/no numbness/no tingling/no dizziness  I reviewed pt's medications, allergies, PMH, social hx, family hx, and changes were documented in the history of present illness. Otherwise, unchanged from my initial visit note.   Past Medical History:  Diagnosis Date  . Allergy    Zyrtec daily.  .  Arthritis    DDD lumbar spine  . Asthma    Albuterol seasonally.  . Diabetes mellitus without complication ([place])   . Glaucoma   . Heart murmur   . Hyperlipidemia   . Hypertension   . Neuromuscular disorder ([place])    R third nerve palsy  . OSA (obstructive sleep apnea) 11/11/2006   CPAP  . Thyroid disease    Thyroid nodules s/p needle biopsy negative.    Also, spondylolisthesis L4-L5  Past Surgical History:  Procedure Laterality Date  . CHOLECYSTECTOMY OPEN  1979   Social History   Social History  . Marital status: Divorced    Spouse name: N/A  . 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 Smoking status: Former Smoker    Packs/day: 0.05    Years: 18.00    Types: Cigarettes    Quit date: 02/2016  . Smokeless tobacco: Never Used  . Alcohol use 0.0 oz/week     Comment: maybe 1-2 drinks per yr  . Drug use: No  . Sexual activity: No   Other Topics Concern  . 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  [employer] work.      Tobacco: 1/2 pdd x 20 years.      Alcohol:  None      Drugs: none       Education: [employer].       Exercise: No.      Seatbelt: 100%      Guns: unloaded.      Sexually active: none; total partners = 5.  Last STD screen 2006.     Current Outpatient Medications on File Prior to Visit  Medication Sig Dispense Refill  . Ascorbic Acid (VITAMIN C) 100 MG tablet Take 100 mg by mouth daily.    Marland Kitchen aspirin 325 MG EC tablet Take 325 mg by mouth daily.    Marland Kitchen b complex vitamins tablet Take 1 tablet by mouth daily.    Marland Kitchen co-enzyme Q-10 30 MG capsule Take 30 mg by mouth 3 (three) times daily.     Marland Kitchen glucose blood (ONE TOUCH ULTRA TEST) test strip TESTING TWICE DAILY 100 each 3  . insulin aspart  (NOVOLOG) 100 UNIT/ML injection Inject 20-25 Units into the skin 3 (three) times daily with meals. 4 vial 5  . Insulin Detemir (LEVEMIR) 100 UNIT/ML Pen Inject under skin 100 units a day as advised 30 mL 5  . Insulin Pen Needle (BD PEN  NEEDLE NANO U/F) 32G X 4 MM MISC USE AS DIRECTED; inject insulin five times daily 100 each 3  . Insulin Syringe-Needle U-100 (B-D INS SYR ULTRAFINE 1CC/31G) 31G X 5/16" 1 ML MISC USE TO GIVE 10 TO 15 UNITS 3 TIMES DAILY BEFORE MEALS 100 each 1  . JARDIANCE 25 MG TABS tablet TAKE ONE TABLET BY MOUTH DAILY 30 tablet 4  . levocetirizine (XYZAL) 5 MG tablet Take 5 mg by mouth every evening.    Marland Kitchen losartan-hydrochlorothiazide (HYZAAR) 100-12.5 MG tablet Take 1 tablet by mouth daily. 90 tablet 1  . metFORMIN (GLUCOPHAGE) 1000 MG tablet TAKE ONE TABLET BY MOUTH TWICE A DAY 180 tablet 2  . Multiple Vitamins-Minerals (MULTIVITAMIN WITH MINERALS) tablet Take 1 tablet by mouth daily.    . pregabalin (LYRICA) 25 MG capsule Take 1-2 capsules (25-50 mg total) by mouth at bedtime. 60 capsule 5  . rosuvastatin (CRESTOR) 10 MG tablet TAKE ONE TABLET BY MOUTH DAILY 90 tablet 3  . TRULICITY 1.5 VO/1.6WV SOPN INJECT 1.5 MG UNDER THE SKIN ONCE WEEKLY AS DIRECTED. 2 pen 0   No current facility-administered medications on file prior to visit.    No Known Allergies Family History  Problem Relation Age of Onset  . Diabetes Mother   . Hypertension Mother   . Ovarian cancer Mother 57  . Breast cancer Mother 71  . Cancer Mother 75       ovarian cancer age 73; breast cancer age 63  . Diabetes Father   . Heart disease Father 50       CAD/CABG; AAA; CHF  . Hyperlipidemia Father   . Hypertension Father   . Prostate cancer Father        dx. 74-83  . Colon polyps Father        section of colon removed; unsure of #  . Heart disease Brother        Died of CHF at age 30.  Marland Kitchen Hyperlipidemia Brother   . Hypertension Brother   . Congestive Heart Failure Brother   . Heart disease Sister 89        3-vessel CAD s/p 1 stenting; CABG  . Diabetes Sister   . Hyperlipidemia Sister   . Hypertension Sister   . Cancer Brother 40       GI- esophageal; smoker  . Heart disease Brother        MIs in 71s and CABG.  . Diabetes Brother   . Hypertension Brother   . Stroke Maternal Grandmother   . Hypertension Maternal Grandmother   . Skin cancer Maternal Grandfather        multiple - unknown types; dx. 46s  . Cancer Maternal Grandfather   . Heart Problems Paternal Grandfather   . Skin cancer Maternal Aunt        multiple - unknown type; dx. 38s  . Pancreatic cancer Maternal Uncle        dx. 70s   PE: There were no vitals taken for this visit. Wt Readings from Last 3 Encounters:  10/15/17 205 lb (93 kg)  07/29/17 205 lb (93 kg)  05/17/17 210 lb 12.8 oz (95.6 kg)   Constitutional: overweight, in NAD Eyes: PERRLA, EOMI, no exophthalmos ENT: moist mucous membranes, no thyromegaly, no cervical lymphadenopathy Cardiovascular: RRR, No MRG Respiratory: CTA B Gastrointestinal: abdomen soft, NT, ND, BS+ Musculoskeletal: no deformities, strength intact in all 4 Skin: moist, warm, no rashes Neurological: no tremor with outstretched hands, DTR normal in all 4  ASSESSMENT:  1. DM2, insulin-dependent, now better controlled, with complications - Peripheral neuropathy.  2. PN  - 2/2 DM2  3. MNG  PLAN:  1. Patient with long-standing, uncontrolled, diabetes, recently with better control, on a complex regimen including basal-bolus insulin, metformin, SGL T-2 inhibitor and GLP-1 receptor agonist.  HbA1c at last visit was 6.2%, improved. - At last visit, sugars were close to goal but they were still high in the morning.  I asked her to check sugars later in the day, also.  She is doing a great job with this now, checking sugars up to 4 times a day at different times of the day. - Sugars are higher (hyperglycemic spikes) due to traveling over the holidays, stress with her this of her nephew,  and t dietary indiscretions.  For now, I suggested that she took a higher dose of NovoLog with a larger meal, but I do not feel we need to change her regimen otherwise. - I suggested to:  Patient Instructions  Please continue: - Metformin 1000 mg 2x a day with meals. - Jardiance 25 mg daily before b'fast - Trulicity 1.5 mcg weekly - Levemir 40 units in a.m. and 60 units at bedtime (pens)  Please increase: - Novolog (vials): - 20 units for a smaller meal - 25 units for a larger meal - 28-30 units for a very large meal   Please return in 3 months with your sugar log.   - today, HbA1c is 6.8%  (higher)  - continue checking sugars at different times of the day - check 2x a day, rotating checks - advised for yearly eye exams >> she is UTD - She is  up-to-date with flu shot - Return to clinic in 3 mo with sugar log    2. PN - She continues on B complex and I also suggested alpha lipoic acid at last visit  3. MNG -Reviewed her most recent thyroid ultrasound report: Right dominant nodule -stable on the latest ultrasound from 04/2017 -Reviewed previous biopsy of this nodule: Benign  -She does not have any neck compression symptoms but we discussed about letting me know if these develop -She has a CT scan of her neck coming up to investigate the right-sided elongated cervical lymph node -No neck compression symptoms.  Philemon Kingdom, MD PhD [facility] Endocrinology

## 2017-10-28 NOTE — Telephone Encounter (Signed)
Order sent to [facility]. Spoke with pt and advised her to give them a call to schedule when she is ready but before auth expires. Spoke with tech and additional labs do not need to be ordered. Thanks!

## 2017-10-28 NOTE — Telephone Encounter (Signed)
Obtained authorization for pt to have CT neck w/o contrast. Called [facility] to get fax number. I was speaking with a tech who asked me what type of CT the pt was having done. She stated they typically do a CT neck w/ contrast instead and the pt would need to have labs. Do we want to switch to CT neck w/ contrast or keep w/o? I will fax appropriate order to [phone]. Thanks!

## 2017-10-28 NOTE — Addendum Note (Signed)
Addended by: Drucilla Schmidt on: 10/28/2017 11:33 AM   Modules accepted: Orders

## 2017-11-11 ENCOUNTER — Other Ambulatory Visit: Payer: Self-pay | Admitting: Internal Medicine

## 2017-11-18 ENCOUNTER — Other Ambulatory Visit: Payer: Self-pay | Admitting: *Deleted

## 2017-11-18 ENCOUNTER — Telehealth: Payer: Self-pay | Admitting: Family Medicine

## 2017-11-18 NOTE — Telephone Encounter (Signed)
Copied from [place] [id number]. Topic: Inquiry >> Nov 18, 2017  9:56 AM Corie Chiquito, NT wrote: Reason for CRM: Junie Panning called because she needs to know if the patient needs a CT Scan with contrast or without contrast. Also stated what they normally do with contrast. Junie Panning stated that she has been trying to get a hold of someone from the office since yesterday but didn't get an answer. If someone could give them a call back at [phone] ask for [facility]

## 2017-11-19 ENCOUNTER — Other Ambulatory Visit: Payer: BC Managed Care – PPO

## 2017-11-19 ENCOUNTER — Ambulatory Visit (INDEPENDENT_AMBULATORY_CARE_PROVIDER_SITE_OTHER): Payer: BC Managed Care – PPO

## 2017-11-19 DIAGNOSIS — R221 Localized swelling, mass and lump, neck: Secondary | ICD-10-CM | POA: Diagnosis not present

## 2017-11-19 MED ORDER — IOPAMIDOL (ISOVUE-300) INJECTION 61%
100.0000 mL | Freq: Once | INTRAVENOUS | Status: AC | PRN
  Administered 2017-11-19: 75 mL via INTRAVENOUS

## 2017-11-20 NOTE — Telephone Encounter (Signed)
Spoke with CT tech, they did with /without yesterday Prior approval given, no note in chart.

## 2017-11-21 ENCOUNTER — Other Ambulatory Visit: Payer: Self-pay | Admitting: Family Medicine

## 2017-12-11 ENCOUNTER — Other Ambulatory Visit: Payer: Self-pay | Admitting: Internal Medicine

## 2017-12-12 ENCOUNTER — Other Ambulatory Visit: Payer: Self-pay | Admitting: Internal Medicine

## 2017-12-24 ENCOUNTER — Other Ambulatory Visit: Payer: Self-pay | Admitting: Internal Medicine

## 2017-12-24 ENCOUNTER — Other Ambulatory Visit: Payer: Self-pay | Admitting: Family Medicine

## 2017-12-24 NOTE — Telephone Encounter (Signed)
BD insulin syringe refill Last OV: 10/15/17 Last Refill:10/08/17 Pharmacy:[facility]

## 2018-01-06 ENCOUNTER — Other Ambulatory Visit: Payer: Self-pay | Admitting: Internal Medicine

## 2018-01-28 ENCOUNTER — Ambulatory Visit: Payer: BC Managed Care – PPO | Admitting: Internal Medicine

## 2018-01-29 ENCOUNTER — Other Ambulatory Visit: Payer: Self-pay | Admitting: Family Medicine

## 2018-01-30 ENCOUNTER — Ambulatory Visit: Payer: BC Managed Care – PPO | Admitting: Neurology

## 2018-01-30 ENCOUNTER — Other Ambulatory Visit: Payer: Self-pay

## 2018-01-30 ENCOUNTER — Encounter: Payer: Self-pay | Admitting: Neurology

## 2018-01-30 ENCOUNTER — Other Ambulatory Visit: Payer: BC Managed Care – PPO

## 2018-01-30 VITALS — BP 148/86 | HR 92 | Ht 63.0 in | Wt 210.0 lb

## 2018-01-30 DIAGNOSIS — G629 Polyneuropathy, unspecified: Secondary | ICD-10-CM | POA: Diagnosis not present

## 2018-01-30 DIAGNOSIS — R2681 Unsteadiness on feet: Secondary | ICD-10-CM | POA: Diagnosis not present

## 2018-01-30 NOTE — Patient Instructions (Signed)
1. Bloodwork for B12, ESR, CRP, SPEP/IFE 2. Schedule EMG/NCV of both LE with Dr. Patel 3. Refer for Balance Therapy 4. Follow-up in 4-5 months, call for any changes 

## 2018-01-30 NOTE — Progress Notes (Signed)
NEUROLOGY CONSULTATION NOTE  Madison Cowan MRN: [id number] DOB: 1961/05/13  Referring provider: Dr. Reginia Forts Primary care provider: Dr. Bing Quarry  Reason for consult:  Unsteady gait  Dear Dr Tamala Julian:  Thank you for your kind referral of Madison Cowan for consultation of the above symptoms. Although her history is well known to you, please allow me to reiterate it for the purpose of our medical record. Records and images were personally reviewed where available.  HISTORY OF PRESENT ILLNESS: This is a 57 year old right-handed woman with a history of hypertension, hyperlipidemia, diabetes, presenting for evaluation of unsteady gait. Symptoms started a couple of years ago, sometimes it is worse than others, but she feels unbalanced pretty much all the time in varying degrees. She denies any dizziness/spinning sensation, except for the past couple of weeks. She would be walking then start staggering like she was drunk. She would be walking the dog and "teeter-tottering around." She mostly notices it when she is standing and has to turn, then stumbles. She fell last November when her knee gave out on her, this has happened over the years on one or the other knee). She has had neuropathy with numbness and tingling up to her knees for several years. If she is driving and doing repetitive movements, her hands go numb. She has occasional burning in her feet but has not been taking Lyrica regularly, she takes it once a week when her feet are really bothering her. One dose of Neurontin made her "wonky." She has some urinary incontinence and constipation. She has had vertigo the past couple of months lasting a few weeks then went away, mostly occurring when laying in bed and turning. She has had sinus headaches recently. No diplopia, dysarthria/dysphagia, neck pain. She always has low grade back pain at L4-5. No family history of similar symptoms.   Laboratory Data: Lab Results  Component Value Date     TSH 2.280 03/18/2017    PAST MEDICAL HISTORY: Past Medical History:  Diagnosis Date  . Allergy    Zyrtec daily.  . Arthritis    DDD lumbar spine  . Asthma    Albuterol seasonally.  . Diabetes mellitus without complication ([place])   . Glaucoma   . Heart murmur   . Hyperlipidemia   . Hypertension   . Neuromuscular disorder ([place])    R third nerve palsy  . OSA (obstructive sleep apnea) 11/11/2006   CPAP  . Thyroid disease    Thyroid nodules s/p needle biopsy negative.      PAST SURGICAL HISTORY: Past Surgical History:  Procedure Laterality Date  . CHOLECYSTECTOMY OPEN  1979    MEDICATIONS: Current Outpatient Medications on File Prior to Visit  Medication Sig Dispense Refill  . Ascorbic Acid (VITAMIN C) 100 MG tablet Take 100 mg by mouth daily.    Marland Kitchen aspirin 325 MG EC tablet Take 325 mg by mouth daily.    Marland Kitchen b complex vitamins tablet Take 1 tablet by mouth daily.    . BD INSULIN SYRINGE U/F 31G X 5/16" 1 ML MISC USE TO GIVE 10 TO 15 UNITS 3 TIMES DAILY BEFORE MEALS 100 each 1  . co-enzyme Q-10 30 MG capsule Take 30 mg by mouth 3 (three) times daily.     Marland Kitchen glucose blood (ONE TOUCH ULTRA TEST) test strip TEST TWICE DAILY 100 each 2  . Insulin Detemir (LEVEMIR) 100 UNIT/ML Pen Inject under skin 100 units a day as advised 30 mL 5  . Insulin  Pen Needle (BD PEN NEEDLE NANO U/F) 32G X 4 MM MISC USE AS DIRECTED; inject insulin five times daily 100 each 3  . JARDIANCE 25 MG TABS tablet TAKE ONE TABLET BY MOUTH DAILY 30 tablet 4  . LEVEMIR FLEXTOUCH 100 UNIT/ML Pen INJECT 60 UNITS UNDER THE SKIN TWO TIMES A DAY 30 pen 4  . levocetirizine (XYZAL) 5 MG tablet Take 5 mg by mouth every evening.    Marland Kitchen losartan-hydrochlorothiazide (HYZAAR) 100-12.5 MG tablet TAKE ONE TABLET BY MOUTH DAILY 90 tablet 0  . metFORMIN (GLUCOPHAGE) 1000 MG tablet TAKE ONE TABLET BY MOUTH TWICE A DAY 180 tablet 2  . Multiple Vitamins-Minerals (MULTIVITAMIN WITH MINERALS) tablet Take 1 tablet by mouth daily.    Marland Kitchen  NOVOLOG 100 UNIT/ML injection INJECT 20 TO 25 UNITS INTO THE SKIN THREE TIMES DAILY WITH MEALS 40 mL 4  . pregabalin (LYRICA) 25 MG capsule Take 1-2 capsules (25-50 mg total) by mouth at bedtime. 60 capsule 5  . rosuvastatin (CRESTOR) 10 MG tablet TAKE ONE TABLET BY MOUTH DAILY 90 tablet 3  . TRULICITY 1.5 ZO/1.0RU SOPN INJECT 1.5 MG UNDER THE SKIN ONCE WEEKLY AS DIRECTED. 2 pen 0  . TRULICITY 1.5 EA/5.4UJ SOPN INJECT 1.5 MG UNDER THE SKIN ONCE WEEKLY 2 pen 0   No current facility-administered medications on file prior to visit.     ALLERGIES: No Known Allergies  FAMILY HISTORY: Family History  Problem Relation Age of Onset  . Diabetes Mother   . Hypertension Mother   . Ovarian cancer Mother 44  . Breast cancer Mother 19  . Cancer Mother 76       ovarian cancer age 49; breast cancer age 23  . Diabetes Father   . Heart disease Father 84       CAD/CABG; AAA; CHF  . Hyperlipidemia Father   . Hypertension Father   . Prostate cancer Father        dx. 64-83  . Colon polyps Father        section of colon removed; unsure of #  . Heart disease Brother        Died of CHF at age 57.  Marland Kitchen Hyperlipidemia Brother   . Hypertension Brother   . Congestive Heart Failure Brother   . Heart disease Sister 95       3-vessel CAD s/p 1 stenting; CABG  . Diabetes Sister   . Hyperlipidemia Sister   . Hypertension Sister   . Cancer Brother 44       GI- esophageal; smoker  . Heart disease Brother        MIs in 32s and CABG.  . Diabetes Brother   . Hypertension Brother   . Stroke Maternal Grandmother   . Hypertension Maternal Grandmother   . Skin cancer Maternal Grandfather        multiple - unknown types; dx. 21s  . Cancer Maternal Grandfather   . Heart Problems Paternal Grandfather   . Skin cancer Maternal Aunt        multiple - unknown type; dx. 41s  . Pancreatic cancer Maternal Uncle        dx. 37s    SOCIAL HISTORY: Social History   Socioeconomic History  . Marital status:  Divorced    Spouse name: Not on file  . Number of children: 0  . Years of education: Not on file  . Highest education level: Not on file  Occupational History  . 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  Financial resource strain: Not on file  . Food insecurity:    Worry: Not on file    Inability: Not on file  . Transportation needs:    Medical: Not on file    Non-medical: Not on file  Tobacco Use  . Smoking status: Former Smoker    Packs/day: 0.05    Years: 18.00    Pack years: 0.90    Types: Cigarettes    Last attempt to quit: 02/2016    Years since quitting: 1.9  . Smokeless tobacco: Never Used  Substance and Sexual Activity  . Alcohol use: Yes    Alcohol/week: 0.0 oz    Comment: maybe 1-2 drinks per yr  . Drug use: No  . Sexual activity: Never  Lifestyle  . Physical activity:    Days per week: Not on file    Minutes per session: Not on file  . Stress: Not on file  Relationships  . Social connections:    Talks on phone: Not on file    Gets together: Not on file    Attends religious service: Not on file    Active member of club or organization: Not on file    Attends meetings of clubs or organizations: Not on file    Relationship status: Not on file  . Intimate partner violence:    Fear of current or ex partner: Not on file    Emotionally abused: Not on file    Physically abused: Not on file    Forced sexual activity: Not on file  Other Topics Concern  . Not on file  Social History Narrative   Marital status: Divorced after 25 years of marriage; not dating in 2018; not interested.      Children:  None       Lives: alone, 1 dog.       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  [place] work.  Works 50 hours per week.  No longer working on weekends in 2018.      Tobacco:   QUIT 03/01/2016!!!!!!  Smoked for 20 years      Alcohol:  None      Drugs: none       Education: [employer].       Exercise: No.       Seatbelt: 100%; no texting      Guns: unloaded.      Sexually active: none; total partners = 5.  Last STD screen 2006.      REVIEW OF SYSTEMS: Constitutional: No fevers, chills, or sweats, no generalized fatigue, change in appetite Eyes: No visual changes, double vision, eye pain Ear, nose and throat: No hearing loss, ear pain, nasal congestion, sore throat Cardiovascular: No chest pain, palpitations Respiratory:  No shortness of breath at rest or with exertion, wheezes GastrointestinaI: No nausea, vomiting, diarrhea, abdominal pain, fecal incontinence Genitourinary:  No dysuria, urinary retention or frequency Musculoskeletal:  No neck pain, +back pain Integumentary: No rash, pruritus, skin lesions Neurological: as above Psychiatric: No depression, insomnia, anxiety Endocrine: No palpitations, fatigue, diaphoresis, mood swings, change in appetite, change in weight, increased thirst Hematologic/Lymphatic:  No anemia, purpura, petechiae. Allergic/Immunologic: no itchy/runny eyes, nasal congestion, recent allergic reactions, rashes  PHYSICAL EXAM: Vitals:   01/30/18 1016  BP: (!) 148/86  Pulse: 92  SpO2: 94%   General: No acute distress Head:  Normocephalic/atraumatic Eyes: Fundoscopic exam shows bilateral sharp discs, no vessel changes, exudates, or hemorrhages Neck: supple, no paraspinal tenderness,  full range of motion Back: No paraspinal tenderness Heart: regular rate and rhythm Lungs: Clear to auscultation bilaterally. Vascular: No carotid bruits. Skin/Extremities: No rash, no edema Neurological Exam: Mental status: alert and oriented to person, place, and time, no dysarthria or aphasia, Fund of knowledge is appropriate.  Recent and remote memory are intact.  Attention and concentration are normal.    Able to name objects and repeat phrases. Cranial nerves: CN I: not tested CN II: pupils equal, round and reactive to light, visual fields intact, fundi unremarkable. CN III,  IV, VI:  full range of motion, no nystagmus, no ptosis CN V: facial sensation intact CN VII: upper and lower face symmetric CN VIII: hearing intact to finger rub CN IX, X: gag intact, uvula midline CN XI: sternocleidomastoid and trapezius muscles intact CN XII: tongue midline Bulk & Tone: normal, no fasciculations. Motor: 5/5 throughout with no pronator drift. Sensation: intact to light touch, cold, pin on both UE, decreased pin, cold, vibration to ankles bilaterally.  No extinction to double simultaneous stimulation.  Romberg test positive Deep Tendon Reflexes: +1 both UE, +2bilateral patella and right ankle, +1 left ankle, no ankle clonus Plantar responses: downgoing bilaterally Cerebellar: no incoordination on finger to nose, heel to shin. No dysdiadochokinesia Gait: narrow-based, cautious, difficulty with tandem walk Tremor: none  IMPRESSION: This is a 57 year old right-handed woman with a history of hypertension, hyperlipidemia, diabetes, presenting with gait instability. Exam shows evidence of a length-dependent neuropathy, which can cause gait instability/imbalance. Neuropathy labs and EMG/NCV of both lower extremities will be ordered to further evaluate symptoms. We discussed that treatment would be balance therapy, she agrees with referral. She will follow-up in 4-5 months and knows to call for any changes.   Thank you for allowing me to participate in the care of this patient. Please do not hesitate to call for any questions or concerns.   Ellouise Newer, M.D.  CC: Dr. Tamala Julian

## 2018-02-03 LAB — PROTEIN ELECTROPHORESIS, SERUM
ALPHA 1: 0.3 g/dL (ref 0.2–0.3)
Albumin ELP: 4.1 g/dL (ref 3.8–4.8)
Alpha 2: 0.8 g/dL (ref 0.5–0.9)
BETA 2: 0.4 g/dL (ref 0.2–0.5)
Beta Globulin: 0.5 g/dL (ref 0.4–0.6)
GAMMA GLOBULIN: 0.8 g/dL (ref 0.8–1.7)
TOTAL PROTEIN: 6.8 g/dL (ref 6.1–8.1)

## 2018-02-03 LAB — IMMUNOFIXATION ELECTROPHORESIS
IgG (Immunoglobin G), Serum: 876 mg/dL (ref 694–1618)
IgM, Serum: 73 mg/dL (ref 48–271)
Immunoglobulin A: 199 mg/dL (ref 81–463)

## 2018-02-03 LAB — SEDIMENTATION RATE: SED RATE: 6 mm/h (ref 0–30)

## 2018-02-03 LAB — VITAMIN B12: VITAMIN B 12: 398 pg/mL (ref 200–1100)

## 2018-02-08 ENCOUNTER — Other Ambulatory Visit: Payer: Self-pay | Admitting: Internal Medicine

## 2018-02-10 ENCOUNTER — Telehealth: Payer: Self-pay

## 2018-02-10 ENCOUNTER — Encounter: Payer: Self-pay | Admitting: Neurology

## 2018-02-10 NOTE — Telephone Encounter (Signed)
Spoke with pt relaying message below.   

## 2018-02-10 NOTE — Telephone Encounter (Signed)
-----   Message from Cameron Sprang, MD sent at 02/10/2018 12:26 PM EDT ----- Pls let her know overall the bloodwork was normal. There was a slight change in the amount of proteins, but not significant, I will recheck it in 6 months. Thanks

## 2018-02-11 ENCOUNTER — Other Ambulatory Visit: Payer: Self-pay | Admitting: Internal Medicine

## 2018-02-12 ENCOUNTER — Ambulatory Visit (INDEPENDENT_AMBULATORY_CARE_PROVIDER_SITE_OTHER): Payer: BC Managed Care – PPO | Admitting: Neurology

## 2018-02-12 DIAGNOSIS — G629 Polyneuropathy, unspecified: Secondary | ICD-10-CM

## 2018-02-12 DIAGNOSIS — R2681 Unsteadiness on feet: Secondary | ICD-10-CM | POA: Diagnosis not present

## 2018-02-12 NOTE — Procedures (Signed)
[facility]  [address]  [place], [place] [address] Tel: [phone] Fax:  [phone] Test Date:  02/12/2018  Patient: Madison Cowan DOB: [birth year] Physician: Narda Amber, DO  Sex: Female Height: 5\' 3"  Ref Phys: Ellouise Newer, MD  ID#: [id number] Temp: 36.2C Technician:    Patient Complaints: This is a 57 year old female referred for evaluation of gait instability and falls.  NCV & EMG Findings: Extensive electrodiagnostic testing of the right lower extremity and additional studies of the left shows:  1. Right superficial peroneal and left sural sensory responses are absent. Right sural sensory response shows reduced amplitude. Left superficial peroneal sensory responses within normal limits. 2. Right peroneal (EDB) and tibial motor responses show reduced amplitude. Right peroneal motor response at the tibialis anterior is within normal limits. Left peroneal and tibial motor responses are within normal limits. 3. Bilateral tibial H reflex studies are within normal limits. 4. Chronic motor axon loss changes are seen affecting the muscles below the knee bilaterally. There is no evidence of accompanied active denervation.  Impression: The electrophysiologic findings are most consistent with a chronic sensorimotor polyneuropathy, axon loss in type, affecting the lower extremities. These findings are moderate in degree electrically and worse on the right.   ___________________________ Narda Amber, DO    Nerve Conduction Studies Anti Sensory Summary Table   Site NR Peak (ms) Norm Peak (ms) P-T Amp (V) Norm P-T Amp  Left Sup Peroneal Anti Sensory (Ant Lat Mall)  36.2C  12 cm    2.8 <4.6 7.8 >4  Right Sup Peroneal Anti Sensory (Ant Lat Mall)  36.2C  12 cm NR  <4.6  >4  Left Sural Anti Sensory (Lat Mall)  36.2C  Calf NR  <4.6  >4  Right Sural Anti Sensory (Lat Mall)  36.2C  Calf    3.2 <4.6 3.7 >4   Motor Summary Table   Site NR Onset (ms) Norm  Onset (ms) O-P Amp (mV) Norm O-P Amp Site1 Site2 Delta-0 (ms) Dist (cm) Vel (m/s) Norm Vel (m/s)  Left Peroneal Motor (Ext Dig Brev)  36.2C  Ankle    3.4 <6.0 5.5 >2.5 B Fib Ankle 7.9 35.0 44 >40  B Fib    11.3  4.9  Poplt B Fib 1.0 7.0 70 >40  Poplt    12.3  4.8         Right Peroneal Motor (Ext Dig Brev)  36.2C  Ankle    3.4 <6.0 1.5 >2.5 B Fib Ankle 7.1 35.0 49 >40  B Fib    10.5  1.3  Poplt B Fib 1.1 8.0 73 >40  Poplt    11.6  1.3         Right Peroneal TA Motor (Tib Ant)  36.2C  Fib Head    3.7 <4.5 7.2 >3 Poplit Fib Head 1.1 7.0 64 >40  Poplit    4.8  7.1         Left Tibial Motor (Abd Hall Brev)  36.2C  Ankle    4.1 <6.0 6.3 >4 Knee Ankle 8.9 38.0 43 >40  Knee    13.0  5.1         Right Tibial Motor (Abd Hall Brev)  36.2C  Ankle    3.7 <6.0 2.2 >4 Knee Ankle 8.9 37.0 42 >40  Knee    12.6  1.8          H Reflex Studies   NR H-Lat (ms) Lat Norm (ms) L-R H-Lat (  ms)  Left Tibial (Gastroc)  36.2C     31.70 <35 0.00  Right Tibial (Gastroc)  36.2C     31.70 <35 0.00   EMG   Side Muscle Ins Act Fibs Psw Fasc Number Recrt Dur Dur. Amp Amp. Poly Poly. Comment  Right AntTibialis Nml Nml Nml Nml 1- Rapid Some 1+ Some 1+ Some 1+ N/A  Right Gastroc Nml Nml Nml Nml Nml Nml Nml Nml Nml Nml Nml Nml N/A  Right Flex Dig Long Nml Nml Nml Nml 2- Rapid Some 1+ Some 1+ Nml Nml N/A  Right RectFemoris Nml Nml Nml Nml Nml Nml Nml Nml Nml Nml Nml Nml N/A  Right GluteusMed Nml Nml Nml Nml Nml Nml Nml Nml Nml Nml Nml Nml N/A  Right BicepsFemS Nml Nml Nml Nml Nml Nml Nml Nml Nml Nml Nml Nml N/A  Left AntTibialis Nml Nml Nml Nml 1- Rapid Some 1+ Some 1+ Nml Nml N/A  Left Gastroc Nml Nml Nml Nml 1- Rapid Some 1+ Some 1+ Nml Nml N/A  Left Flex Dig Long Nml Nml Nml Nml 2- Rapid Some 1+ Some 1+ Nml Nml N/A  Left RectFemoris Nml Nml Nml Nml Nml Nml Nml Nml Nml Nml Nml Nml N/A  Left GluteusMed Nml Nml Nml Nml Nml Nml Nml Nml Nml Nml Nml Nml N/A  Left BicepsFemS Nml Nml Nml Nml Nml Nml Nml Nml Nml  Nml Nml Nml N/A      Waveforms:

## 2018-02-13 ENCOUNTER — Telehealth: Payer: Self-pay

## 2018-02-13 DIAGNOSIS — G629 Polyneuropathy, unspecified: Secondary | ICD-10-CM

## 2018-02-13 DIAGNOSIS — R2681 Unsteadiness on feet: Secondary | ICD-10-CM

## 2018-02-13 NOTE — Telephone Encounter (Signed)
-----   Message from Cameron Sprang, MD sent at 02/13/2018 12:32 PM EDT ----- Pls let her know the nerve test confirmed moderate neuropathy, which is the likely cause of her balance issues. Balance therapy is the treatment, proceed with referral, thanks!

## 2018-02-13 NOTE — Telephone Encounter (Signed)
Spoke with pt relaying message below.   

## 2018-02-18 ENCOUNTER — Encounter: Payer: Self-pay | Admitting: Physician Assistant

## 2018-02-24 ENCOUNTER — Other Ambulatory Visit: Payer: Self-pay | Admitting: Family Medicine

## 2018-03-03 ENCOUNTER — Encounter: Payer: Self-pay | Admitting: Rehabilitation

## 2018-03-03 ENCOUNTER — Ambulatory Visit: Payer: BC Managed Care – PPO | Attending: Neurology | Admitting: Rehabilitation

## 2018-03-03 ENCOUNTER — Other Ambulatory Visit: Payer: Self-pay

## 2018-03-03 DIAGNOSIS — R2681 Unsteadiness on feet: Secondary | ICD-10-CM | POA: Insufficient documentation

## 2018-03-03 DIAGNOSIS — R42 Dizziness and giddiness: Secondary | ICD-10-CM | POA: Diagnosis present

## 2018-03-03 DIAGNOSIS — R208 Other disturbances of skin sensation: Secondary | ICD-10-CM | POA: Diagnosis present

## 2018-03-03 NOTE — Therapy (Signed)
[facility] [place] [address] [place], [place], [address] Phone: [phone]   Fax:  [phone]  Physical Therapy Evaluation  Patient Details  Name: Madison Cowan MRN: [id number] Date of Birth: Jul 12, 1961 Referring Provider: Ellouise Newer, MD   Encounter Date: 03/03/2018  PT End of Session - 03/03/18 1036    Visit Number  1    Number of Visits  13    Date for PT Re-Evaluation  05/02/18    Authorization Type  BCBS    PT Start Time  0925    PT Stop Time  1018    PT Time Calculation (min)  53 min    Activity Tolerance  Patient tolerated treatment well    Behavior During Therapy  [facility] for tasks assessed/performed       Past Medical History:  Diagnosis Date  . Allergy    Zyrtec daily.  . Arthritis    DDD lumbar spine  . Asthma    Albuterol seasonally.  . Diabetes mellitus without complication ([place])   . Glaucoma   . Heart murmur   . Hyperlipidemia   . Hypertension   . Neuromuscular disorder ([place])    R third nerve palsy  . OSA (obstructive sleep apnea) 11/11/2006   CPAP  . Thyroid disease    Thyroid nodules s/p needle biopsy negative.      Past Surgical History:  Procedure Laterality Date  . CHOLECYSTECTOMY OPEN  1979    There were no vitals filed for this visit.   Subjective Assessment - 03/03/18 0925    Subjective  "I've been having some balance issues for a while.  I was sent to Dr. Delice Lesch and then had the EMG done.  They both wanted me to come for balance therapy.  I do good in a straight line, but when doing things in close proximity or making turns, I lose balance.  I fell back in November and my knee gave way on me."     Limitations  House hold activities;Walking    How long can you walk comfortably?  Can walk comfortably 20-30 mins    Diagnostic tests  Nerve conduction reports peripheral neuropathy    Patient Stated Goals  "I'd like to have better balance. I don't want to start falling."     Currently  in Pain?  Yes has chronic low back pain    Pain Score  2     Pain Location  Generalized    Pain Descriptors / Indicators  Aching;Dull    Pain Type  Chronic pain    Pain Onset  More than a month ago    Pain Frequency  Constant    Aggravating Factors   lifting, sit too long    Pain Relieving Factors  rest         [facility] PT Assessment - 03/03/18 0001      Assessment   Medical Diagnosis  Peripheral neuropathy    Referring Provider  Ellouise Newer, MD    Onset Date/Surgical Date  -- noticed declining balance over the last 2 years    Prior Therapy  Had PT in past for low back pain for sciatica      Precautions   Precautions  Fall      Balance Screen   Has the patient fallen in the past 6 months  Yes    How many times?  1    Has the patient had a decrease in activity level because of a fear of  falling?   No    Is the patient reluctant to leave their home because of a fear of falling?   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    Leisure  [place] out with friends/family, go to movies, does go to the beach intermittently with sister      Cognition   Overall Cognitive Status  Within Functional Limits for tasks assessed      Sensation   Light Touch  Impaired Detail    Light Touch Impaired Details  Impaired RLE;Impaired LLE decreased sensation from feet, up to just above knees    Hot/Cold  Appears Intact      ROM / Strength   AROM / PROM / Strength  Strength      Strength    Overall Strength  Within functional limits for tasks performed B hip flex grossly 4/5      Transfers   Transfers  Sit to Stand;Stand to Sit    Sit to Stand  7: Independent    Five time sit to stand comments   12.50 secs without UE support     Stand to Sit  7: Independent      Ambulation/Gait   Ambulation/Gait  Yes    Ambulation/Gait Assistance  6: Modified independent (Device/Increase time);4: Min guard min/guard with balance challenges    Ambulation Distance (Feet)  400 Feet    Assistive device  None    Gait Pattern  Step-through pattern;Decreased stride length;Trunk flexed;Narrow base of support    Ambulation Surface  Level;Indoor    Gait velocity  3.38 ft/sec     Stairs  Yes    Stairs Assistance  6: Modified independent (Device/Increase time)    Stair Management Technique  One rail Right;Alternating pattern;Forwards    Number of Stairs  4    Height of Stairs  6      Functional Gait  Assessment   Gait assessed   Yes    Gait Level Surface  Walks 20 ft in less than 7 sec but greater than 5.5 sec, uses assistive device, slower speed, mild gait deviations, or deviates 6-10 in outside of the 12 in walkway width.    Change in Gait Speed  Able to smoothly change walking speed without loss of balance or gait deviation. Deviate no more than 6 in outside of the 12 in walkway width.    Gait with Horizontal Head Turns  Performs head turns smoothly with slight change in gait velocity (eg, minor disruption to smooth gait path), deviates 6-10 in outside 12 in walkway width, or uses an assistive device.    Gait with Vertical Head Turns  Performs task with moderate change in gait velocity, slows down, deviates 10-15 in outside 12 in walkway width but recovers, can continue to walk.    Gait and  Pivot Turn  Pivot turns safely within 3 sec and stops quickly with no loss of balance.    Step Over Obstacle  Is able to step over one shoe box (4.5 in total height) without changing gait speed. No evidence of  imbalance.    Gait with Narrow Base of Support  Ambulates less than 4 steps heel to toe or cannot perform without assistance.    Gait with Eyes Closed  Walks 20 ft, slow speed, abnormal gait pattern, evidence for imbalance, deviates 10-15 in outside 12 in walkway width. Requires more than 9 sec to ambulate 20 ft.    Ambulating Backwards  Walks 20 ft, uses assistive device, slower speed, mild gait deviations, deviates 6-10 in outside 12 in walkway width.    Steps  Alternating feet, must use rail.    Total Score  18    FGA comment:  < 19 = high risk fall           Vestibular Assessment - 03/03/18 0001      Symptom Behavior   Type of Dizziness  Spinning    Frequency of Dizziness  when moving in bed    Duration of Dizziness  short duration    Aggravating Factors  Lying supine;Supine to sit    Relieving Factors  Rest      Occulomotor Exam   Occulomotor Alignment  Normal    Spontaneous  Absent    Gaze-induced  Absent      Positional Testing   Dix-Hallpike  Dix-Hallpike Right;Dix-Hallpike Left      Dix-Hallpike Right   Dix-Hallpike Right Duration  none    Dix-Hallpike Right Symptoms  No nystagmus      Dix-Hallpike Left   Dix-Hallpike Left Duration  short duration 4-5 secs    Dix-Hallpike Left Symptoms  Downbeat, left rotatory nystagmus          Objective measurements completed on examination: See above findings.       Vestibular Treatment/Exercise - 03/03/18 0001      Vestibular Treatment/Exercise   Vestibular Treatment Provided  Canalith Repositioning    Canalith Repositioning  Epley Manuever Left       EPLEY MANUEVER LEFT   Number of Reps   1    Overall Response   Symptoms Resolved            PT Education - 03/03/18 1035    Education provided  Yes    Education Details  evaluation findings, POC, goals, BPPV and treatment    Person(s) Educated  Patient    Methods  Explanation    Comprehension  Verbalized understanding       PT Short Term Goals -  03/03/18 1046      PT SHORT TERM GOAL #1   Title  Pt will initiate HEP in order to indicate decreased fall risk and improved functional mobility (Target Date: 04/02/18)    Time  4    Period  Weeks    Status  New    Target Date  04/02/18      PT SHORT TERM GOAL #2   Title  Pt will report positional dizziness has resolved in order to indicate BPPV was cleared.     Time  4    Period  Weeks    Status  New      PT SHORT TERM GOAL #3   Title  Pt will ambulate 300' over indoor surfaces while scanning environment at mod I level in order to indicate safe community  negotiation.      Time  4    Period  Weeks    Status  New      PT SHORT TERM GOAL #4   Title  Pt will improve FGA to 21/30 in order to indicate decreased fall risk.      Time  4    Period  Weeks    Status  New        PT Long Term Goals - 03/03/18 1048      PT LONG TERM GOAL #1   Title  Pt will be independent with final HEP in order to indicate decreased fall risk and improved functional mobility.  (Target Date: 05/02/18)    Time  8    Period  Weeks    Status  New    Target Date  05/02/18      PT LONG TERM GOAL #2   Title  Pt will improve FGA to >/=24/30 in order to indicate decreased fall risk.      Time  8    Period  Weeks    Status  New      PT LONG TERM GOAL #3   Title  Pt will ambulate >1000' over varying outdoor surfaces (including grass) while scanning environment without overt LOB at mod I level in order to indicate safe community and leisure mobility.     Time  8    Period  Weeks    Status  New      PT LONG TERM GOAL #4   Title  Pt will tolerate standing on foam airex x 30 secs with feet together, EC with minimal postural sway.     Time  8    Period  Weeks    Status  New             Plan - 03/03/18 1037    Clinical Impression Statement  Pt presents with worsening balance over the last couple of years.  Note recent fall in November and continued declining balance with recent diagnosis of  peripheral neuropathy.  Note history of DM, HTN, HDL.  Also note recent complaints of dizziness when moving in bed.  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  Upon PT evaluation note decreased sensation from knees to feet bilaterally, gait speed WFL, some proximal hip weakness, however did note FGA score of 18/30 indicative of elevated/high fall risk and also note pt positive for L posterior canal BPPV.  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  Pt will benefit from skilled OP neuro PT in order to address deficits.      History and Personal Factors relevant to plan of care:  see above    Clinical Presentation  Evolving    Clinical Presentation due to:  see above    Clinical Decision Making  Moderate    Rehab Potential  Good    Clinical Impairments Affecting Rehab Potential  co-morbidities    PT Frequency  2x / week then 1x/wk for 4 more weeks    PT Duration  4 weeks    PT Treatment/Interventions  ADLs/Self Care Home Management;Canalith Repostioning;Gait training;Stair training;Functional mobility training;Therapeutic activities;Therapeutic exercise;Balance training;Neuromuscular re-education;Patient/family education;Vestibular;Passive range of motion    PT Next Visit Plan  Re-check for L BPPV if needed, Initiate HEP to challenge vestibular system (EC on compliant surface), high level balance, SLS  Consulted and Agree with Plan of Care  Patient       Patient will benefit from skilled therapeutic intervention in order to improve the following deficits and impairments:  Decreased balance, Decreased strength, Dizziness, Impaired flexibility, Postural dysfunction, Impaired sensation  Visit Diagnosis: Unsteadiness on feet  Other disturbances of skin sensation  Dizziness and giddiness     Problem List Patient Active Problem List   Diagnosis Date Noted  . Type 2  diabetes, controlled, with peripheral neuropathy ([place]) 11/01/2016  . OSA on CPAP 09/08/2015  . Family history of colonic polyps 07/28/2015  . Genetic testing 07/28/2015  . Family history of breast cancer in mother 02/04/2015  . Family history of ovarian cancer 02/04/2015  . Spondylolisthesis at L4-L5 level 05/06/2014  . Multinodular goiter 11/26/2011  . Gout 11/26/2011  . Hyperlipidemia 09/10/2010  . Overweight 09/10/2010  . TOBACCO ABUSE 09/10/2010  . HYPERTENSION, BENIGN 09/10/2010    Cameron Sprang, PT, MPT [facility] [address] [place], [place], [address] Phone: [phone]   Fax:  [phone] 03/03/18, 10:53 AM  Name: Madison Cowan MRN: [id number] Date of Birth: [birth year]

## 2018-03-05 ENCOUNTER — Ambulatory Visit: Payer: BC Managed Care – PPO | Admitting: Rehabilitative and Restorative Service Providers"

## 2018-03-05 ENCOUNTER — Encounter: Payer: Self-pay | Admitting: Rehabilitative and Restorative Service Providers"

## 2018-03-05 DIAGNOSIS — R42 Dizziness and giddiness: Secondary | ICD-10-CM

## 2018-03-05 DIAGNOSIS — R2681 Unsteadiness on feet: Secondary | ICD-10-CM

## 2018-03-05 DIAGNOSIS — R208 Other disturbances of skin sensation: Secondary | ICD-10-CM | POA: Diagnosis not present

## 2018-03-05 NOTE — Patient Instructions (Addendum)
Stretch out strap - [employer]   HIP: Hamstrings - Supine  Place strap around foot. Raise leg up, keeping knee straight.  Bend opposite knee to protect back if indicated. Hold 30 seconds. 3 reps per set, 2-3 sets per day   Piriformis Stretch   Lying on back, pull right knee toward opposite shoulder. Hold 30 seconds. Repeat 3 times. Do 2-3 sessions per day.  Bridging    Slowly raise buttocks from floor, keeping core tight. Hold 5-10 sec  Repeat __10__ times per set. Do _1-2___ sets per session. Do __1__ sessions per day.   Strengthening: Hip Abduction - Resisted    With tubing around right leg, other side toward anchor, extend leg out from side. Repeat __10__ times per set. Do __2-3__ sets per session. Do _1___ sessions per day.   Anterior Step-Up    Stand with __6-8_ inch step placed in A direction. Step onto step with right foot without pushing off with the ground foot. Finish with ground foot in touch balance on step and return _10__ times. _1-2__ sets _1__ times per day.   Lateral Step Up    Stand to side of step. Step up leading with left leg. Tap heel down with opposite leg. Perform __10-20_ reps.     Balance: Unilateral    Attempt to balance on left leg, eyes open. Hold __20-30__ seconds. Repeat __3-5__ times per set. Do __2-3__ sessions per day. Perform exercise with eyes closed for extra challenge.   Balance: Unilateral - Forward Lean    Stand on left foot, hands on hips. Keeping hips level, bend forward as if to touch forehead to wall. Hold _5___ seconds. Relax. Repeat __10__ times per set. Do __1-2__ sets per session. Do _1-2___ sessions per day.

## 2018-03-05 NOTE — Therapy (Signed)
[place], [place], [address] Phone: [phone]   Fax:  [phone]  Physical Therapy Treatment  Patient Details  Name: Madison Cowan MRN: [id number] Date of Birth: April 18, 1961 Referring Provider: Ellouise Newer, MD   Encounter Date: 03/05/2018  PT End of Session - 03/05/18 0806    Visit Number  2    Number of Visits  13    Date for PT Re-Evaluation  05/02/18    Authorization Type  BCBS    PT Start Time  0803    PT Stop Time  0846    PT Time Calculation (min)  43 min    Activity Tolerance  Patient tolerated treatment well       Past Medical History:  Diagnosis Date  . Allergy    Zyrtec daily.  . Arthritis    DDD lumbar spine  . Asthma    Albuterol seasonally.  . Diabetes mellitus without complication ([place])   . Glaucoma   . Heart murmur   . Hyperlipidemia   . Hypertension   . Neuromuscular disorder ([place])    R third nerve palsy  . OSA (obstructive sleep apnea) 11/11/2006   CPAP  . Thyroid disease    Thyroid nodules s/p needle biopsy negative.      Past Surgical History:  Procedure Laterality Date  . CHOLECYSTECTOMY OPEN  1979    There were no vitals filed for this visit.  Subjective Assessment - 03/05/18 0806    Subjective  No problems with vertigo since treatment at neuro. Trouble with walking slower, getting up from chair; walking dog; turning. Does OK walking distances without stop start. Did have a fall in November when her knee "buckled". Sometimes it does that - usually catches herself but did not that day.     Currently in Pain?  Yes    Pain Score  2     Pain Location  Generalized LBP    Pain Descriptors / Indicators  Aching;Dull    Pain Type  Chronic pain    Pain Onset  More than a month ago    Pain Frequency  Constant                       [facility] Adult PT Treatment/Exercise - 03/05/18 0001      Knee/Hip Exercises: Stretches   Passive Hamstring Stretch   Left;Both;2 reps;30 seconds    Piriformis Stretch  Left;Both;2 reps;30 seconds      Knee/Hip Exercises: Aerobic   Nustep  L5 x 5 min       Knee/Hip Exercises: Standing   Lateral Step Up  Right;Left;2 sets;10 reps;Hand Hold: 2 heel tap without full weight shift     Forward Step Up  Right;Left;2 sets;10 reps;Step Height: 6";Hand Hold: 1;Hand Hold: 2    SLS  30 sec x 3 reps hand hold as needed for balance     SLS with Vectors  bent forward SLS to touch table x 10 reps       Knee/Hip Exercises: Supine   Bridges  Strengthening;Both;1 set;10 reps 5 sec hold              PT Education - 03/05/18 0834    Education provided  Yes    Education Details  HEP     Person(s) Educated  Patient    Methods  Explanation;Demonstration;Tactile cues;Verbal cues;Handout    Comprehension  Verbalized understanding;Returned demonstration;Verbal cues required;Tactile cues required  PT Short Term Goals - 03/03/18 1046      PT SHORT TERM GOAL #1   Title  Pt will initiate HEP in order to indicate decreased fall risk and improved functional mobility (Target Date: 04/02/18)    Time  4    Period  Weeks    Status  New    Target Date  04/02/18      PT SHORT TERM GOAL #2   Title  Pt will report positional dizziness has resolved in order to indicate BPPV was cleared.     Time  4    Period  Weeks    Status  New      PT SHORT TERM GOAL #3   Title  Pt will ambulate 300' over indoor surfaces while scanning environment at mod I level in order to indicate safe community negotiation.      Time  4    Period  Weeks    Status  New      PT SHORT TERM GOAL #4   Title  Pt will improve FGA to 21/30 in order to indicate decreased fall risk.      Time  4    Period  Weeks    Status  New        PT Long Term Goals - 03/03/18 1048      PT LONG TERM GOAL #1   Title  Pt will be independent with final HEP in order to indicate decreased fall risk and improved functional mobility.  (Target Date: 05/02/18)     Time  8    Period  Weeks    Status  New    Target Date  05/02/18      PT LONG TERM GOAL #2   Title  Pt will improve FGA to >/=24/30 in order to indicate decreased fall risk.      Time  8    Period  Weeks    Status  New      PT LONG TERM GOAL #3   Title  Pt will ambulate >1000' over varying outdoor surfaces (including grass) while scanning environment without overt LOB at mod I level in order to indicate safe community and leisure mobility.     Time  8    Period  Weeks    Status  New      PT LONG TERM GOAL #4   Title  Pt will tolerate standing on foam airex x 30 secs with feet together, EC with minimal postural sway.     Time  8    Period  Weeks    Status  New            Plan - 03/05/18 0834    Clinical Impression Statement  Good resolutiion of vertigo symptoms with Epley's Maneuver. Patient tolerated stretching, strengthening, balance activities without difficulty. Very motivated. No golas accomplished - 2nd visit.     Rehab Potential  Good    Clinical Impairments Affecting Rehab Potential  co-morbidities    PT Frequency  2x / week    PT Duration  4 weeks    PT Treatment/Interventions  ADLs/Self Care Home Management;Canalith Repostioning;Gait training;Stair training;Functional mobility training;Therapeutic activities;Therapeutic exercise;Balance training;Neuromuscular re-education;Patient/family education;Vestibular;Passive range of motion    PT Next Visit Plan  Re-check for L BPPV if needed, continue HEP to challenge vestibular system (EC on compliant surface), high level balance, SLS    Consulted and Agree with Plan of Care  Patient       Patient  will benefit from skilled therapeutic intervention in order to improve the following deficits and impairments:  Decreased balance, Decreased strength, Dizziness, Impaired flexibility, Postural dysfunction, Impaired sensation  Visit Diagnosis: Unsteadiness on feet  Other disturbances of skin sensation  Dizziness and  giddiness     Problem List Patient Active Problem List   Diagnosis Date Noted  . Type 2 diabetes, controlled, with peripheral neuropathy ([place]) 11/01/2016  . OSA on CPAP 09/08/2015  . Family history of colonic polyps 07/28/2015  . Genetic testing 07/28/2015  . Family history of breast cancer in mother 02/04/2015  . Family history of ovarian cancer 02/04/2015  . Spondylolisthesis at L4-L5 level 05/06/2014  . Multinodular goiter 11/26/2011  . Gout 11/26/2011  . Hyperlipidemia 09/10/2010  . Overweight 09/10/2010  . TOBACCO ABUSE 09/10/2010  . HYPERTENSION, BENIGN 09/10/2010    Kaige Whistler Nilda Simmer PT, MPH  03/05/2018, 8:47 AM  [facility] [place], [place], [address] Phone: [phone]   Fax:  [phone]  Name: Madison Cowan MRN: [id number] Date of Birth: 11/05/1961

## 2018-03-06 ENCOUNTER — Other Ambulatory Visit: Payer: Self-pay | Admitting: Internal Medicine

## 2018-03-10 ENCOUNTER — Ambulatory Visit: Payer: BC Managed Care – PPO | Admitting: Rehabilitative and Restorative Service Providers"

## 2018-03-10 ENCOUNTER — Encounter: Payer: Self-pay | Admitting: Rehabilitative and Restorative Service Providers"

## 2018-03-10 DIAGNOSIS — R208 Other disturbances of skin sensation: Secondary | ICD-10-CM | POA: Diagnosis not present

## 2018-03-10 DIAGNOSIS — R2681 Unsteadiness on feet: Secondary | ICD-10-CM | POA: Diagnosis not present

## 2018-03-10 DIAGNOSIS — R42 Dizziness and giddiness: Secondary | ICD-10-CM | POA: Diagnosis not present

## 2018-03-10 NOTE — Patient Instructions (Addendum)
Achilles / Gastroc, Standing    Stand, right foot behind, heel on floor and turned slightly out, leg straight, forward leg bent. Move hips forward. Hold _30__ seconds. Repeat _3__ times per session. Do _1-2__ sessions per day.   Dorsiflexion: Resisted    Facing anchor, tubing around left foot, pull toward face.  Repeat __10__ times per set. Do __2-3__ sets per session. Do __1__ sessions per day.    Inversion: Resisted    Cross legs with right leg underneath, foot in tubing loop. Hold tubing around other foot to resist and turn foot in. Repeat ___10_ times per set. Do _2-3___ sets per session. Do __1__ sessions per day.    Eversion: Resisted    With right foot in tubing loop, hold tubing around other foot to resist and turn foot out. Repeat __10__ times per set. Do __2-3__ sets per session. Do __1__ sessions per day.    Toe Raise (Sitting)    Raise toes, keeping heels on floor. Repeat ____ times per set. Do ____ sets per session. Do ____ sessions per day.   Standing with one foot in front of the other 30 sec  Eyes open and eyes closed  3-4 reps  Standing at counter -  Heel to toe walking Backwards walking Side steps Cross over steps

## 2018-03-10 NOTE — Therapy (Signed)
[facility] [place], [place], [address] Phone: [phone]   Fax:  [phone]  Physical Therapy Treatment  Patient Details  Name: Erdine Hulen MRN: [id number] Date of Birth: 1961-07-24 Referring Provider: Ellouise Newer, MD   Encounter Date: 03/10/2018  PT End of Session - 03/10/18 0809    Visit Number  3    Number of Visits  13    Date for PT Re-Evaluation  05/02/18    PT Start Time  0805    PT Stop Time  0845    PT Time Calculation (min)  40 min    Activity Tolerance  Patient tolerated treatment well       Past Medical History:  Diagnosis Date  . Allergy    Zyrtec daily.  . Arthritis    DDD lumbar spine  . Asthma    Albuterol seasonally.  . Diabetes mellitus without complication ([place])   . Glaucoma   . Heart murmur   . Hyperlipidemia   . Hypertension   . Neuromuscular disorder ([place])    R third nerve palsy  . OSA (obstructive sleep apnea) 11/11/2006   CPAP  . Thyroid disease    Thyroid nodules s/p needle biopsy negative.      Past Surgical History:  Procedure Laterality Date  . CHOLECYSTECTOMY OPEN  1979    There were no vitals filed for this visit.  Subjective Assessment - 03/10/18 0815    Subjective  Not much time for exercises due to death of a close friend. Some discomfort in the LB and knees - not sure if that is related to change in activity level or exercises.     Currently in Pain?  Yes    Pain Score  2     Pain Location  Generalized    Pain Descriptors / Indicators  Aching;Dull    Pain Onset  More than a month ago    Pain Frequency  Constant                       [facility] Adult PT Treatment/Exercise - 03/10/18 0001      Knee/Hip Exercises: Stretches   Gastroc Stretch  Right;Left;2 reps;30 seconds      Knee/Hip Exercises: Aerobic   Nustep  L5 x 5 min       Knee/Hip Exercises: Standing   Lateral Step Up  Right;Left;2 sets;10 reps;Hand Hold: 2;Step Height:  4" heel tap without full weight shift-decreased step height    Forward Step Up  Right;Left;2 sets;10 reps;Hand Hold: 1;Hand Hold: 2;Step Height: 4" decreased step height due to knee discomfort     SLS  30 sec x 3 reps hand hold as needed for balance  eyes open and eyes closed     Other Standing Knee Exercises  heel toe stance eyes open eyes closed 30 sec x 2 each    Other Standing Knee Exercises  heel toe walking; backwards walking; side steps; cross overs 10 ft x 4 each       Ankle Exercises: Supine   T-Band  ; ever; inversioin green TB x 10 slowx 10 fast              PT Education - 03/10/18 0843    Education provided  Yes    Education Details  HEP     Person(s) Educated  Patient    Methods  Explanation;Demonstration;Tactile cues;Verbal cues;Handout    Comprehension  Verbalized understanding;Returned demonstration;Verbal cues required;Tactile cues  required       PT Short Term Goals - 03/10/18 0810      PT SHORT TERM GOAL #1   Title  Pt will initiate HEP in order to indicate decreased fall risk and improved functional mobility (Target Date: 04/02/18)    Time  4    Period  Weeks    Status  On-going      PT SHORT TERM GOAL #2   Title  Pt will report positional dizziness has resolved in order to indicate BPPV was cleared.     Time  4    Period  Weeks    Status  On-going      PT SHORT TERM GOAL #3   Title  Pt will ambulate 300' over indoor surfaces while scanning environment at mod I level in order to indicate safe community negotiation.      Time  4    Period  Weeks    Status  On-going      PT SHORT TERM GOAL #4   Title  Pt will improve FGA to 21/30 in order to indicate decreased fall risk.      Time  4    Period  Weeks    Status  On-going        PT Long Term Goals - 03/10/18 0809      PT LONG TERM GOAL #1   Title  Pt will be independent with final HEP in order to indicate decreased fall risk and improved functional mobility.  (Target Date: 05/02/18)    Time  8     Period  Weeks    Status  On-going      PT LONG TERM GOAL #2   Title  Pt will improve FGA to >/=24/30 in order to indicate decreased fall risk.      Time  8    Period  Weeks    Status  On-going      PT LONG TERM GOAL #3   Title  Pt will ambulate >1000' over varying outdoor surfaces (including grass) while scanning environment without overt LOB at mod I level in order to indicate safe community and leisure mobility.     Time  8    Period  Weeks    Status  On-going      PT LONG TERM GOAL #4   Title  Pt will tolerate standing on foam airex x 30 secs with feet together, EC with minimal postural sway.     Time  8    Period  Weeks    Status  On-going            Plan - 03/10/18 0810    Clinical Impression Statement  No dizziness. Not much time to work on exercises due to the death of a friend. Has had some discomfort in the back and knees - not sure if that is exercise or just different activity level in the past week.  Tolerated all exercises in the clinic well adding higher level balance activities.     Rehab Potential  Good    PT Frequency  2x / week    PT Duration  4 weeks    PT Treatment/Interventions  ADLs/Self Care Home Management;Canalith Repostioning;Gait training;Stair training;Functional mobility training;Therapeutic activities;Therapeutic exercise;Balance training;Neuromuscular re-education;Patient/family education;Vestibular;Passive range of motion    PT Next Visit Plan  Re-check for L BPPV if needed, continue HEP to challenge vestibular system (EC on compliant surface), high level balance, SLS  Patient will benefit from skilled therapeutic intervention in order to improve the following deficits and impairments:  Decreased balance, Decreased strength, Dizziness, Impaired flexibility, Postural dysfunction, Impaired sensation  Visit Diagnosis: Unsteadiness on feet  Other disturbances of skin sensation  Dizziness and giddiness     Problem List Patient  Active Problem List   Diagnosis Date Noted  . Type 2 diabetes, controlled, with peripheral neuropathy ([place]) 11/01/2016  . OSA on CPAP 09/08/2015  . Family history of colonic polyps 07/28/2015  . Genetic testing 07/28/2015  . Family history of breast cancer in mother 02/04/2015  . Family history of ovarian cancer 02/04/2015  . Spondylolisthesis at L4-L5 level 05/06/2014  . Multinodular goiter 11/26/2011  . Gout 11/26/2011  . Hyperlipidemia 09/10/2010  . Overweight 09/10/2010  . TOBACCO ABUSE 09/10/2010  . HYPERTENSION, BENIGN 09/10/2010    Oluwaseyi Tull Nilda Simmer PT, MPH  03/10/2018, 8:54 AM  [facility] [place], [place], [address] Phone: [phone]   Fax:  [phone]  Name: Tyrisha Benninger MRN: [id number] Date of Birth: 1961-08-24

## 2018-03-12 ENCOUNTER — Encounter: Payer: Self-pay | Admitting: Physical Therapy

## 2018-03-12 ENCOUNTER — Ambulatory Visit: Payer: BC Managed Care – PPO | Admitting: Physical Therapy

## 2018-03-12 DIAGNOSIS — R42 Dizziness and giddiness: Secondary | ICD-10-CM

## 2018-03-12 DIAGNOSIS — R2681 Unsteadiness on feet: Secondary | ICD-10-CM | POA: Diagnosis not present

## 2018-03-12 DIAGNOSIS — R208 Other disturbances of skin sensation: Secondary | ICD-10-CM

## 2018-03-12 NOTE — Therapy (Signed)
[facility] [place], [place], [address] Phone: [phone]   Fax:  [phone]  Physical Therapy Treatment  Patient Details  Name: Madison Cowan MRN: [id number] Date of Birth: [birth year] Referring Provider: Ellouise Newer, MD   Encounter Date: 03/12/2018  PT End of Session - 03/12/18 0812    Visit Number  4    Number of Visits  13    Date for PT Re-Evaluation  05/02/18    PT Start Time  0804    PT Stop Time  0844    PT Time Calculation (min)  40 min    Activity Tolerance  Patient tolerated treatment well    Behavior During Therapy  [facility] for tasks assessed/performed       Past Medical History:  Diagnosis Date  . Allergy    Zyrtec daily.  . Arthritis    DDD lumbar spine  . Asthma    Albuterol seasonally.  . Diabetes mellitus without complication ([place])   . Glaucoma   . Heart murmur   . Hyperlipidemia   . Hypertension   . Neuromuscular disorder ([place])    R third nerve palsy  . OSA (obstructive sleep apnea) 11/11/2006   CPAP  . Thyroid disease    Thyroid nodules s/p needle biopsy negative.      Past Surgical History:  Procedure Laterality Date  . CHOLECYSTECTOMY OPEN  1979    There were no vitals filed for this visit.  Subjective Assessment - 03/12/18 0814    Subjective  Pt reports she hasn't had much time for exercises.  Her knees and low back have been more achy over the last few days.  She reports she hasn't had any noticable changes since starting therapy.     Currently in Pain?  Yes    Pain Score  4     Pain Location  Generalized    Pain Descriptors / Indicators  Aching;Dull         [facility] PT Assessment - 03/12/18 0001      Assessment   Medical Diagnosis  Peripheral neuropathy    Referring Provider  Ellouise Newer, MD    Next MD Visit  06/2018      Functional Gait  Assessment   Gait assessed   Yes    Gait Level Surface  Walks 20 ft in less than 7 sec but greater than 5.5 sec, uses  assistive device, slower speed, mild gait deviations, or deviates 6-10 in outside of the 12 in walkway width. 6.58 sec    Change in Gait Speed  Able to change speed, demonstrates mild gait deviations, deviates 6-10 in outside of the 12 in walkway width, or no gait deviations, unable to achieve a major change in velocity, or uses a change in velocity, or uses an assistive device.    Gait with Horizontal Head Turns  Performs head turns smoothly with slight change in gait velocity (eg, minor disruption to smooth gait path), deviates 6-10 in outside 12 in walkway width, or uses an assistive device.    Gait with Vertical Head Turns  Performs task with slight change in gait velocity (eg, minor disruption to smooth gait path), deviates 6 - 10 in outside 12 in walkway width or uses assistive device    Gait and Pivot Turn  Pivot turns safely in greater than 3 sec and stops with no loss of balance, or pivot turns safely within 3 sec and stops with mild imbalance, requires small steps to  catch balance. small steps instead of pivot    Step Over Obstacle  Is able to step over one shoe box (4.5 in total height) without changing gait speed. No evidence of imbalance.    Gait with Narrow Base of Support  Ambulates 4-7 steps.    Gait with Eyes Closed  Walks 20 ft, no assistive devices, good speed, no evidence of imbalance, normal gait pattern, deviates no more than 6 in outside 12 in walkway width. Ambulates 20 ft in less than 7 sec.    Ambulating Backwards  Walks 20 ft, uses assistive device, slower speed, mild gait deviations, deviates 6-10 in outside 12 in walkway width.    Steps  Alternating feet, no rail.    Total Score  21        [facility] Adult PT Treatment/Exercise - 03/12/18 0001      Knee/Hip Exercises: Stretches   Passive Hamstring Stretch  Right;Left;2 reps;30 seconds    Quad Stretch  Right;Left;2 reps;30 seconds      Knee/Hip Exercises: Aerobic   Nustep  L4: 5 min      Knee/Hip Exercises: Standing    Other Standing Knee Exercises  tandem walking with arms crossed on chest x 12 ft with close SBA for safety;  tandem stance with occasional UE support to steady, multiple reps each leg; repeated with trial of Lt head turns.  semi tandem stance with feet on blue therapads, reciprocal arm swing, and head turns on cue - with occasional unilateral UE support to steady, SBA for safety.              PT Education - 03/12/18 0908    Education provided  Yes    Education Details  HEP     Person(s) Educated  Patient    Methods  Explanation;Handout;Verbal cues;Demonstration    Comprehension  Verbalized understanding;Returned demonstration       PT Short Term Goals - 03/12/18 0909      PT SHORT TERM GOAL #1   Title  Pt will initiate HEP in order to indicate decreased fall risk and improved functional mobility (Target Date: 04/02/18)    Time  4    Period  Weeks    Status  On-going      PT SHORT TERM GOAL #2   Title  Pt will report positional dizziness has resolved in order to indicate BPPV was cleared.     Time  4    Period  Weeks    Status  Achieved      PT SHORT TERM GOAL #3   Title  Pt will ambulate 300' over indoor surfaces while scanning environment at mod I level in order to indicate safe community negotiation.      Time  4    Period  Weeks    Status  On-going      PT SHORT TERM GOAL #4   Title  Pt will improve FGA to 21/30 in order to indicate decreased fall risk.      Time  4    Period  Weeks    Status  Achieved        PT Long Term Goals - 03/10/18 0809      PT LONG TERM GOAL #1   Title  Pt will be independent with final HEP in order to indicate decreased fall risk and improved functional mobility.  (Target Date: 05/02/18)    Time  8    Period  Weeks    Status  On-going  PT LONG TERM GOAL #2   Title  Pt will improve FGA to >/=24/30 in order to indicate decreased fall risk.      Time  8    Period  Weeks    Status  On-going      PT LONG TERM GOAL #3   Title  Pt  will ambulate >1000' over varying outdoor surfaces (including grass) while scanning environment without overt LOB at mod I level in order to indicate safe community and leisure mobility.     Time  8    Period  Weeks    Status  On-going      PT LONG TERM GOAL #4   Title  Pt will tolerate standing on foam airex x 30 secs with feet together, EC with minimal postural sway.     Time  8    Period  Weeks    Status  On-going            Plan - 03/12/18 0905    Clinical Impression Statement  Pt has reported no dizziness since treatment at neuro.  Her FGA has improved to 21 points; improved from 18 points during initial assessment. She continues to be challenged with narrow gait activities, along with L horiz head turns.  She is able to self correct with occasional UE support to steady, no CGA required. She has met STG #2 and 4 and is progressing towards remaining goals.      Rehab Potential  Good    PT Frequency  2x / week    PT Duration  4 weeks    PT Treatment/Interventions  ADLs/Self Care Home Management;Canalith Repostioning;Gait training;Stair training;Functional mobility training;Therapeutic activities;Therapeutic exercise;Balance training;Neuromuscular re-education;Patient/family education;Vestibular;Passive range of motion    PT Next Visit Plan  Balance exercises including tandem stance activities, head turns, single leg stance.  Continued focus on pt not locking knees in standing.     Consulted and Agree with Plan of Care  Patient       Patient will benefit from skilled therapeutic intervention in order to improve the following deficits and impairments:  Decreased balance, Decreased strength, Dizziness, Impaired flexibility, Postural dysfunction, Impaired sensation  Visit Diagnosis: Unsteadiness on feet  Other disturbances of skin sensation  Dizziness and giddiness     Problem List Patient Active Problem List   Diagnosis Date Noted  . Type 2 diabetes, controlled, with  peripheral neuropathy ([place]) 11/01/2016  . OSA on CPAP 09/08/2015  . Family history of colonic polyps 07/28/2015  . Genetic testing 07/28/2015  . Family history of breast cancer in mother 02/04/2015  . Family history of ovarian cancer 02/04/2015  . Spondylolisthesis at L4-L5 level 05/06/2014  . Multinodular goiter 11/26/2011  . Gout 11/26/2011  . Hyperlipidemia 09/10/2010  . Overweight 09/10/2010  . TOBACCO ABUSE 09/10/2010  . HYPERTENSION, BENIGN 09/10/2010   Kerin Perna, PTA 03/12/18 9:10 AM  [facility] [place], [place], [address] Phone: [phone]   Fax:  [phone]  Name: Madison Cowan MRN: [id number] Date of Birth: [birth year]

## 2018-03-16 ENCOUNTER — Other Ambulatory Visit: Payer: Self-pay | Admitting: Family Medicine

## 2018-03-17 ENCOUNTER — Ambulatory Visit: Payer: BC Managed Care – PPO | Admitting: Physical Therapy

## 2018-03-17 DIAGNOSIS — R2681 Unsteadiness on feet: Secondary | ICD-10-CM | POA: Diagnosis not present

## 2018-03-17 DIAGNOSIS — R42 Dizziness and giddiness: Secondary | ICD-10-CM | POA: Diagnosis not present

## 2018-03-17 DIAGNOSIS — R208 Other disturbances of skin sensation: Secondary | ICD-10-CM | POA: Diagnosis not present

## 2018-03-17 NOTE — Therapy (Signed)
[facility] [place], [place], [address] Phone: [phone]   Fax:  [phone]  Physical Therapy Treatment  Patient Details  Name: Madison Cowan MRN: [id number] Date of Birth: May 04, 1961 Referring Provider: Ellouise Newer, MD   Encounter Date: 03/17/2018  PT End of Session - 03/17/18 0933    Visit Number  5    Number of Visits  13    Date for PT Re-Evaluation  05/02/18    Authorization Type  [employer]    PT Start Time  0849    PT Stop Time  0933    PT Time Calculation (min)  44 min    Activity Tolerance  Patient tolerated treatment well    Behavior During Therapy  [facility] for tasks assessed/performed       Past Medical History:  Diagnosis Date  . Allergy    Zyrtec daily.  . Arthritis    DDD lumbar spine  . Asthma    Albuterol seasonally.  . Diabetes mellitus without complication ([place])   . Glaucoma   . Heart murmur   . Hyperlipidemia   . Hypertension   . Neuromuscular disorder ([place])    R third nerve palsy  . OSA (obstructive sleep apnea) 11/11/2006   CPAP  . Thyroid disease    Thyroid nodules s/p needle biopsy negative.      Past Surgical History:  Procedure Laterality Date  . CHOLECYSTECTOMY OPEN  1979    There were no vitals filed for this visit.  Subjective Assessment - 03/17/18 0850    Subjective  Pt reports she has been groggy over last few days after taking Lyrica.  Tension in her low back has improved since last visit.  She believes her balance is a "smidgy better".     Patient Stated Goals  "I'd like to have better balance. I don't want to start falling."     Currently in Pain?  Yes    Pain Score  2     Pain Location  Generalized    Pain Descriptors / Indicators  Aching    Aggravating Factors   prolonged sitting    Pain Relieving Factors  laying on floor         [facility] PT Assessment - 03/17/18 0001      Assessment   Medical Diagnosis  Peripheral neuropathy    Referring Provider   Ellouise Newer, MD    Next MD Visit  06/2018       [facility] Adult PT Treatment/Exercise - 03/17/18 0001      Knee/Hip Exercises: Stretches   Passive Hamstring Stretch  60 seconds;2 reps;Right;Left    Quad Stretch  60 seconds;Right;Left;2 reps    Gastroc Stretch  Right;Left;2 reps;30 seconds    Other Knee/Hip Stretches  plantar fascia stretch x 30 sec x 2 reps  (after SLS exercise)      Knee/Hip Exercises: Aerobic   Nustep  -- pt declined.       Knee/Hip Exercises: Standing   Wall Squat  1 set;10 reps    SLS  SLS on blue Airex pad x 20 sec, with occasional UE support to steady.   SLS on mini tramp x 30 sec each .     Other Standing Knee Exercises  feet together on Airx mat, eyes closed with UE support every 3 seconds to steady.  Repeated on flat ground (UE support needed within 5 sec).  Marching on mini tramp x 30 sec (without UE support).  Other Standing Knee Exercises  Standing on 1/2 foam roller (flat side up) x 30 sec x 2 reps (occasional UE support to steady);  tandem stance on 1/2 foam roller (flat side down) x 30 sec each foot forward.    Warrior II pose x 30 sec, including small pulses last 10 sec x 1 rep each side.    Heel/toe walk on line x 8 ft forward/backward x 2 reps             PT Education - 03/17/18 1323    Education provided  Yes    Education Details  HEP    Person(s) Educated  Patient    Methods  Explanation;Handout;Demonstration;Verbal cues    Comprehension  Verbalized understanding;Returned demonstration       PT Short Term Goals - 03/12/18 0909      PT SHORT TERM GOAL #1   Title  Pt will initiate HEP in order to indicate decreased fall risk and improved functional mobility (Target Date: 04/02/18)    Time  4    Period  Weeks    Status  On-going      PT SHORT TERM GOAL #2   Title  Pt will report positional dizziness has resolved in order to indicate BPPV was cleared.     Time  4    Period  Weeks    Status  Achieved      PT SHORT TERM GOAL #3    Title  Pt will ambulate 300' over indoor surfaces while scanning environment at mod I level in order to indicate safe community negotiation.      Time  4    Period  Weeks    Status  On-going      PT SHORT TERM GOAL #4   Title  Pt will improve FGA to 21/30 in order to indicate decreased fall risk.      Time  4    Period  Weeks    Status  Achieved        PT Long Term Goals - 03/10/18 0809      PT LONG TERM GOAL #1   Title  Pt will be independent with final HEP in order to indicate decreased fall risk and improved functional mobility.  (Target Date: 05/02/18)    Time  8    Period  Weeks    Status  On-going      PT LONG TERM GOAL #2   Title  Pt will improve FGA to >/=24/30 in order to indicate decreased fall risk.      Time  8    Period  Weeks    Status  On-going      PT LONG TERM GOAL #3   Title  Pt will ambulate >1000' over varying outdoor surfaces (including grass) while scanning environment without overt LOB at mod I level in order to indicate safe community and leisure mobility.     Time  8    Period  Weeks    Status  On-going      PT LONG TERM GOAL #4   Title  Pt will tolerate standing on foam airex x 30 secs with feet together, EC with minimal postural sway.     Time  8    Period  Weeks    Status  On-going            Plan - 03/17/18 1325    Clinical Impression Statement  Pt had difficulty with closed eye balance exercise, as well  as tandem stance on 1/2 foam roller.  Added strengthening exercises for LE into HEP.  Pt progressing gradually towards established goals.     Rehab Potential  Good    PT Frequency  2x / week    PT Duration  4 weeks    PT Treatment/Interventions  ADLs/Self Care Home Management;Canalith Repostioning;Gait training;Stair training;Functional mobility training;Therapeutic activities;Therapeutic exercise;Balance training;Neuromuscular re-education;Patient/family education;Vestibular;Passive range of motion    PT Next Visit Plan  spoke to  supervising PT; will change freq to 1x/wk (high co-pay).  Work on gait with scanning environment and non-compliant surfaces.      Consulted and Agree with Plan of Care  Patient       Patient will benefit from skilled therapeutic intervention in order to improve the following deficits and impairments:  Decreased balance, Decreased strength, Dizziness, Impaired flexibility, Postural dysfunction, Impaired sensation  Visit Diagnosis: Unsteadiness on feet  Other disturbances of skin sensation  Dizziness and giddiness     Problem List Patient Active Problem List   Diagnosis Date Noted  . Type 2 diabetes, controlled, with peripheral neuropathy ([place]) 11/01/2016  . OSA on CPAP 09/08/2015  . Family history of colonic polyps 07/28/2015  . Genetic testing 07/28/2015  . Family history of breast cancer in mother 02/04/2015  . Family history of ovarian cancer 02/04/2015  . Spondylolisthesis at L4-L5 level 05/06/2014  . Multinodular goiter 11/26/2011  . Gout 11/26/2011  . Hyperlipidemia 09/10/2010  . Overweight 09/10/2010  . TOBACCO ABUSE 09/10/2010  . HYPERTENSION, BENIGN 09/10/2010   Kerin Perna, PTA 03/17/18 1:31 PM  [place], [place], [address] Phone: [phone]   Fax:  [phone]  Name: Madison Cowan MRN: [id number] Date of Birth: 1961-02-15

## 2018-03-17 NOTE — Patient Instructions (Signed)
Warrior II    In wide stance, arms extended out, rotate right leg out 90, left leg in 20. Bend right leg 90 in line with foot. Keep left foot flat, hips square to front. Turn head right. Hold for _30 seconds.  Start small pulses for last 10 seconds.  Repeat on other side. NOTE: Keep bent knee behind line of toes.   Strengthening: Wall Slide    Leaning on wall, slowly lower buttocks until thighs are parallel to floor. Hold _1-5___ seconds. Tighten thigh muscles and return. Repeat _10___ times per set.

## 2018-03-19 ENCOUNTER — Encounter: Payer: BC Managed Care – PPO | Admitting: Rehabilitative and Restorative Service Providers"

## 2018-03-24 ENCOUNTER — Encounter: Payer: BC Managed Care – PPO | Admitting: Physical Therapy

## 2018-03-26 ENCOUNTER — Ambulatory Visit: Payer: BC Managed Care – PPO | Admitting: Physical Therapy

## 2018-03-26 DIAGNOSIS — R208 Other disturbances of skin sensation: Secondary | ICD-10-CM

## 2018-03-26 DIAGNOSIS — R2681 Unsteadiness on feet: Secondary | ICD-10-CM | POA: Diagnosis not present

## 2018-03-26 DIAGNOSIS — R42 Dizziness and giddiness: Secondary | ICD-10-CM | POA: Diagnosis not present

## 2018-03-26 NOTE — Therapy (Signed)
[place] [place], [place], [address] Phone: [phone]   Fax:  [phone]  Physical Therapy Treatment  Patient Details  Name: Madison Cowan MRN: [id number] Date of Birth: 03-06-1961 Referring Provider: Ellouise Newer, MD   Encounter Date: 03/26/2018  PT End of Session - 03/26/18 0839    Visit Number  6    Number of Visits  13    Date for PT Re-Evaluation  05/02/18    Authorization Type  [employer]    PT Start Time  0806    PT Stop Time  0848    PT Time Calculation (min)  42 min       Past Medical History:  Diagnosis Date  . Allergy    Zyrtec daily.  . Arthritis    DDD lumbar spine  . Asthma    Albuterol seasonally.  . Diabetes mellitus without complication ([place])   . Glaucoma   . Heart murmur   . Hyperlipidemia   . Hypertension   . Neuromuscular disorder ([place])    R third nerve palsy  . OSA (obstructive sleep apnea) 11/11/2006   CPAP  . Thyroid disease    Thyroid nodules s/p needle biopsy negative.      Past Surgical History:  Procedure Laterality Date  . CHOLECYSTECTOMY OPEN  1979    There were no vitals filed for this visit.  Subjective Assessment - 03/26/18 0807    Subjective  "I still fear falling when I walk a long distance".   She is trying to create a conscience schedule during day to include exercises into day.      Patient Stated Goals  "I'd like to have better balance. I don't want to start falling."     Currently in Pain?  Yes    Pain Score  2     Pain Location  Back    Pain Descriptors / Indicators  Aching    Aggravating Factors   prolonged sitting    Pain Relieving Factors   laying on floor and stretching         [facility] PT Assessment - 03/26/18 0001      Assessment   Medical Diagnosis  Peripheral neuropathy    Referring Provider  Madison Newer, MD    Next MD Visit  06/2018       [facility] Adult PT Treatment/Exercise - 03/26/18 0001      Knee/Hip Exercises: Stretches   Passive Hamstring Stretch  Right;Left;2 reps 45 sec, supine with strap    Gastroc Stretch  Right;Left;2 reps;30 seconds    Other Knee/Hip Stretches  LTR with arms in T x 30 sec x 2 reps each side.       Knee/Hip Exercises: Aerobic   Other Aerobic  walking laps around gym in between exercises due decrease "burning" in feet.       Knee/Hip Exercises: Standing   Wall Squat  1 set;10 reps    Rebounder  marching in place, repeated with scanning environment - up/down/ L/R (LOB with looking down)  x 2 min;  SLS on rebounder x 30 sec each - occasional UE support to steady    Other Standing Knee Exercises  feet together, EC x 7 sec, x 30 sec (no UE support).   Standing feet together on AirEx pad EO x 30 sec, with horiz head turns x 30sec; repeated with EC x 30 sec.     Other Standing Knee Exercises  Standing on 1/2 foam roller (flat side  down) x 30, repeated flat side down x 30 sec, no support needed;  tandem stance on 1/2 foam roller (flat side down) x 30 sec each foot forward.    Warrior II pose x 30 sec, including small pulses last 10 sec x 1 rep each side.    Heel/toe walk on line x 8 ft forward/backward x 2 reps.  Toe taps with Lt foot to cup on 3" step x 10 reps while in Rt SLS (challenging.).                PT Short Term Goals - 03/26/18 2355      PT SHORT TERM GOAL #1   Title  Pt will initiate HEP in order to indicate decreased fall risk and improved functional mobility (Target Date: 04/02/18)    Time  4    Period  Weeks    Status  Achieved      PT SHORT TERM GOAL #2   Title  Pt will report positional dizziness has resolved in order to indicate BPPV was cleared.     Time  4    Period  Weeks    Status  Achieved      PT SHORT TERM GOAL #3   Title  Pt will ambulate 300' over indoor surfaces while scanning environment at mod I level in order to indicate safe community negotiation.      Time  4    Period  Weeks    Status  On-going      PT SHORT TERM GOAL #4   Title  Pt will  improve FGA to 21/30 in order to indicate decreased fall risk.      Time  4    Period  Weeks    Status  Achieved        PT Long Term Goals - 03/26/18 0810      PT LONG TERM GOAL #1   Title  Pt will be independent with final HEP in order to indicate decreased fall risk and improved functional mobility.  (Target Date: 05/02/18)    Time  8    Period  Weeks    Status  On-going      PT LONG TERM GOAL #2   Title  Pt will improve FGA to >/=24/30 in order to indicate decreased fall risk.      Time  8    Period  Weeks    Status  On-going      PT LONG TERM GOAL #3   Title  Pt will ambulate >1000' over varying outdoor surfaces (including grass) while scanning environment without overt LOB at mod I level in order to indicate safe community and leisure mobility.     Time  8    Period  Weeks    Status  On-going      PT LONG TERM GOAL #4   Title  Pt will tolerate standing on foam airex x 30 secs with feet together, EC with minimal postural sway.     Time  8    Period  Weeks    Status  On-going            Plan - 03/26/18 7322    Clinical Impression Statement  Pt demonstrated improved balance with narrow stance eyes closed.  She was able to complete 30 sec, EC, on airex pad; met LTG#4.   She reported increased fatigue in LE with activities on rebounder. Rt SLS activities are most challenging as well as head motions (  down/up) with gait.  Overall, balance is gradually improving.     Rehab Potential  Good    PT Frequency  2x / week    PT Duration  4 weeks    PT Treatment/Interventions  ADLs/Self Care Home Management;Canalith Repostioning;Gait training;Stair training;Functional mobility training;Therapeutic activities;Therapeutic exercise;Balance training;Neuromuscular re-education;Patient/family education;Vestibular;Passive range of motion    PT Next Visit Plan   will continue freq at 1x/wk (high co-pay).  Work on gait with scanning environment and balance on non-compliant surfaces.       Consulted and Agree with Plan of Care  Patient       Patient will benefit from skilled therapeutic intervention in order to improve the following deficits and impairments:  Decreased balance, Decreased strength, Dizziness, Impaired flexibility, Postural dysfunction, Impaired sensation  Visit Diagnosis: Unsteadiness on feet  Other disturbances of skin sensation  Dizziness and giddiness     Problem List Patient Active Problem List   Diagnosis Date Noted  . Type 2 diabetes, controlled, with peripheral neuropathy ([place]) 11/01/2016  . OSA on CPAP 09/08/2015  . Family history of colonic polyps 07/28/2015  . Genetic testing 07/28/2015  . Family history of breast cancer in mother 02/04/2015  . Family history of ovarian cancer 02/04/2015  . Spondylolisthesis at L4-L5 level 05/06/2014  . Multinodular goiter 11/26/2011  . Gout 11/26/2011  . Hyperlipidemia 09/10/2010  . Overweight 09/10/2010  . TOBACCO ABUSE 09/10/2010  . HYPERTENSION, BENIGN 09/10/2010   Kerin Perna, PTA 03/26/18 1:03 PM  [place], [place], [address] Phone: [phone]   Fax:  [phone]  Name: Madison Cowan MRN: [id number] Date of Birth: 1961-08-17

## 2018-03-26 NOTE — Patient Instructions (Signed)
*    Toe taps to cup on stair or ground.  Have hands near railing or counter.   Work on mostly standing on your right leg with slow, controlled movement.   - don't lock your knee.             [facility] [place], [place] [address]  [phone] (office) [phone] (fax)

## 2018-04-01 ENCOUNTER — Ambulatory Visit: Payer: BC Managed Care – PPO | Admitting: Physical Therapy

## 2018-04-01 ENCOUNTER — Encounter: Payer: Self-pay | Admitting: Physical Therapy

## 2018-04-01 DIAGNOSIS — R42 Dizziness and giddiness: Secondary | ICD-10-CM

## 2018-04-01 DIAGNOSIS — R2681 Unsteadiness on feet: Secondary | ICD-10-CM

## 2018-04-01 DIAGNOSIS — R208 Other disturbances of skin sensation: Secondary | ICD-10-CM

## 2018-04-01 NOTE — Therapy (Signed)
[place], [place], [address] Phone: [phone]   Fax:  [phone]  Physical Therapy Treatment  Patient Details  Name: Madison Cowan MRN: [id number] Date of Birth: 09-06-1961 Referring Provider: Ellouise Newer, MD   Encounter Date: 04/01/2018  PT End of Session - 04/01/18 0809    Visit Number  7    Number of Visits  13    Date for PT Re-Evaluation  05/02/18    PT Start Time  0803    PT Stop Time  0851    PT Time Calculation (min)  48 min    Activity Tolerance  Patient tolerated treatment well;No increased pain    Behavior During Therapy  WFL for tasks assessed/performed       Past Medical History:  Diagnosis Date  . Allergy    Zyrtec daily.  . Arthritis    DDD lumbar spine  . Asthma    Albuterol seasonally.  . Diabetes mellitus without complication ([place])   . Glaucoma   . Heart murmur   . Hyperlipidemia   . Hypertension   . Neuromuscular disorder ([place])    R third nerve palsy  . OSA (obstructive sleep apnea) 11/11/2006   CPAP  . Thyroid disease    Thyroid nodules s/p needle biopsy negative.      Past Surgical History:  Procedure Laterality Date  . CHOLECYSTECTOMY OPEN  1979    There were no vitals filed for this visit.  Subjective Assessment - 04/01/18 0804    Subjective  Pt reports she has had no falls since last visit.  "My biggest challenge is to make the time to use the tools I've been given."  She reports she feels she is not as unsteady.  She has been trying hard to not lock knees, and has noticed her Rt knee is sore and tight.     Currently in Pain?  Yes    Pain Score  3     Pain Location  Generalized knees / back predominate    Pain Orientation  Right;Left    Pain Descriptors / Indicators  Dull    Aggravating Factors   some standing balance exercises,  prolonged sitting    Pain Relieving Factors  laying on floor and stretching; medicine         [facility] PT Assessment -  04/01/18 0001      Assessment   Medical Diagnosis  Peripheral neuropathy    Referring Provider  Ellouise Newer, MD    Next MD Visit  06/2018      Functional Gait  Assessment   Gait Level Surface  Walks 20 ft in less than 5.5 sec, no assistive devices, good speed, no evidence for imbalance, normal gait pattern, deviates no more than 6 in outside of the 12 in walkway width. 5.35 sec    Change in Gait Speed  Able to change speed, demonstrates mild gait deviations, deviates 6-10 in outside of the 12 in walkway width, or no gait deviations, unable to achieve a major change in velocity, or uses a change in velocity, or uses an assistive device.    Gait with Horizontal Head Turns  Performs head turns smoothly with slight change in gait velocity (eg, minor disruption to smooth gait path), deviates 6-10 in outside 12 in walkway width, or uses an assistive device. mild gait deviation with Lt head turn    Gait with Vertical Head Turns  Performs task with slight change in gait velocity (  eg, minor disruption to smooth gait path), deviates 6 - 10 in outside 12 in walkway width or uses assistive device gait deviation with looking up    Gait and Pivot Turn  Pivot turns safely within 3 sec and stops quickly with no loss of balance.    Step Over Obstacle  Is able to step over 2 stacked shoe boxes taped together (9 in total height) without changing gait speed. No evidence of imbalance.    Gait with Narrow Base of Support  Ambulates 7-9 steps.    Gait with Eyes Closed  Walks 20 ft, uses assistive device, slower speed, mild gait deviations, deviates 6-10 in outside 12 in walkway width. Ambulates 20 ft in less than 9 sec but greater than 7 sec. 8 seconds    Ambulating Backwards  Walks 20 ft, no assistive devices, good speed, no evidence for imbalance, normal gait    Steps  Alternating feet, no rail.    Total Score  25        [facility] Adult PT Treatment/Exercise - 04/01/18 0001      Knee/Hip Exercises: Stretches    Passive Hamstring Stretch  Right;Left;2 reps;60 seconds    Quad Stretch  Right;Left;2 reps;60 seconds    Piriformis Stretch  Right;Left;2 reps;30 seconds    Gastroc Stretch  Right;Left;2 reps;30 seconds      Knee/Hip Exercises: Aerobic   Other Aerobic  walking laps around gym in between exercises due decrease "burning" in feet.       Knee/Hip Exercises: Standing   Wall Squat  1 set;10 reps;3 seconds with ball squeeze    Rebounder  marching in place on mini-tramp with scanning environment - up/down/ L/R (occasional UE to steady) x 1 min;  SLS on 3" pad x 30 sec each with occasional UE support to steady    Other Standing Knee Exercises  feet together, EC x 30 sec (no UE support).   Standing feet together on AirEx pad EO x 30 sec, with horiz head turns x 30sec    Other Standing Knee Exercises  Standing on 1/2 foam roller (flat side up) x 30 sec x 2 reps (occasional UE support to steady);  tandem stance on 1/2 foam roller (flat side down) x 30 sec each foot forward.    Warrior II pose x 30 sec, including small pulses last 10 sec x 1 rep each side.    Heel/toe walk on line x 89f                PT Short Term Goals - 03/26/18 05885     PT SHORT TERM GOAL #1   Title  Pt will initiate HEP in order to indicate decreased fall risk and improved functional mobility (Target Date: 04/02/18)    Time  4    Period  Weeks    Status  Achieved      PT SHORT TERM GOAL #2   Title  Pt will report positional dizziness has resolved in order to indicate BPPV was cleared.     Time  4    Period  Weeks    Status  Achieved      PT SHORT TERM GOAL #3   Title  Pt will ambulate 300' over indoor surfaces while scanning environment at mod I level in order to indicate safe community negotiation.      Time  4    Period  Weeks    Status  On-going      PT SHORT  TERM GOAL #4   Title  Pt will improve FGA to 21/30 in order to indicate decreased fall risk.      Time  4    Period  Weeks    Status  Achieved         PT Long Term Goals - 04/01/18 1017      PT LONG TERM GOAL #1   Title  Pt will be independent with final HEP in order to indicate decreased fall risk and improved functional mobility.  (Target Date: 05/02/18)    Time  8    Period  Weeks    Status  On-going      PT LONG TERM GOAL #2   Title  Pt will improve FGA to >/=24/30 in order to indicate decreased fall risk.      Time  8    Period  Weeks    Status  Achieved      PT LONG TERM GOAL #3   Title  Pt will ambulate >1000' over varying outdoor surfaces (including grass) while scanning environment without overt LOB at mod I level in order to indicate safe community and leisure mobility.     Time  8    Period  Weeks    Status  On-going      PT LONG TERM GOAL #4   Title  Pt will tolerate standing on foam airex x 30 secs with feet together, EC with minimal postural sway.     Time  8    Period  Weeks    Status  Achieved            Plan - 04/01/18 5102    Clinical Impression Statement  Pt scored 25 on FGA today; has met LTG#2.  She continues to have balance deficits with horiz and vertical head turns with gait.  She tolerated all exercises well, without LOB.  Pt agreeable to hold therapy for 2 wks while she works on [employer].      Rehab Potential  Good    PT Frequency  2x / week    PT Duration  4 weeks    PT Treatment/Interventions  ADLs/Self Care Home Management;Canalith Repostioning;Gait training;Stair training;Functional mobility training;Therapeutic activities;Therapeutic exercise;Balance training;Neuromuscular re-education;Patient/family education;Vestibular;Passive range of motion    PT Next Visit Plan  assess progress and goals, and readiness to d/c.  Finalize HEP.     Consulted and Agree with Plan of Care  Patient       Patient will benefit from skilled therapeutic intervention in order to improve the following deficits and impairments:  Decreased balance, Decreased strength, Dizziness, Impaired flexibility, Postural  dysfunction, Impaired sensation  Visit Diagnosis: Unsteadiness on feet  Other disturbances of skin sensation  Dizziness and giddiness     Problem List Patient Active Problem List   Diagnosis Date Noted  . Type 2 diabetes, controlled, with peripheral neuropathy ([place]) 11/01/2016  . OSA on CPAP 09/08/2015  . Family history of colonic polyps 07/28/2015  . Genetic testing 07/28/2015  . Family history of breast cancer in mother 02/04/2015  . Family history of ovarian cancer 02/04/2015  . Spondylolisthesis at L4-L5 level 05/06/2014  . Multinodular goiter 11/26/2011  . Gout 11/26/2011  . Hyperlipidemia 09/10/2010  . Overweight 09/10/2010  . TOBACCO ABUSE 09/10/2010  . HYPERTENSION, BENIGN 09/10/2010   Kerin Perna, PTA 04/01/18 9:01 AM  [facility] [place], [place], [address] Phone: [phone]   Fax:  [phone]  Name: Madison Cowan  MRN: [id number] Date of Birth: December 23, 1960

## 2018-04-06 ENCOUNTER — Encounter: Payer: Self-pay | Admitting: Family Medicine

## 2018-04-08 ENCOUNTER — Other Ambulatory Visit: Payer: Self-pay | Admitting: Internal Medicine

## 2018-04-10 ENCOUNTER — Encounter: Payer: Self-pay | Admitting: Internal Medicine

## 2018-04-10 ENCOUNTER — Ambulatory Visit: Payer: BC Managed Care – PPO | Admitting: Internal Medicine

## 2018-04-10 VITALS — BP 128/72 | HR 93 | Ht 63.0 in | Wt 208.6 lb

## 2018-04-10 DIAGNOSIS — E042 Nontoxic multinodular goiter: Secondary | ICD-10-CM | POA: Diagnosis not present

## 2018-04-10 DIAGNOSIS — E7800 Pure hypercholesterolemia, unspecified: Secondary | ICD-10-CM | POA: Diagnosis not present

## 2018-04-10 DIAGNOSIS — E1142 Type 2 diabetes mellitus with diabetic polyneuropathy: Secondary | ICD-10-CM | POA: Diagnosis not present

## 2018-04-10 LAB — POCT GLYCOSYLATED HEMOGLOBIN (HGB A1C): HEMOGLOBIN A1C: 6.6 % — AB (ref 4.0–5.6)

## 2018-04-10 NOTE — Progress Notes (Signed)
Patient ID: Madison Cowan, female   DOB: 17-Jul-1961, 57 y.o.   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 Last visit 5.5 months ago  She has more joint pain since last visit >> attended a MELT method class >> feels better.  She will end work in another week >> summer vacation.   DM2: Last hemoglobin A1c was: Lab Results  Component Value Date   HGBA1C 6.8 10/28/2017   HGBA1C 6.2 07/29/2017   HGBA1C 6.4 04/23/2017   She is on: - Metformin 1000 mg 2x a day with meals. - Jardiance 25 mg daily before b'fast. - started 37/1062 - Trulicity 1.5 mcg weekly - Levemir 40 units in a.m. and 60 units at bedtime (pens) - Novolog (vials): - 20 units for a smaller meal - 25 units for a larger meal - (28-30 units for a very large meal)- used this only few times since last visit  Pt checks her sugars 2-4x a day -per review of her log: - am:  129-161 >> 121-172, 179 >> 120-182, 201, 200s when off Jardiace - 2h after b'fast: 150 >> 177, 208 >>> n/c - before lunch: 191 >> n/c >> 186, 190 >> 121-170 (late b'fast) - 2h after lunch: 283 (buffet) >> 72, 161, 195 >> 173, 210 - both off Jardiance - before dinner:133 >> 89-161, 195 >> 89-178, 197 - pocorn - 2h after dinner: 185, 183, 206 >> 113-183, 207, 283 >> 131-156, 220 - bedtime:72 >> 150, 161 >> n/c - nighttime: n/c >> 133 >> n/c Lowest sugar was 72 >> 89; she has hypoglycemia awareness in the 70s. Highest sugar was  283 >> 200s.  Glucometer: One Touch ultra  Pt's meals are: - Breakfast: protein shakes (6g carbs); McDonads  - Lunch: may skip; salads cheeseburger; school cafeteria food - Dinner: fast food; porc chops + collard greens + salad; hamburger; McNuggets - Snacks: sometimes; sweets, nuts, chips She does not have organized mealtimes.  -+ Mild CKD, last  BUN/creatinine:  Lab Results  Component Value Date   BUN 20 10/15/2017   BUN 17 03/18/2017   CREATININE 0.92 10/15/2017   CREATININE 1.07 (H) 03/18/2017   -+ HL. Last set of lipids: Lab Results  Component Value Date   CHOL 105 10/15/2017   HDL 32 (L) 10/15/2017   LDLCALC 46 10/15/2017   TRIG 137 10/15/2017   CHOLHDL 3.3 10/15/2017  On Crestor 10. - last eye exam was in 01/2017: No DR. + glaucoma suspect. -+ Numbness and tingling in her feet, and also burning.  She is on B complex and alpha lipoic acid.  She is also on Lyrica  She also has a history of HTN.  Thyroid nodules:  Reviewed her imaging study reports: Thyroid ultrasound (01/03/2016): Dominant nodule is a right mid lobe hypoechoic nodule of 2.1 x 0.8 x 1.2 cm, and previously measured up to 2.3 cm. There is one left thyroid nodule measuring 6 mm and several other tiny scattered nodules.  She had a benign biopsy of the dominant nodule.  Thyroid ultrasound (04/29/2017): Previously biopsied right nodule is unchanged.  She has 2 additional subcentimeter thyroid nodules, that did not require follow-up.  Also, a hypoechoic lymph node on the right side of the neck, 1.9 cm x 0.5 cm.  Neck CT (11/19/2017): Thyroid: Small thyroid nodules as seen at ultrasound.  No worrisome finding by CT. Largest nodule on the right measures 1.7 cm. Lymph nodes: No enlarged or low-density nodes on either side of the neck. Symmetric normal appearing lymph nodes by size criteria.  Pt denies: - feeling nodules in neck - hoarseness - dysphagia - choking - SOB with lying down  ROS: Constitutional: no weight gain/no weight loss, no fatigue, no subjective hyperthermia, no subjective hypothermia Eyes: no blurry vision, no xerophthalmia ENT: no sore throat, + see HPI Cardiovascular: no CP/no SOB/no palpitations/no leg swelling Respiratory: no cough/no SOB/no wheezing Gastrointestinal: no N/no V/no D/no C/no acid reflux Musculoskeletal: + muscle  aches/+ joint aches Skin: no rashes, no hair loss Neurological: no tremors/+ numbness/+ tingling/no dizziness  I reviewed pt's medications, allergies, PMH, social hx, family hx, and changes were documented in the history of present illness. Otherwise, unchanged from my initial visit note.   Past Medical History:  Diagnosis Date  . Allergy    Zyrtec daily.  . Arthritis    DDD lumbar spine  . Asthma    Albuterol seasonally.  . Diabetes mellitus without complication ([place])   . Glaucoma   . Heart murmur   . Hyperlipidemia   . Hypertension   . Neuromuscular disorder ([place])    R third nerve palsy  . OSA (obstructive sleep apnea) 11/11/2006   CPAP  . Thyroid disease    Thyroid nodules s/p needle biopsy negative.    Also, spondylolisthesis L4-L5  Past Surgical History:  Procedure Laterality Date  . CHOLECYSTECTOMY OPEN  1979   Social History   Social History  . Marital status: Divorced    Spouse name: N/A  . 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 Smoking status: Former Smoker    Packs/day: 0.05    Years: 18.00    Types: Cigarettes    Quit date: 02/2016  . Smokeless tobacco: Never Used  . Alcohol use 0.0 oz/week     Comment: maybe 1-2 drinks per yr  . Drug use: No  . Sexual activity: No   Other Topics Concern  . 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  [place] work.      Tobacco: 1/2 pdd x 20 years.      Alcohol:  None      Drugs: none       Education: [employer].       Exercise: No.      Seatbelt: 100%      Guns: unloaded.      Sexually active: none; total partners = 5.  Last STD screen 2006.     Current Outpatient Medications on File Prior to Visit  Medication Sig Dispense Refill  .  Ascorbic Acid (VITAMIN C) 100 MG tablet Take 100 mg by mouth daily.    Marland Kitchen aspirin 325 MG EC tablet Take 325 mg by mouth daily.    Marland Kitchen b complex vitamins tablet Take 1 tablet by mouth daily.    . BD INSULIN SYRINGE U/F 31G X 5/16" 1 ML MISC USE TO GIVE 10 TO 15 UNITS 3 TIMES DAILY BEFORE MEALS 100 each 0  . co-enzyme Q-10 30 MG capsule Take  30 mg by mouth 3 (three) times daily.     Marland Kitchen glucose blood (ONE TOUCH ULTRA TEST) test strip TEST TWICE DAILY 100 each 2  . Insulin Detemir (LEVEMIR) 100 UNIT/ML Pen Inject under skin 100 units a day as advised 30 mL 5  . Insulin Pen Needle (BD PEN NEEDLE NANO U/F) 32G X 4 MM MISC USE AS DIRECTED; inject insulin five times daily 100 each 3  . JARDIANCE 25 MG TABS tablet TAKE ONE TABLET BY MOUTH DAILY 30 tablet 3  . LEVEMIR FLEXTOUCH 100 UNIT/ML Pen INJECT 60 UNITS UNDER THE SKIN TWO TIMES A DAY 30 pen 4  . levocetirizine (XYZAL) 5 MG tablet Take 5 mg by mouth every evening.    Marland Kitchen losartan-hydrochlorothiazide (HYZAAR) 100-12.5 MG tablet TAKE ONE TABLET BY MOUTH DAILY 90 tablet 0  . metFORMIN (GLUCOPHAGE) 1000 MG tablet TAKE ONE TABLET BY MOUTH TWICE A DAY 180 tablet 2  . Multiple Vitamins-Minerals (MULTIVITAMIN WITH MINERALS) tablet Take 1 tablet by mouth daily.    Marland Kitchen NOVOLOG 100 UNIT/ML injection INJECT 20 TO 25 UNITS INTO THE SKIN THREE TIMES DAILY WITH MEALS 40 mL 4  . pregabalin (LYRICA) 25 MG capsule Take 1-2 capsules (25-50 mg total) by mouth at bedtime. 60 capsule 5  . rosuvastatin (CRESTOR) 10 MG tablet TAKE ONE TABLET BY MOUTH DAILY 90 tablet 3  . TRULICITY 1.5 XB/1.4NW SOPN INJECT 1.5 MG UNDER THE SKIN ONCE WEEKLY AS DIRECTED. 2 pen 0  . TRULICITY 1.5 GN/5.6OZ SOPN INJECT 1.5 MG UNDER THE SKIN ONCE WEEKLY. 2 pen 2   No current facility-administered medications on file prior to visit.    No Known Allergies Family History  Problem Relation Age of Onset  . Diabetes Mother   . Hypertension Mother   . Ovarian cancer Mother 19  . Breast cancer Mother 20   . Cancer Mother 39       ovarian cancer age 74; breast cancer age 29  . Diabetes Father   . Heart disease Father 61       CAD/CABG; AAA; CHF  . Hyperlipidemia Father   . Hypertension Father   . Prostate cancer Father        dx. 17-83  . Colon polyps Father        section of colon removed; unsure of #  . Heart disease Brother        Died of CHF at age 45.  Marland Kitchen Hyperlipidemia Brother   . Hypertension Brother   . Congestive Heart Failure Brother   . Heart disease Sister 74       3-vessel CAD s/p 1 stenting; CABG  . Diabetes Sister   . Hyperlipidemia Sister   . Hypertension Sister   . Cancer Brother 38       GI- esophageal; smoker  . Heart disease Brother        MIs in 70s and CABG.  . Diabetes Brother   . Hypertension Brother   . Stroke Maternal Grandmother   . Hypertension Maternal Grandmother   . Skin cancer Maternal Grandfather        multiple - unknown types; dx. 69s  . Cancer Maternal Grandfather   . Heart Problems Paternal Grandfather   . Skin cancer Maternal Aunt        multiple - unknown type; dx. 78s  . Pancreatic cancer Maternal Uncle        dx. 70s   PE: BP 128/72   Pulse 93   Ht 5\' 3"  (  1.6 m)   Wt 208 lb 9.6 oz (94.6 kg)   SpO2 96%   BMI 36.95 kg/m  Wt Readings from Last 3 Encounters:  04/10/18 208 lb 9.6 oz (94.6 kg)  01/30/18 210 lb (95.3 kg)  10/28/17 208 lb 9.6 oz (94.6 kg)   Constitutional: overweight, in NAD Eyes: PERRLA, EOMI, no exophthalmos ENT: moist mucous membranes, no thyromegaly, no cervical lymphadenopathy Cardiovascular: RRR, No MRG Respiratory: CTA B Gastrointestinal: abdomen soft, NT, ND, BS+ Musculoskeletal: no deformities, strength intact in all 4 Skin: moist, warm, no rashes Neurological: no tremor with outstretched hands, DTR normal in all 4  ASSESSMENT: 1. DM2, insulin-dependent, now better controlled, with complications - Peripheral neuropathy.  2. PN  - 2/2 DM2  3. MNG  4. HL  PLAN:  1. Patient with   long-standing, uncontrolled, type 2 diabetes, with better control in the last several months, on a complex medication regimen including basal-bolus insulin (we increased her insulin with a large meal at last visit), metformin, SGLT2 inhibitor, and GLP-1 receptor agonist.  At last visit, HbA1c was 6.8%, increased from 6.2%, but still at goal.  Her sugars were still high in the morning at that time due to dietary indiscretions, traveling over the holidays and other stressors. - At this visit, sugars are closer to target later in the day, but variable in the morning, but mostly higher than target.  She is usually having late dinners after she gets home from work, around 9 PM.  She goes to bed at 1 AM.  However, the situation will change as her work ends in the week for summer vacation.  She is planning to eat suppers earlier and to be more active.  Therefore, we will not change her regimen for now. - I suggested to:  Patient Instructions  Please continue: - Metformin 1000 mg 2x a day with meals. - Jardiance 25 mg daily before b'fast - Trulicity 1.5 mcg weekly - Levemir 40 units in a.m. and 60 units at bedtime - Novolog : - 20 units for a smaller meal - 25 units for a larger meal - 28-30 units for a very large meal   Please return in 4 months with your sugar log.   - today, HbA1c is 6.6% (better) - continue checking sugars at different times of the day - check 3x a day, rotating checks - advised for yearly eye exams >> she is not UTD - Return to clinic in 4 mo with sugar log     2. PN -She continues on B complex and alpha lipoic acid - she had Nerve conduction study >> confirmed PN - is going to PT for balance improvement >> better  3. MNG -Reviewed her most recent thyroid ultrasound report from 04/2017: Right dominant nodule, stable.  This nodule was previously biopsied with benign results -She also had a neck CT recently which did not show any worrisome nodules in the neck -She has no neck  compression symptoms - last TSH normal: Lab Results  Component Value Date   TSH 2.280 03/18/2017  -We will follow this expectantly for now  4. HL - Reviewed latest lipid panel  - Continues Crestor without side effects.  Philemon Kingdom, MD PhD [facility] Endocrinology

## 2018-04-10 NOTE — Addendum Note (Signed)
Addended by: Drucilla Schmidt on: 04/10/2018 03:53 PM   Modules accepted: Orders

## 2018-04-10 NOTE — Patient Instructions (Signed)
Please continue: - Metformin 1000 mg 2x a day with meals. - Jardiance 25 mg daily before b'fast - Trulicity 1.5 mcg weekly - Levemir 40 units in a.m. and 60 units at bedtime - Novolog : - 20 units for a smaller meal - 25 units for a larger meal - 28-30 units for a very large meal   Please return in 4 months with your sugar log.

## 2018-04-15 ENCOUNTER — Ambulatory Visit: Payer: BC Managed Care – PPO | Admitting: Physical Therapy

## 2018-04-15 DIAGNOSIS — R208 Other disturbances of skin sensation: Secondary | ICD-10-CM

## 2018-04-15 DIAGNOSIS — R2681 Unsteadiness on feet: Secondary | ICD-10-CM | POA: Diagnosis not present

## 2018-04-15 NOTE — Therapy (Addendum)
[place], [place], [address] Phone: [phone]   Fax:  [phone]  Physical Therapy Treatment  Patient Details  Name: Madison Cowan MRN: [id number] Date of Birth: 07-23-1961 Referring Provider: Ellouise Newer, MD   Encounter Date: 04/15/2018  PT End of Session - 04/15/18 0840    Visit Number  8    Number of Visits  13    Date for PT Re-Evaluation  05/02/18    Authorization Type  BCBS    PT Start Time  0805    PT Stop Time  3903    PT Time Calculation (min)  38 min    Activity Tolerance  Patient tolerated treatment well;No increased pain    Behavior During Therapy  WFL for tasks assessed/performed       Past Medical History:  Diagnosis Date  . Allergy    Zyrtec daily.  . Arthritis    DDD lumbar spine  . Asthma    Albuterol seasonally.  . Diabetes mellitus without complication ([place])   . Glaucoma   . Heart murmur   . Hyperlipidemia   . Hypertension   . Neuromuscular disorder ([place])    R third nerve palsy  . OSA (obstructive sleep apnea) 11/11/2006   CPAP  . Thyroid disease    Thyroid nodules s/p needle biopsy negative.      Past Surgical History:  Procedure Laterality Date  . CHOLECYSTECTOMY OPEN  1979    There were no vitals filed for this visit.  Subjective Assessment - 04/15/18 1018    Subjective  Pt reports she has been busy and sitting more the last few days.  Rt knee has been more aggrivated lately.      Patient Stated Goals  "I'd like to have better balance. I don't want to start falling."     Currently in Pain?  Yes    Pain Score  5     Pain Location  Knee    Pain Orientation  Right    Pain Descriptors / Indicators  Aching    Aggravating Factors   prolonged sitting    Pain Relieving Factors  stretching, medicine.          [facility] PT Assessment - 04/15/18 0001      Assessment   Medical Diagnosis  Peripheral neuropathy    Referring Provider  Ellouise Newer, MD    Next MD Visit  06/2018      Ambulation/Gait   Gait Comments  Gait performed outside on pavement and uneven grass while scanning environment,SBA progressed to mod I, ~150 ft, no LOB.        [facility] Adult PT Treatment/Exercise - 04/15/18 0001      Knee/Hip Exercises: Stretches   Passive Hamstring Stretch  Right;Left;3 reps;30 seconds supine with strap    Quad Stretch  Right;Left;30 seconds;4 reps prone with strap x 3, seated x 1    Gastroc Stretch  Right;Left;2 reps;30 seconds    Other Knee/Hip Stretches  plantar fascia stretch x 30 sec x 2 reps  (after warrior 2 pose)      Knee/Hip Exercises: Aerobic   Other Aerobic  walking laps around gym in between exercises due decrease "burning" in feet.       Knee/Hip Exercises: Standing   Other Standing Knee Exercises  Standing on 1/2 foam roller (flat side up) x 30 sec x 2 reps(occasional UE support to steady);  tandem stance on 1/2 foam roller (flat side down and up)  x 30 sec each foot forward.    Warrior II pose x 30 sec, including small pulses last 10 sec x 1 rep each side.          Biofreeze applied to Rt knee during session and sample packets issued at end of session.   PT Education - 04/15/18 1013    Education provided  Yes    Education Details  reviewed HEP and variations of balance exercises to be performed at home.     Person(s) Educated  Patient    Methods  Explanation    Comprehension  Verbalized understanding       PT Short Term Goals - 04/15/18 0816      PT SHORT TERM GOAL #1   Title  Pt will initiate HEP in order to indicate decreased fall risk and improved functional mobility (Target Date: 04/02/18)    Time  4    Period  Weeks    Status  Achieved      PT SHORT TERM GOAL #2   Title  Pt will report positional dizziness has resolved in order to indicate BPPV was cleared.     Time  4    Period  Weeks    Status  Achieved      PT SHORT TERM GOAL #3   Title  Pt will ambulate 300' over indoor surfaces while scanning  environment at mod I level in order to indicate safe community negotiation.      Time  4    Period  Weeks    Status  Achieved      PT SHORT TERM GOAL #4   Title  Pt will improve FGA to 21/30 in order to indicate decreased fall risk.      Time  4    Period  Weeks    Status  Achieved        PT Long Term Goals - 04/15/18 3202      PT LONG TERM GOAL #1   Title  Pt will be independent with final HEP in order to indicate decreased fall risk and improved functional mobility.  (Target Date: 05/02/18)    Time  8    Period  Weeks    Status  Achieved      PT LONG TERM GOAL #2   Title  Pt will improve FGA to >/=24/30 in order to indicate decreased fall risk.      Time  8    Period  Weeks    Status  Achieved      PT LONG TERM GOAL #3   Title  Pt will ambulate >1000' over varying outdoor surfaces (including grass) while scanning environment without overt LOB at mod I level in order to indicate safe community and leisure mobility.     Time  8    Status  Achieved per pt report.       PT LONG TERM GOAL #4   Title  Pt will tolerate standing on foam airex x 30 secs with feet together, EC with minimal postural sway.     Time  8    Period  Weeks    Status  Achieved            Plan - 04/15/18 1015    Clinical Impression Statement  Pt reporting increased knee pain today, however she has reported increased sitting and less exercise.  Pt tolerated all exercises well, without LOB. She has met all goals and is agreeable to D/C to HEP at  this time.     Rehab Potential  Good    PT Frequency  2x / week    PT Duration  4 weeks    PT Treatment/Interventions  ADLs/Self Care Home Management;Canalith Repostioning;Gait training;Stair training;Functional mobility training;Therapeutic activities;Therapeutic exercise;Balance training;Neuromuscular re-education;Patient/family education;Vestibular;Passive range of motion    PT Next Visit Plan  spoke to supervising PT; will d/c at this time.     Consulted  and Agree with Plan of Care  Patient       Patient will benefit from skilled therapeutic intervention in order to improve the following deficits and impairments:  Decreased balance, Decreased strength, Dizziness, Impaired flexibility, Postural dysfunction, Impaired sensation  Visit Diagnosis: Unsteadiness on feet  Other disturbances of skin sensation     Problem List Patient Active Problem List   Diagnosis Date Noted  . Type 2 diabetes, controlled, with peripheral neuropathy ([place]) 11/01/2016  . OSA on CPAP 09/08/2015  . Family history of colonic polyps 07/28/2015  . Genetic testing 07/28/2015  . Family history of breast cancer in mother 02/04/2015  . Family history of ovarian cancer 02/04/2015  . Spondylolisthesis at L4-L5 level 05/06/2014  . Multinodular goiter 11/26/2011  . Gout 11/26/2011  . Hyperlipidemia 09/10/2010  . Overweight 09/10/2010  . TOBACCO ABUSE 09/10/2010  . HYPERTENSION, BENIGN 09/10/2010   Kerin Perna, PTA 04/15/18 10:20 AM  [place], [place], [address] Phone: [phone]   Fax:  [phone]  Name: Aleisha Paone MRN: [id number] Date of Birth: Mar 25, 1961  PHYSICAL THERAPY DISCHARGE SUMMARY  Visits from Start of Care: 8  Current functional level related to goals / functional outcomes: See progress note for discharge status   Remaining deficits: Should continue with HEP to progress with balance and coordination    Education / Equipment: HEP  Plan: Patient agrees to discharge.  Patient goals were met. Patient is being discharged due to meeting the stated rehab goals.  ?????    Celyn P. Helene Kelp PT, MPH 04/15/18 10:43 AM

## 2018-04-17 ENCOUNTER — Other Ambulatory Visit: Payer: Self-pay | Admitting: Internal Medicine

## 2018-04-17 ENCOUNTER — Other Ambulatory Visit: Payer: Self-pay | Admitting: Family Medicine

## 2018-04-17 NOTE — Telephone Encounter (Signed)
Rosuvastatin refill Last Refill:03/29/17 # 90 3 RF Last OV: 10/15/17 with upcoming appt 05/04/18  PCP: Reginia Forts MD Pharmacy:[facility] Lipid: 10/15/17  Liver: 10/15/17

## 2018-04-26 ENCOUNTER — Other Ambulatory Visit: Payer: Self-pay | Admitting: Family Medicine

## 2018-05-04 ENCOUNTER — Encounter: Payer: BC Managed Care – PPO | Admitting: Family Medicine

## 2018-05-08 ENCOUNTER — Other Ambulatory Visit: Payer: Self-pay | Admitting: Family Medicine

## 2018-05-13 ENCOUNTER — Other Ambulatory Visit: Payer: Self-pay | Admitting: Family Medicine

## 2018-05-15 NOTE — Telephone Encounter (Signed)
Hyzaar 100-12.5 mg refill request  LOV 10/15/17 with Dr. Tamala Julian   Had F/U 05/04/18 but cancelled by provider.   No future appts noted.   Per notes needs apppt.  [facility] - [place], [place]

## 2018-05-23 ENCOUNTER — Other Ambulatory Visit: Payer: Self-pay | Admitting: Internal Medicine

## 2018-06-25 ENCOUNTER — Other Ambulatory Visit: Payer: Self-pay | Admitting: Internal Medicine

## 2018-06-25 ENCOUNTER — Ambulatory Visit: Payer: BC Managed Care – PPO | Admitting: Neurology

## 2018-07-10 ENCOUNTER — Other Ambulatory Visit: Payer: Self-pay | Admitting: Internal Medicine

## 2018-07-21 ENCOUNTER — Other Ambulatory Visit: Payer: Self-pay | Admitting: Internal Medicine

## 2018-08-03 ENCOUNTER — Other Ambulatory Visit: Payer: Self-pay | Admitting: Family Medicine

## 2018-08-03 NOTE — Telephone Encounter (Signed)
Metformin 1000 mg refill Last Refill:10/27/17 # 180 Last OV: 10/15/17 PCP: Reginia Forts Pharmacy:  Returned because not established with a provider.

## 2018-08-08 ENCOUNTER — Other Ambulatory Visit: Payer: Self-pay | Admitting: Family Medicine

## 2018-08-11 ENCOUNTER — Other Ambulatory Visit: Payer: Self-pay | Admitting: Family Medicine

## 2018-08-12 ENCOUNTER — Ambulatory Visit (INDEPENDENT_AMBULATORY_CARE_PROVIDER_SITE_OTHER): Payer: BC Managed Care – PPO | Admitting: Internal Medicine

## 2018-08-12 ENCOUNTER — Encounter: Payer: Self-pay | Admitting: Internal Medicine

## 2018-08-12 VITALS — BP 130/70 | HR 89 | Ht 63.0 in | Wt 208.0 lb

## 2018-08-12 DIAGNOSIS — E1142 Type 2 diabetes mellitus with diabetic polyneuropathy: Secondary | ICD-10-CM | POA: Diagnosis not present

## 2018-08-12 DIAGNOSIS — E042 Nontoxic multinodular goiter: Secondary | ICD-10-CM | POA: Diagnosis not present

## 2018-08-12 DIAGNOSIS — E7800 Pure hypercholesterolemia, unspecified: Secondary | ICD-10-CM | POA: Diagnosis not present

## 2018-08-12 LAB — POCT GLYCOSYLATED HEMOGLOBIN (HGB A1C): HEMOGLOBIN A1C: 7.2 % — AB (ref 4.0–5.6)

## 2018-08-12 MED ORDER — INSULIN ASPART 100 UNIT/ML ~~LOC~~ SOLN: SUBCUTANEOUS | 4 refills | Status: DC

## 2018-08-12 MED ORDER — EMPAGLIFLOZIN 25 MG PO TABS: 25.0000 mg | ORAL_TABLET | Freq: Every day | ORAL | 3 refills | Status: DC

## 2018-08-12 MED ORDER — DULAGLUTIDE 1.5 MG/0.5ML ~~LOC~~ SOAJ: SUBCUTANEOUS | 3 refills | Status: DC

## 2018-08-12 MED ORDER — INSULIN DETEMIR 100 UNIT/ML FLEXPEN: PEN_INJECTOR | SUBCUTANEOUS | 3 refills | Status: DC

## 2018-08-12 MED ORDER — METFORMIN HCL 1000 MG PO TABS: ORAL_TABLET | ORAL | 3 refills | Status: DC

## 2018-08-12 NOTE — Progress Notes (Signed)
Patient ID: Madison Cowan, female   DOB: [birth year], 59 y.o.   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 Last visit 4 months ago.  Since school started >> sugars worse as she is eating out (fast food) more.  Had a steroid inj in knee 2 weeks ago.   DM2: Last hemoglobin A1c was: Lab Results  Component Value Date   HGBA1C 6.6 (A) 04/10/2018   HGBA1C 6.8 10/28/2017   HGBA1C 6.2 07/29/2017   She is on: - Metformin 1000 mg 2x a day with meals. - Jardiance 25 mg daily before b'fast - Trulicity 1.5 mcg weekly - Levemir 40 units in a.m. And 60 units at bedtime - Novolog : - 20 units for a smaller meal - 25 units for a larger meal - 28-30 units for a very large meal   Pt checks her sugars 2-4 times a day, per review of her log: - am:  120-182, 201, 200s when off Jardiace >> 149, 168, 180 - 2h after b'fast: 150 >> 177, 208 >>> n/c - before lunch:186, 190 >> 121-170 (late b'fast) >> 130-196, 213 - 2h after lunch:72, 161, 195 >> 173, 210 - both off Jardiance >> n/c - before dinner: 89-161, 195 >> 89-178, 197  >> 121 - 2h after dinner: 113-183, 207, 283 >> 131-156, 220 >> 172, 187 - bedtime:72 >> 150, 161 >> n/c - nighttime: n/c >> 133 >> n/c Lowest sugar was 72 >> 89 >> 121; she has hypoglycemia awareness in the 70s. Highest sugar was  283 >> 200s >> 213.  Glucometer: One Touch ultra  Pt's meals are: - Breakfast: protein shakes (6g carbs); McDonads >> usually skip  - Lunch: may skip; salads cheeseburger; school cafeteria food >> may skip - Dinner: fast food; porc chops + collard greens + salad; hamburger; McNuggets - Snacks: sometimes; sweets, nuts, chips She does not have organized mealtimes.  -+ Mild CKD, last BUN/creatinine:  Lab Results  Component Value Date   BUN 20 10/15/2017   BUN 17 03/18/2017    CREATININE 0.92 10/15/2017   CREATININE 1.07 (H) 03/18/2017   -+ HL. Last set of lipids: Lab Results  Component Value Date   CHOL 105 10/15/2017   HDL 32 (L) 10/15/2017   LDLCALC 46 10/15/2017   TRIG 137 10/15/2017   CHOLHDL 3.3 10/15/2017  On Crestor 10. - last eye exam was in 01/2017: No DR. + glaucoma suspect -+  Numbness, tingling, burning in her feet.  On B complex, alpha lipoic acid, Lyrica.  She also has HTN.  Thyroid nodules:  Reviewed her imaging reports: Thyroid ultrasound (01/03/2016): Dominant nodule is a right mid lobe hypoechoic nodule of 2.1 x 0.8 x 1.2 cm, and previously measured up to 2.3 cm. There is one left thyroid nodule measuring 6 mm and several other tiny scattered nodules.  She had a benign biopsy of the dominant nodule.  Thyroid ultrasound (04/29/2017): Previously biopsied right nodule is unchanged.  She has 2 additional subcentimeter thyroid nodules, that did not require follow-up.  Also, a hypoechoic lymph node on the right side of the neck, 1.9 cm x 0.5 cm.  Neck CT (11/19/2017): Thyroid: Small thyroid nodules as seen at ultrasound. No worrisome finding by CT. Largest nodule on the right measures 1.7 cm. Lymph nodes: No enlarged or low-density nodes on either side  of the neck. Symmetric normal appearing lymph nodes by size criteria.  Pt denies: - feeling nodules in neck - hoarseness - dysphagia - choking - SOB with lying down  She is meeting with friends one afternoon a month to play board games.  ROS: Constitutional: no weight gain/no weight loss, no fatigue, no subjective hyperthermia, no subjective hypothermia Eyes: no blurry vision, no xerophthalmia ENT: + Sore throat, + see HPI Cardiovascular: no CP/no SOB/no palpitations/no leg swelling Respiratory: + Cough/no SOB/no wheezing Gastrointestinal: no N/no V/no D/+ C/no acid reflux Musculoskeletal: no muscle aches/+ joint aches Skin: no rashes, no hair loss Neurological: no tremors/no  numbness/no tingling/no dizziness  I reviewed pt's medications, allergies, PMH, social hx, family hx, and changes were documented in the history of present illness. Otherwise, unchanged from my initial visit note.   Past Medical History:  Diagnosis Date  . Allergy    Zyrtec daily.  . Arthritis    DDD lumbar spine  . Asthma    Albuterol seasonally.  . Diabetes mellitus without complication ([place])   . Glaucoma   . Heart murmur   . Hyperlipidemia   . Hypertension   . Neuromuscular disorder ([place])    R third nerve palsy  . OSA (obstructive sleep apnea) 11/11/2006   CPAP  . Thyroid disease    Thyroid nodules s/p needle biopsy negative.    Also, spondylolisthesis L4-L5  Past Surgical History:  Procedure Laterality Date  . CHOLECYSTECTOMY OPEN  1979   Social History   Social History  . Marital status: Divorced    Spouse name: N/A  . 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 Smoking status: Former Smoker    Packs/day: 0.05    Years: 18.00    Types: Cigarettes    Quit date: 02/2016  . Smokeless tobacco: Never Used  . Alcohol use 0.0 oz/week     Comment: maybe 1-2 drinks per yr  . Drug use: No  . Sexual activity: No   Other Topics Concern  . 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  [place] work.      Tobacco: 1/2 pdd x 20 years.      Alcohol:  None      Drugs: none       Education: [employer].       Exercise: No.      Seatbelt: 100%      Guns: unloaded.      Sexually active: none; total partners = 5.  Last STD screen 2006.     Current Outpatient Medications on File Prior to Visit  Medication Sig Dispense Refill  . Ascorbic Acid (VITAMIN C) 100 MG tablet Take 100 mg by mouth daily.    Marland Kitchen aspirin  325 MG EC tablet Take 325 mg by mouth daily.    Marland Kitchen b complex vitamins tablet Take 1 tablet by mouth daily.    . BD INSULIN SYRINGE U/F 31G X 5/16" 1 ML MISC USE TO GIVE 10 TO 15 UNITS THREE TIMES A DAY BEFORE MEALS 100 each 0  . BD VEO INSULIN SYRINGE U/F 31G X 15/64" 1 ML MISC USE TO GIVE  10 TO 15 UNITS THREE TIMES A DAY BEFORE MEALS 100 each 0  . co-enzyme Q-10 30 MG capsule Take 30 mg by mouth 3 (three) times daily.     Marland Kitchen glucose blood (ONE TOUCH ULTRA TEST) test strip TEST TWICE DAILY 100 each 2  . Insulin Detemir (LEVEMIR) 100 UNIT/ML Pen Inject under skin 100 units a day as advised 30 mL 5  . Insulin Pen Needle (BD PEN NEEDLE NANO U/F) 32G X 4 MM MISC INJECT INSULIN 5 TIMES DAILY 100 each 2  . JARDIANCE 25 MG TABS tablet TAKE ONE TABLET BY MOUTH DAILY 30 tablet 2  . LEVEMIR FLEXTOUCH 100 UNIT/ML Pen INJECT 60 UNITS UNDER THE SKIN TWICE A DAY 30 pen 0  . levocetirizine (XYZAL) 5 MG tablet Take 5 mg by mouth every evening.    Marland Kitchen losartan-hydrochlorothiazide (HYZAAR) 100-12.5 MG tablet Take 1 tablet by mouth daily. Pt need new provider for refills 30 tablet 0  . metFORMIN (GLUCOPHAGE) 1000 MG tablet TAKE ONE TABLET BY MOUTH TWICE A DAY 180 tablet 2  . Multiple Vitamins-Minerals (MULTIVITAMIN WITH MINERALS) tablet Take 1 tablet by mouth daily.    Marland Kitchen NOVOLOG 100 UNIT/ML injection INJECT 20 TO 25 UNITS INTO THE SKIN THREE TIMES DAILY WITH MEALS 40 mL 4  . pregabalin (LYRICA) 25 MG capsule Take 1-2 capsules (25-50 mg total) by mouth at bedtime. (Patient not taking: Reported on 04/10/2018) 60 capsule 5  . rosuvastatin (CRESTOR) 10 MG tablet TAKE ONE TABLET BY MOUTH DAILY 30 tablet 0  . TRULICITY 1.5 EX/9.3ZJ SOPN INJECT 1.5 MG UNDER THE SKIN ONCE WEEKLY AS DIRECTED. 2 pen 0  . TRULICITY 1.5 IR/6.7EL SOPN INJECT 1.5 MG UNDER THE SKIN ONCE WEEKLY 2 pen 1   No current facility-administered medications on file prior to visit.    No Known Allergies Family History  Problem Relation Age of Onset  .  Diabetes Mother   . Hypertension Mother   . Ovarian cancer Mother 48  . Breast cancer Mother 93  . Cancer Mother 54       ovarian cancer age 38; breast cancer age 8  . Diabetes Father   . Heart disease Father 82       CAD/CABG; AAA; CHF  . Hyperlipidemia Father   . Hypertension Father   . Prostate cancer Father        dx. 61-83  . Colon polyps Father        section of colon removed; unsure of #  . Heart disease Brother        Died of CHF at age 4.  Marland Kitchen Hyperlipidemia Brother   . Hypertension Brother   . Congestive Heart Failure Brother   . Heart disease Sister 72       3-vessel CAD s/p 1 stenting; CABG  . Diabetes Sister   . Hyperlipidemia Sister   . Hypertension Sister   . Cancer Brother 71       GI- esophageal; smoker  . Heart disease Brother        MIs in 17s and CABG.  . Diabetes Brother   . Hypertension Brother   . Stroke Maternal Grandmother   . Hypertension Maternal Grandmother   . Skin cancer Maternal Grandfather        multiple - unknown types; dx. 34s  . Cancer Maternal Grandfather   . Heart Problems Paternal Grandfather   . Skin cancer Maternal Aunt        multiple - unknown type; dx. 8s  .  Pancreatic cancer Maternal Uncle        dx. 96s   PE: BP 130/70   Pulse 89   Ht 5\' 3"  (1.6 m) Comment: measured  Wt 208 lb (94.3 kg)   SpO2 97%   BMI 36.85 kg/m  Wt Readings from Last 3 Encounters:  08/12/18 208 lb (94.3 kg)  04/10/18 208 lb 9.6 oz (94.6 kg)  01/30/18 210 lb (95.3 kg)   Constitutional: overweight, in NAD Eyes: PERRLA, EOMI, no exophthalmos ENT: moist mucous membranes, no thyromegaly, no cervical lymphadenopathy Cardiovascular: RRR, No MRG Respiratory: CTA B Gastrointestinal: abdomen soft, NT, ND, BS+ Musculoskeletal: no deformities, strength intact in all 4 Skin: moist, warm, no rashes Neurological: no tremor with outstretched hands, DTR normal in all 4  ASSESSMENT: 1. DM2, insulin-dependent, now better controlled, with complications -  Peripheral neuropathy.  2. PN  - 2/2 DM2  3. MNG  4. HL  PLAN:  1. Patient with long-standing, previously uncontrolled, type 2 diabetes, with better control in the last year, on a complex medication regimen including basal-bolus insulin, metformin, SGLT2 inhibitor and GLP-1 receptor agonist.  At last visit, HbA1c was better than before, at 6.6%.  Her sugars were closer to target later in the day, but still variable in the morning - mostly higher than target.  At that time, she was telling me that she was having late dinners, around 9 PM, after getting home from work.  She usually goes to bed around 1 AM.  However she anticipated changes in her schedule during the summer vacation.  She was planning to eat suppers earlier and to be more active.  Therefore, we did not change the regimen at that time. - At this visit, sugars are higher and they stay high at all times of the day.  Her average is approximately 160.  They are not very fluctuating.  She tells me that since school started, she is more tired, she wakes up late, skips breakfast, and sometimes even skips lunch as she is very busy.  At night, she comes home late and stops at the fast food restaurant on her way home.  I suggested to try to stop eating out, to do as much home cooking as possible, to rely on batch cooking, during the weekend, and also try to have compliant foods in her fridge. - Since her sugars are high throughout the day, we will also increase her Levemir slightly - I suggested to:  Patient Instructions  Please continue: - Metformin 1000 mg 2x a day with meals. - Jardiance 25 mg daily before b'fast - Trulicity 1.5 mcg weekly - Novolog : - 20 units for a smaller meal - 25 units for a larger meal - 28-30 units for a very large meal   Please increase: - Levemir to 50 units in a.m. And 70 units at bedtime  Please return in 4 months with your sugar log.    - today, HbA1c is 7.2% (higher) - continue checking sugars at  different times of the day - check 3x a day, rotating checks - advised for yearly eye exams >> she is not UTD - Return to clinic in 4 mo with sugar log     2. PN -Confirmed by nerve conduction study -She continues on B complex and alpha lipoic acid -Continues to go to physical therapy for balance improvement.  This has helped.  3. MNG -Reviewed her most recent thyroid ultrasound report from 04/2017: Right dominant nodule stable.  This nodule was  previously biopsied with benign results.  She had the next CT before last visit which did not show any worrisome nodules in her neck. -Denies neck compression symptoms - last TSH normal: Lab Results  Component Value Date   TSH 2.280 03/18/2017  -We will follow this expectantly for now  4. HL - Reviewed latest lipid panel from 10/2017: Fractions at goal, with the exception of HDL which is slightly low Lab Results  Component Value Date   CHOL 105 10/15/2017   HDL 32 (L) 10/15/2017   LDLCALC 46 10/15/2017   TRIG 137 10/15/2017   CHOLHDL 3.3 10/15/2017  - Continues Crestor without side effects.  Philemon Kingdom, MD PhD [facility] Endocrinology

## 2018-08-12 NOTE — Patient Instructions (Addendum)
Please continue: - Metformin 1000 mg 2x a day with meals. - Jardiance 25 mg daily before b'fast - Trulicity 1.5 mcg weekly - Novolog : - 20 units for a smaller meal - 25 units for a larger meal - 28-30 units for a very large meal   Please increase: - Levemir to 50 units in a.m. And 70 units at bedtime  Please return in 4 months with your sugar log.

## 2018-08-25 ENCOUNTER — Other Ambulatory Visit: Payer: Self-pay | Admitting: Internal Medicine

## 2018-09-16 ENCOUNTER — Other Ambulatory Visit: Payer: Self-pay | Admitting: Family Medicine

## 2018-09-16 ENCOUNTER — Other Ambulatory Visit: Payer: Self-pay | Admitting: Internal Medicine

## 2018-11-16 ENCOUNTER — Ambulatory Visit: Payer: BC Managed Care – PPO | Admitting: Neurology

## 2018-11-16 ENCOUNTER — Encounter

## 2018-12-16 ENCOUNTER — Ambulatory Visit (INDEPENDENT_AMBULATORY_CARE_PROVIDER_SITE_OTHER): Payer: BC Managed Care – PPO | Admitting: Internal Medicine

## 2018-12-16 ENCOUNTER — Encounter: Payer: Self-pay | Admitting: Internal Medicine

## 2018-12-16 VITALS — BP 138/90 | HR 92 | Ht 63.0 in | Wt 209.0 lb

## 2018-12-16 DIAGNOSIS — E042 Nontoxic multinodular goiter: Secondary | ICD-10-CM

## 2018-12-16 DIAGNOSIS — E1142 Type 2 diabetes mellitus with diabetic polyneuropathy: Secondary | ICD-10-CM | POA: Diagnosis not present

## 2018-12-16 DIAGNOSIS — E7800 Pure hypercholesterolemia, unspecified: Secondary | ICD-10-CM

## 2018-12-16 LAB — POCT GLYCOSYLATED HEMOGLOBIN (HGB A1C): Hemoglobin A1C: 6.7 % — AB (ref 4.0–5.6)

## 2018-12-16 LAB — COMPLETE METABOLIC PANEL WITH GFR
AG RATIO: 1.7 (calc) (ref 1.0–2.5)
ALT: 25 U/L (ref 6–29)
AST: 26 U/L (ref 10–35)
Albumin: 4.2 g/dL (ref 3.6–5.1)
Alkaline phosphatase (APISO): 54 U/L (ref 37–153)
BUN/Creatinine Ratio: 23 (calc) — ABNORMAL HIGH (ref 6–22)
BUN: 25 mg/dL (ref 7–25)
CALCIUM: 9.5 mg/dL (ref 8.6–10.4)
CHLORIDE: 102 mmol/L (ref 98–110)
CO2: 27 mmol/L (ref 20–32)
Creat: 1.09 mg/dL — ABNORMAL HIGH (ref 0.50–1.05)
GFR, Est African American: 64 mL/min/{1.73_m2} (ref >=60)
GFR, Est Non African American: 56 mL/min/{1.73_m2} — ABNORMAL LOW (ref >=60)
GLUCOSE: 107 mg/dL — AB (ref 65–99)
Globulin: 2.5 g/dL (calc) (ref 1.9–3.7)
POTASSIUM: 4.8 mmol/L (ref 3.5–5.3)
Sodium: 139 mmol/L (ref 135–146)
Total Bilirubin: 0.6 mg/dL (ref 0.2–1.2)
Total Protein: 6.7 g/dL (ref 6.1–8.1)

## 2018-12-16 LAB — LIPID PANEL
Cholesterol: 98 mg/dL (ref 0–200)
HDL: 29.3 mg/dL — AB (ref >39.00)
LDL Cholesterol: 44 mg/dL (ref 0–99)
NonHDL: 68.35
Total CHOL/HDL Ratio: 3
Triglycerides: 120 mg/dL (ref 0.0–149.0)
VLDL: 24 mg/dL (ref 0.0–40.0)

## 2018-12-16 LAB — MICROALBUMIN / CREATININE URINE RATIO
Creatinine,U: 64.9 mg/dL
MICROALB/CREAT RATIO: 1.5 mg/g (ref 0.0–30.0)
Microalb, Ur: 1 mg/dL (ref 0.0–1.9)

## 2018-12-16 LAB — TSH: TSH: 1.69 u[IU]/mL (ref 0.35–4.50)

## 2018-12-16 MED ORDER — INSULIN ASPART 100 UNIT/ML ~~LOC~~ SOLN: SUBCUTANEOUS | 4 refills | Status: DC

## 2018-12-16 MED ORDER — INSULIN DETEMIR 100 UNIT/ML FLEXPEN: PEN_INJECTOR | SUBCUTANEOUS | 3 refills | Status: DC

## 2018-12-16 MED ORDER — "INSULIN SYRINGE-NEEDLE U-100 31G X 15/64"" 1 ML MISC": 3 refills | Status: DC

## 2018-12-16 MED ORDER — INSULIN PEN NEEDLE 32G X 4 MM MISC: 3 refills | Status: DC

## 2018-12-16 MED ORDER — GLUCOSE BLOOD VI STRP: ORAL_STRIP | 3 refills | Status: DC

## 2018-12-16 NOTE — Patient Instructions (Addendum)
Please continue: - Metformin 1000 mg 2x a day with meals. - Jardiance 25 mg daily before b'fast - Trulicity 1.5 mcg weekly - Levemir 50 units in a.m. And 70 units at bedtime - Novolog: - 20 units for a smaller meal - 25 units for a larger meal - 28-30 units for a very large meal   Please stop at the lab.  Please return in 4 months with your sugar log.

## 2018-12-16 NOTE — Progress Notes (Signed)
Patient ID: Madison Cowan, female   DOB: January 19, 1961, 58 y.o.   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 Last visit 4 months ago.  DM2: Last hemoglobin A1c was: Lab Results  Component Value Date   HGBA1C 7.2 (A) 08/12/2018   HGBA1C 6.6 (A) 04/10/2018   HGBA1C 6.8 10/28/2017   She is on: - Metformin 1000 mg 2x a day with meals. - Jardiance 25 mg daily before b'fast - Trulicity 1.5 mcg weekly - Levemir 50 units in a.m. And 70 units at bedtime -she initially had low blood sugars after we increase the dose, but they improved - Novolog: - 20 units for a smaller meal - 25 units for a larger meal - 28-30 units for a very large meal   Pt checks her sugars 2-4 times a day per review of her log: - am:  120-182, 201, 200s  >> 149, 168, 180 >> 104-177, 203 - 2h after b'fast: 150 >> 177, 208 >>> n/c - before lunch: 121-170 (late b'fast) >> 130-196, 213 >> 152-216 - 2h after lunch: 173, 210 - both off Jardiance >> n/c >> 170, 206 - before dinner: 89-178, 197  >> 121 >> 75-138, 155 - 2h after dinner:131-156, 220 >> 172, 187 >> 89-151 - bedtime:72 >> 150, 161 >> n/c - nighttime: n/c >> 133 >> n/c Lowest sugar was 72 >> 89 >> 121 >> 75 ; she has hypoglycemia awareness in the 90s. Highest sugar was  283 >> 200s >> 213 >> 216.  Glucometer: One Touch ultra  Pt's meals are: - Breakfast: protein shakes (6g carbs); Mc[employer]ads >> usually skip  - Lunch: may skip; salads cheeseburger; school cafeteria food >> may skip - Dinner: fast food; porc chops + collard greens + salad; hamburger; McNuggets - Snacks: sometimes; sweets, nuts, chips She does not have organized mealtimes.  -+ Mild CKD, last BUN/creatinine:  Lab Results  Component Value Date   BUN 20 10/15/2017   BUN 17 03/18/2017   CREATININE 0.92 10/15/2017    CREATININE 1.07 (H) 03/18/2017   -+ Dyslipidemia. Last set of lipids: Lab Results  Component Value Date   CHOL 105 10/15/2017   HDL 32 (L) 10/15/2017   LDLCALC 46 10/15/2017   TRIG 137 10/15/2017   CHOLHDL 3.3 10/15/2017  On Crestor 10. - last eye exam was in 01/2017: No DR.  She is a glaucoma suspect. - She has numbness, tingling, burning in her feet.  She is on B complex, alpha-lipoic acid, Lyrica.  She also has HTN.  Thyroid nodules:  Reviewed her imaging reports and biopsies: Thyroid ultrasound (01/03/2016): Dominant nodule is a right mid lobe hypoechoic nodule of 2.1 x 0.8 x 1.2 cm, and previously measured up to 2.3 cm. There is one left thyroid nodule measuring 6 mm and several other tiny scattered nodules.  She had a benign biopsy of the dominant nodule.  Thyroid ultrasound (04/29/2017): Previously biopsied right nodule is unchanged.  She has 2 additional subcentimeter thyroid nodules, that did not require follow-up.  Also, a hypoechoic lymph node on the right side of the neck, 1.9 cm x 0.5 cm.  Neck CT (11/19/2017): Thyroid: Small thyroid nodules as seen at ultrasound. No worrisome finding by CT. Largest nodule on the right measures 1.7 cm. Lymph nodes: No enlarged or low-density nodes on either side of the  neck. Symmetric normal appearing lymph nodes by size criteria.  Pt denies: - feeling nodules in neck - hoarseness - dysphagia - choking - SOB with lying down  Lab Results  Component Value Date   TSH 2.280 03/18/2017   She is meeting with friends one afternoon a month to play board games.  ROS: Constitutional: no weight gain/no weight loss, no fatigue, no subjective hyperthermia, no subjective hypothermia Eyes: no blurry vision, no xerophthalmia ENT: no sore throat, + see HPI Cardiovascular: no CP/no SOB/no palpitations/no leg swelling Respiratory: no cough/no SOB/no wheezing Gastrointestinal: no N/no V/no D/no C/no acid reflux Musculoskeletal: no muscle  aches/no joint aches Skin: no rashes, no hair loss Neurological: no tremors/no numbness/no tingling/no dizziness  I reviewed pt's medications, allergies, PMH, social hx, family hx, and changes were documented in the history of present illness. Otherwise, unchanged from my initial visit note.   Past Medical History:  Diagnosis Date  . Allergy    Zyrtec daily.  . Arthritis    DDD lumbar spine  . Asthma    Albuterol seasonally.  . Diabetes mellitus without complication ([place])   . Glaucoma   . Heart murmur   . Hyperlipidemia   . Hypertension   . Neuromuscular disorder ([place])    R third nerve palsy  . OSA (obstructive sleep apnea) 11/11/2006   CPAP  . Thyroid disease    Thyroid nodules s/p needle biopsy negative.    Also, spondylolisthesis L4-L5  Past Surgical History:  Procedure Laterality Date  . CHOLECYSTECTOMY OPEN  1979   Social History   Social History  . Marital status: Divorced    Spouse name: N/A  . 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 Smoking status: Former Smoker    Packs/day: 0.05    Years: 18.00    Types: Cigarettes    Quit date: 02/2016  . Smokeless tobacco: Never Used  . Alcohol use 0.0 oz/week     Comment: maybe 1-2 drinks per yr  . Drug use: No  . Sexual activity: No   Other Topics Concern  . 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  [place] work.      Tobacco: 1/2 pdd x 20 years.      Alcohol:  None      Drugs: none       Education: [employer].       Exercise: No.      Seatbelt: 100%      Guns: unloaded.      Sexually active: none; total partners = 5.  Last STD screen 2006.     Current Outpatient Medications on File Prior to Visit  Medication Sig Dispense Refill  .  Ascorbic Acid (VITAMIN C) 100 MG tablet Take 100 mg by mouth daily.    Marland Kitchen aspirin 325 MG EC tablet Take 325 mg by mouth daily.    Marland Kitchen b complex vitamins tablet Take 1 tablet by mouth daily.    . BD INSULIN SYRINGE U/F 31G X 5/16" 1 ML MISC USE TO GIVE 10 TO 15 UNITS THREE TIMES A DAY BEFORE MEALS 100 each 0  . BD VEO INSULIN  SYRINGE U/F 31G X 15/64" 1 ML MISC USE TO GIVE 10 TO 15 UNITS 3 TIMES DAILY BEFORE MEALS 100 each 3  . co-enzyme Q-10 30 MG capsule Take 30 mg by mouth 3 (three) times daily.     . Dulaglutide (TRULICITY) 1.5 AJ/6.8TL SOPN INJECT 1.5 MG UNDER THE SKIN ONCE WEEKLY 12 pen 3  . empagliflozin (JARDIANCE) 25 MG TABS tablet Take 25 mg by mouth daily. 90 tablet 3  . glucose blood (ONE TOUCH ULTRA TEST) test strip TEST TWICE DAILY 100 each 2  . insulin aspart (NOVOLOG) 100 UNIT/ML injection Inject 20-30 units before each meals. 40 mL 4  . Insulin Detemir (LEVEMIR FLEXTOUCH) 100 UNIT/ML Pen INJECT 50-70 UNITS UNDER THE SKIN TWICE A DAY 30 pen 3  . Insulin Pen Needle (BD PEN NEEDLE NANO U/F) 32G X 4 MM MISC USE 5 TIMES DAILY. 100 each 1  . levocetirizine (XYZAL) 5 MG tablet Take 5 mg by mouth every evening.    Marland Kitchen losartan-hydrochlorothiazide (HYZAAR) 100-12.5 MG tablet Take 1 tablet by mouth daily. Pt need new provider for refills 30 tablet 0  . metFORMIN (GLUCOPHAGE) 1000 MG tablet TAKE ONE TABLET BY MOUTH TWICE A DAY 180 tablet 3  . Multiple Vitamins-Minerals (MULTIVITAMIN WITH MINERALS) tablet Take 1 tablet by mouth daily.    . pregabalin (LYRICA) 25 MG capsule Take 1-2 capsules (25-50 mg total) by mouth at bedtime. 60 capsule 5  . rosuvastatin (CRESTOR) 10 MG tablet TAKE ONE TABLET BY MOUTH DAILY 30 tablet 0   No current facility-administered medications on file prior to visit.    No Known Allergies Family History  Problem Relation Age of Onset  . Diabetes Mother   . Hypertension Mother   . Ovarian cancer Mother 37  . Breast cancer Mother 76  . Cancer Mother 24       ovarian  cancer age 2; breast cancer age 50  . Diabetes Father   . Heart disease Father 41       CAD/CABG; AAA; CHF  . Hyperlipidemia Father   . Hypertension Father   . Prostate cancer Father        dx. 30-83  . Colon polyps Father        section of colon removed; unsure of #  . Heart disease Brother        Died of CHF at age 29.  Marland Kitchen Hyperlipidemia Brother   . Hypertension Brother   . Congestive Heart Failure Brother   . Heart disease Sister 55       3-vessel CAD s/p 1 stenting; CABG  . Diabetes Sister   . Hyperlipidemia Sister   . Hypertension Sister   . Cancer Brother 98       GI- esophageal; smoker  . Heart disease Brother        MIs in 28s and CABG.  . Diabetes Brother   . Hypertension Brother   . Stroke Maternal Grandmother   . Hypertension Maternal Grandmother   . Skin cancer Maternal Grandfather        multiple - unknown types; dx. 42s  . Cancer Maternal Grandfather   . Heart Problems Paternal Grandfather   . Skin cancer Maternal Aunt        multiple - unknown type; dx. 44s  . Pancreatic cancer Maternal Uncle        dx. 70s   PE: There were no vitals taken for this visit. Wt Readings from Last 3 Encounters:  08/12/18 208 lb (94.3  kg)  04/10/18 208 lb 9.6 oz (94.6 kg)  01/30/18 210 lb (95.3 kg)   Constitutional: overweight, in NAD Eyes: PERRLA, EOMI, no exophthalmos ENT: moist mucous membranes, no thyromegaly, no cervical lymphadenopathy Cardiovascular: RRR, No MRG Respiratory: CTA B Gastrointestinal: abdomen soft, NT, ND, BS+ Musculoskeletal: no deformities, strength intact in all 4 Skin: moist, warm, no rashes, + onychodystrophy Neurological: no tremor with outstretched hands, DTR normal in all 4  ASSESSMENT: 1. DM2, insulin-dependent, now better controlled, with complications - Peripheral neuropathy.  2. PN  - 2/2 DM2  3. MNG  4. HL  PLAN:  1. Patient with longstanding, previously uncontrolled diabetes, with better control in the last year, however,  worsened at last visit.  She is on a complex medication regimen including basal-bolus insulin, metformin, SGLT 2 inhibitor, and GLP-1 receptor agonist.  At last visit sugars were higher and staying high at all times of the day with an average of approximately 160.  She was eating fast food being more stressed after school started.  We discussed about the importance of stopping eating out and do as much home cooking as possible, rely mostly on batch cooking during the weekend and also to try to have only compliant foods in her house.  We also increased her basal insulin slightly. -At this visit, sugars are still high throughout the day, but improved in the last few days due to mindful eating.  She has problems resisting snacks and high calorie foods at work especially as she is usually working late and she is in there by herself.  She frequently eats candy during those times as she is very hungry.  We discussed about strategies to improve this: Taking food (dinner) from home, low calorie density snacks like fruit, water with lemon.  Otherwise, I do not feel we need to change her regimen for now.  She did initially have low blood sugars after we increase the Levemir dose and she transiently had to decrease the dose back to the previous doses from last visit.  However, afterwards, she increase the dose is back and she has no low blood sugars anymore. - I suggested to:  Patient Instructions  Please continue: - Metformin 1000 mg 2x a day with meals. - Jardiance 25 mg daily before b'fast - Trulicity 1.5 mcg weekly - Levemir 50 units in a.m. And 70 units at bedtime - Novolog: - 20 units for a smaller meal - 25 units for a larger meal - 28-30 units for a very large meal   Please return in 4 months with your sugar log.   - today, HbA1c is 6.7% (better)  - continue checking sugars at different times of the day - check 4x a day, rotating checks - advised for yearly eye exams >> she is not UTD - We will check  annual labs today - Return to clinic in 4 mo with sugar log      2. PN -Confirmed by nerve conduction study -Continues on B complex and alpha lipoic acid -She was in physical therapy for balance improvement and this has helped.  3. MNG -Reviewed the report of her most recent thyroid ultrasound from 04/2017: Right dominant nodule stable.  This nodule was previously biopsied with benign results.  She had a CT of the neck afterwards which did not show any worrisome nodules in her neck -Denies neck compression symptoms - last TSH normal: Lab Results  Component Value Date   TSH 2.280 03/18/2017  -Recheck TSH today - We  can follow her expectantly for now  4. HL - Reviewed latest lipid panel from 10/2017: LDL and triglycerides at goal, HDL low Lab Results  Component Value Date   CHOL 105 10/15/2017   HDL 32 (L) 10/15/2017   LDLCALC 46 10/15/2017   TRIG 137 10/15/2017   CHOLHDL 3.3 10/15/2017  - Continues Crestor without side effects.  Office Visit on 12/16/2018  Component Date Value Ref Range Status  . Microalb, Ur 12/16/2018 1.0  0.0 - 1.9 mg/dL Final  . Creatinine,U 12/16/2018 64.9  mg/dL Final  . Microalb Creat Ratio 12/16/2018 1.5  0.0 - 30.0 mg/g Final  . Glucose, Bld 12/16/2018 107* 65 - 99 mg/dL Final   Comment: .            Fasting reference interval . For someone without known diabetes, a glucose value between 100 and 125 mg/dL is consistent with prediabetes and should be confirmed with a follow-up test. .   . BUN 12/16/2018 25  7 - 25 mg/dL Final  . Creat 12/16/2018 1.09* 0.50 - 1.05 mg/dL Final   Comment: For patients >54 years of age, the reference limit for Creatinine is approximately 13% higher for people identified as African-American. .   . GFR, Est Non African American 12/16/2018 56* > OR = 60 mL/min/1.34m2 Final  . GFR, Est African American 12/16/2018 64  > OR = 60 mL/min/1.27m2 Final  . BUN/Creatinine Ratio 12/16/2018 23* 6 - 22 (calc) Final  . Sodium  12/16/2018 139  135 - 146 mmol/L Final  . Potassium 12/16/2018 4.8  3.5 - 5.3 mmol/L Final  . Chloride 12/16/2018 102  98 - 110 mmol/L Final  . CO2 12/16/2018 27  20 - 32 mmol/L Final  . Calcium 12/16/2018 9.5  8.6 - 10.4 mg/dL Final  . Total Protein 12/16/2018 6.7  6.1 - 8.1 g/dL Final  . Albumin 12/16/2018 4.2  3.6 - 5.1 g/dL Final  . Globulin 12/16/2018 2.5  1.9 - 3.7 g/dL (calc) Final  . AG Ratio 12/16/2018 1.7  1.0 - 2.5 (calc) Final  . Total Bilirubin 12/16/2018 0.6  0.2 - 1.2 mg/dL Final  . Alkaline phosphatase (APISO) 12/16/2018 54  37 - 153 U/L Final  . AST 12/16/2018 26  10 - 35 U/L Final  . ALT 12/16/2018 25  6 - 29 U/L Final  . Cholesterol 12/16/2018 98  0 - 200 mg/dL Final   ATP III Classification       Desirable:  < 200 mg/dL               Borderline High:  200 - 239 mg/dL          High:  > = 240 mg/dL  . Triglycerides 12/16/2018 120.0  0.0 - 149.0 mg/dL Final   Normal:  <150 mg/dLBorderline High:  150 - 199 mg/dL  . HDL 12/16/2018 29.30* >39.00 mg/dL Final  . VLDL 12/16/2018 24.0  0.0 - 40.0 mg/dL Final  . LDL Cholesterol 12/16/2018 44  0 - 99 mg/dL Final  . Total CHOL/HDL Ratio 12/16/2018 3   Final                  Men          Women1/2 Average Risk     3.4          3.3Average Risk          5.0          4.42X Average Risk  9.6          7.13X Average Risk          15.0          11.0                      . NonHDL 12/16/2018 68.35   Final   NOTE:  Non-HDL goal should be 30 mg/dL higher than patient's LDL goal (i.e. LDL goal of < 70 mg/dL, would have non-HDL goal of < 100 mg/dL)  . TSH 12/16/2018 1.69  0.35 - 4.50 uIU/mL Final  . Hemoglobin A1C 12/16/2018 6.7* 4.0 - 5.6 % Final   TSH normal. Lipids at goal except low HDL. CMP normal except slightly low GFR (stable), high Glu.  Philemon Kingdom, MD PhD [facility] Endocrinology

## 2019-04-14 ENCOUNTER — Other Ambulatory Visit: Payer: Self-pay | Admitting: Internal Medicine

## 2019-04-28 ENCOUNTER — Ambulatory Visit: Payer: BC Managed Care – PPO | Admitting: Internal Medicine

## 2019-06-04 ENCOUNTER — Other Ambulatory Visit: Payer: Self-pay | Admitting: Internal Medicine

## 2019-06-17 IMAGING — US US THYROID
1 series · 12 of 25 positions shown · non-contrast
Comparison: None.

CLINICAL DATA: Follow-up thyroid nodules. Biopsy of 2 right thyroid
nodules on 01/03/2010.

EXAM:
THYROID ULTRASOUND
TECHNIQUE: Ultrasound examination of the thyroid gland and adjacent soft
tissues was performed.

[Series 1: us thyroid · 0.04mm/px · 12 of 53 slices shown]
[im 3/53]
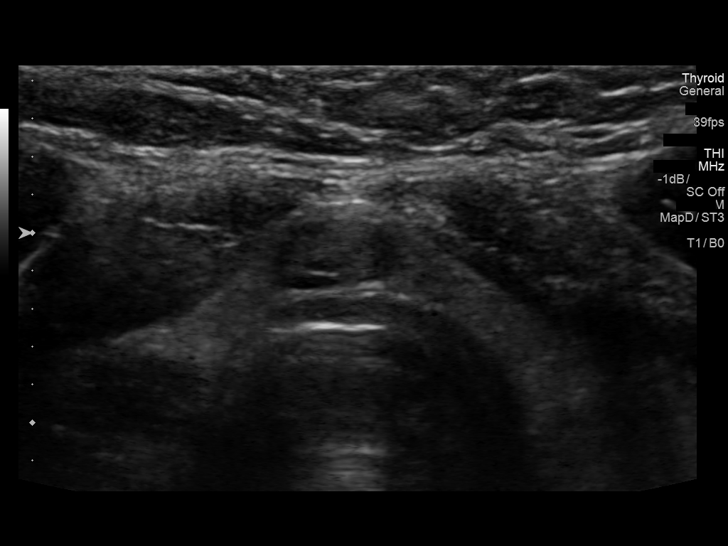
[im 7/53]
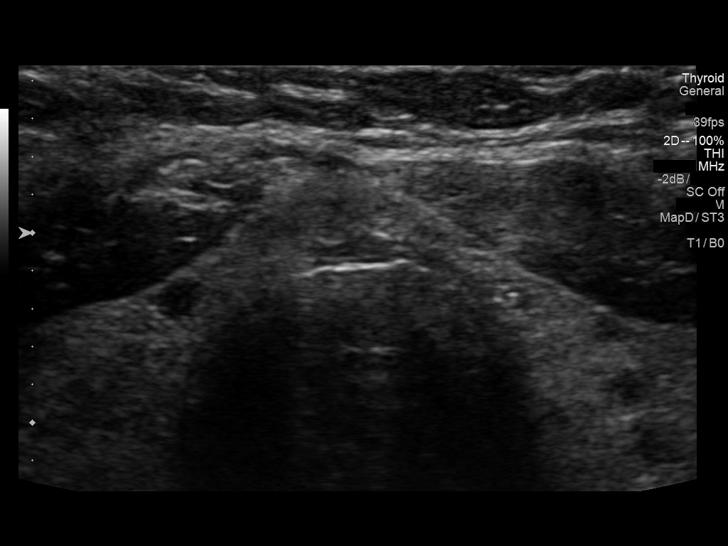
[im 11/53]
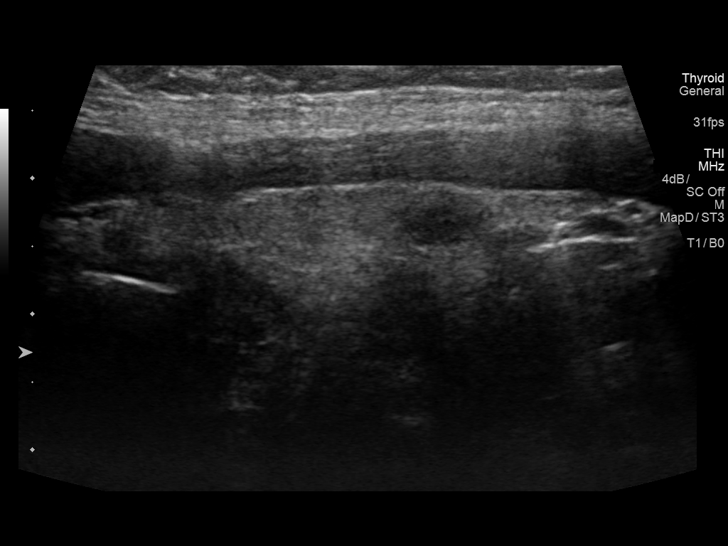
[im 16/53]
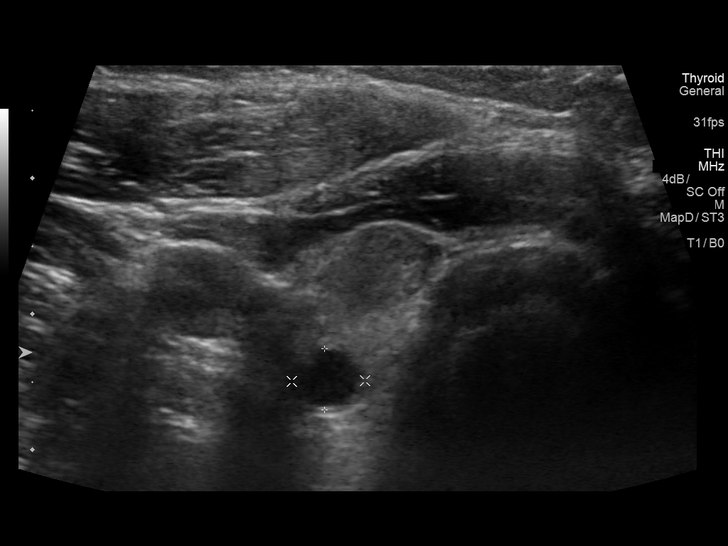
[im 20/53]
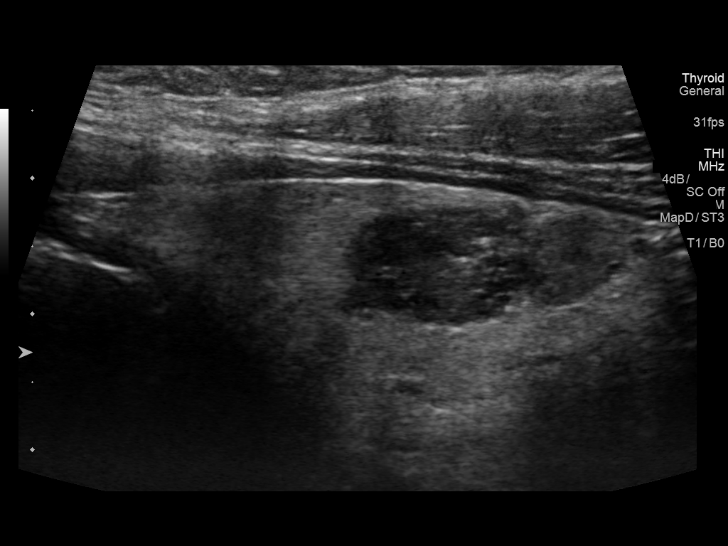
[im 24/53]
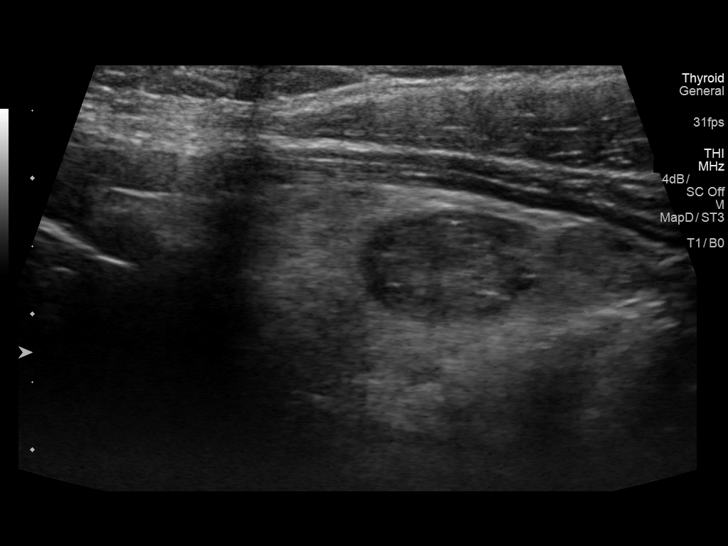
[im 29/53]
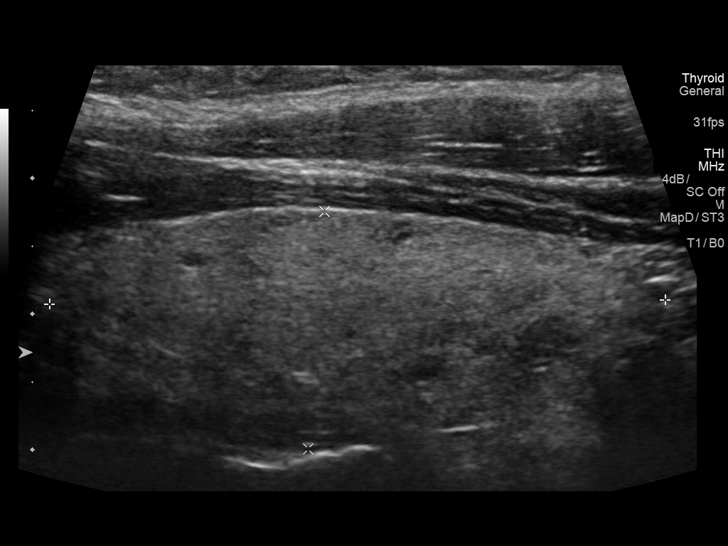
[im 33/53]
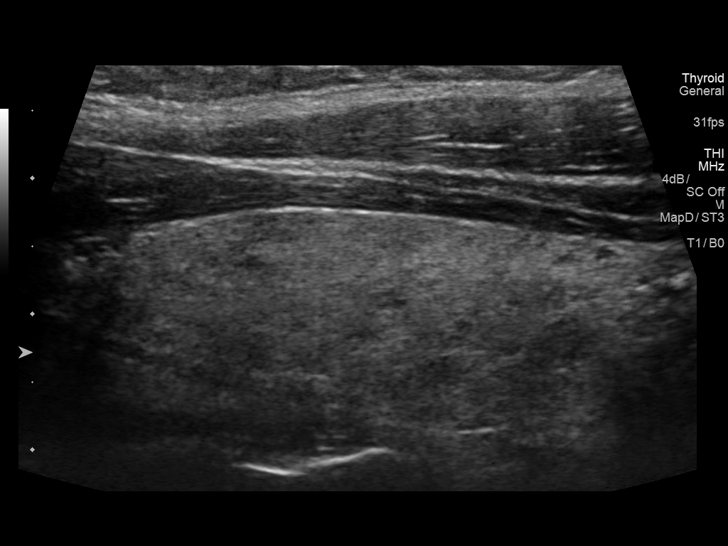
[im 37/53]
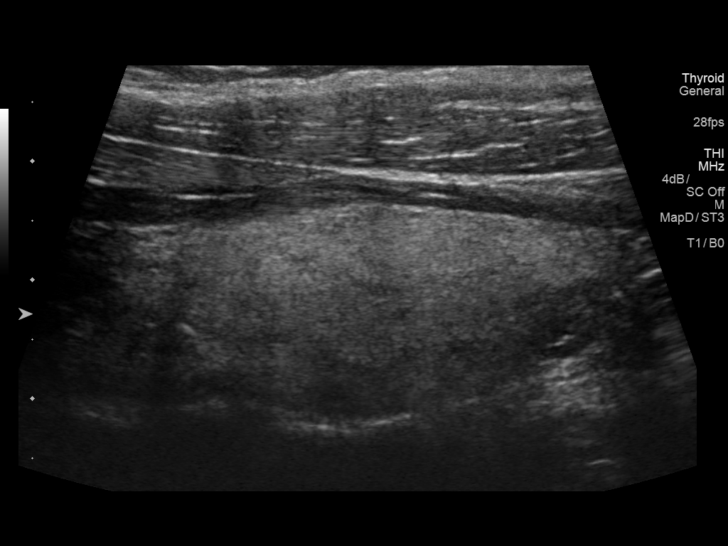
[im 42/53]
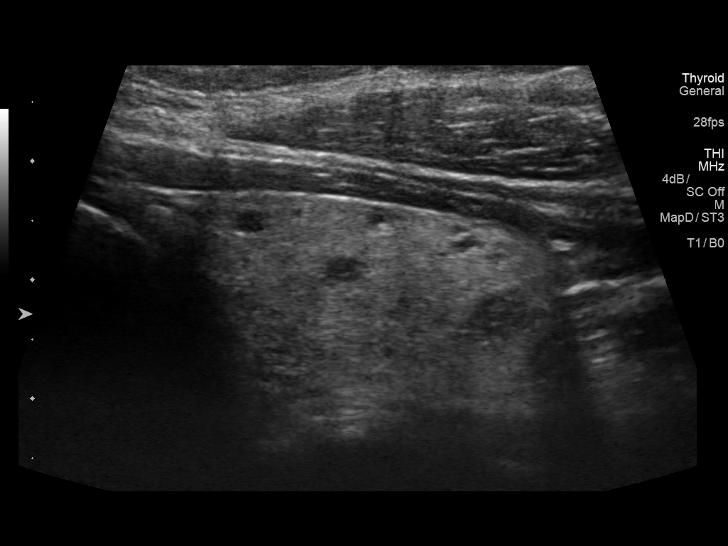
[im 46/53]
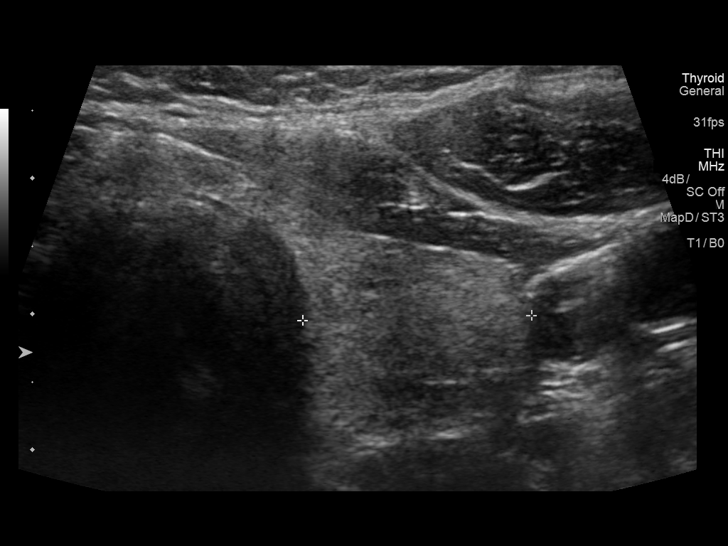
[im 50/53]
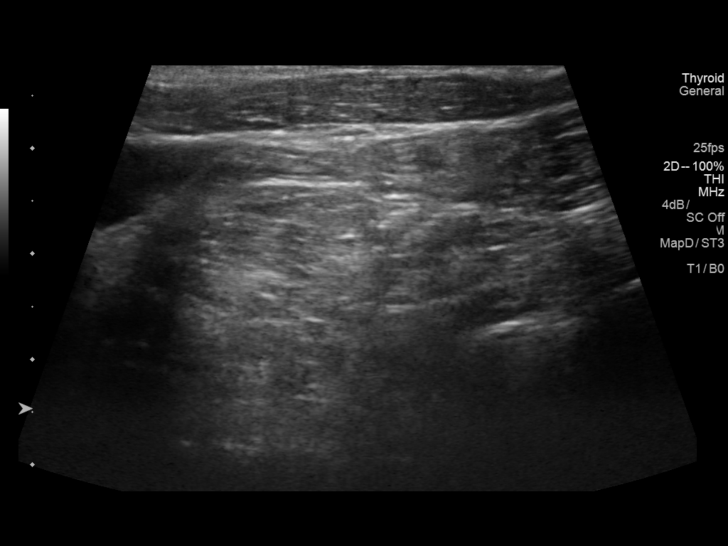

[12 of 25 positions shown; findings below may reference images not displayed]

FINDINGS: Parenchymal Echotexture: Moderately heterogenous

Isthmus: 0.3 cm in the AP dimension.

Right lobe: 4.5 x 2.1 x 2.0 cm and previously measured 5.3 x 2.1 x
2.0 cm.

Left lobe: 4.5 x 1.7 x 1.7 cm and previously measured 4.5 x 1.7 x
1.7 cm.

_________________________________________________________

Estimated total number of nodules >/= 1 cm: 1

Number of spongiform nodules >/=  2 cm not described below (TR1): 0

Number of mixed cystic and solid nodules >/= 1.5 cm not described
below (TR2): 0

_________________________________________________________

Nodule # 1:

Prior biopsy: No

Location: Isthmus; Mid

Maximum size: 0.8 cm; Other 2 dimensions: 0.4 x 0.6 cm, previously,
0.7 x 0.4 x 0.6 cm

Composition: solid/almost completely solid (2)

Echogenicity: very hypoechoic (3)

Shape: not taller-than-wide (0)

Margins: smooth (0)

Echogenic foci: none (0)

ACR TI-RADS total points: 5.

ACR TI-RADS risk category:  TR4 (4-6 points).

Significant change in size (>/= 20% in two dimensions and minimal
increase of 2 mm): No

Change in features: No

Change in ACR TI-RADS risk category: No

ACR TI-RADS recommendations:

Given size (<0.9 cm) and appearance, this nodule does NOT meet
TI-RADS criteria for biopsy or dedicated follow-up.

_________________________________________________________

Nodule #2 is the dominant nodule in the mid right thyroid lobe. This
represents a previously biopsied nodule. This nodule is hypoechoic
and slightly heterogeneous. Nodule is mostly solid. The nodule
measures 2.1 x 0.8 x 1.3 cm and previously measured 2.1 x 0.8 x
cm. No suspicious echogenic foci in this nodule. Additional
subcentimeter nodules in the right thyroid lobe.

Nodule # 3:

Prior biopsy: No

Location: Left; Inferior

Maximum size: 0.7 cm; Other 2 dimensions: 0.6 x 0.6 cm, previously,
0.6 x 0.7 x 0.6 cm

Composition: cannot determine (2)

Echogenicity: hypoechoic (2)

Shape: not taller-than-wide (0)

Margins: ill-defined (0)

Echogenic foci: macrocalcifications (1)

ACR TI-RADS total points: 5.

ACR TI-RADS risk category:  TR4 (4-6 points).

Significant change in size (>/= 20% in two dimensions and minimal
increase of 2 mm): No

Change in features: No

Change in ACR TI-RADS risk category: No

ACR TI-RADS recommendations:

Given size (<0.9 cm) and appearance, this nodule does NOT meet
TI-RADS criteria for biopsy or dedicated follow-up.

_________________________________________________________

Prominent hypoechoic lymph node on the right side of the neck
measuring 0.5 cm in the transverse dimension. This lymph node is
elongated measuring up to 1.9 cm in length. Overall, no suspicious
lymphadenopathy.
IMPRESSION: Bilateral thyroid nodules. The dominant thyroid nodules are stable.
No new suspicious nodules.

## 2019-07-01 ENCOUNTER — Other Ambulatory Visit: Payer: Self-pay | Admitting: Internal Medicine

## 2019-07-06 ENCOUNTER — Telehealth: Payer: Self-pay | Admitting: Internal Medicine

## 2019-07-06 NOTE — Telephone Encounter (Signed)
Patient has called requesting a letter to be written out of work. She's works for [employer], work will not accommodate with her concerns and they plan on Monday going back to a full room of students. Patient is now concerned and not sure what to do.  Please Advise, Thanks

## 2019-07-06 NOTE — Telephone Encounter (Signed)
I do not usually write letters exempting pts from work for diabetes because this is the majority of my patients...  However, we can write a letter mentioning that she has a chronic endocrine disease which puts her at higher risk for more severe COVID infection, if she were to get it.

## 2019-07-09 NOTE — Telephone Encounter (Signed)
I have LM that letter was provided for her to print in [place] stating that due to her type II diabetes that she was at higher risk. I explained in message we cannot do letters that actually write patients out of work. I asked that she callback if she has any further questions or concerns.

## 2019-07-13 ENCOUNTER — Other Ambulatory Visit: Payer: Self-pay | Admitting: Internal Medicine

## 2019-07-13 NOTE — Telephone Encounter (Signed)
Patient states that her letter was not attached to her MyChart.  Please Advise, Thanks

## 2019-07-14 NOTE — Telephone Encounter (Signed)
Letter has been printed and mailed.

## 2019-08-05 ENCOUNTER — Other Ambulatory Visit: Payer: Self-pay

## 2019-08-05 ENCOUNTER — Telehealth: Payer: Self-pay | Admitting: Internal Medicine

## 2019-08-05 NOTE — Telephone Encounter (Signed)
Patient has an appointment at 3 p.m. on Sept. 28th, she was wanting to know if she could do her fasting labs that morning before her appointment.  Please call patient, Thanks

## 2019-08-05 NOTE — Telephone Encounter (Signed)
Left message for patient to return our call at [phone].  

## 2019-08-05 NOTE — Telephone Encounter (Signed)
She had annual labs in 12/2018.  Which labs does she wanted rechecked?  I usually prefer to check them at the time of the appointment or after.

## 2019-08-09 ENCOUNTER — Encounter: Payer: Self-pay | Admitting: Internal Medicine

## 2019-08-09 ENCOUNTER — Other Ambulatory Visit: Payer: Self-pay

## 2019-08-09 ENCOUNTER — Ambulatory Visit (INDEPENDENT_AMBULATORY_CARE_PROVIDER_SITE_OTHER): Payer: BC Managed Care – PPO | Admitting: Internal Medicine

## 2019-08-09 VITALS — BP 148/90 | HR 105 | Ht 63.0 in | Wt 207.0 lb

## 2019-08-09 DIAGNOSIS — E7800 Pure hypercholesterolemia, unspecified: Secondary | ICD-10-CM

## 2019-08-09 DIAGNOSIS — E042 Nontoxic multinodular goiter: Secondary | ICD-10-CM

## 2019-08-09 DIAGNOSIS — G63 Polyneuropathy in diseases classified elsewhere: Secondary | ICD-10-CM | POA: Diagnosis not present

## 2019-08-09 DIAGNOSIS — E1142 Type 2 diabetes mellitus with diabetic polyneuropathy: Secondary | ICD-10-CM | POA: Diagnosis not present

## 2019-08-09 DIAGNOSIS — Z23 Encounter for immunization: Secondary | ICD-10-CM

## 2019-08-09 LAB — POCT GLYCOSYLATED HEMOGLOBIN (HGB A1C): Hemoglobin A1C: 6.4 % — AB (ref 4.0–5.6)

## 2019-08-09 MED ORDER — BD PEN NEEDLE NANO U/F 32G X 4 MM MISC: 3 refills | Status: DC

## 2019-08-09 MED ORDER — NOVOLOG FLEXPEN 100 UNIT/ML ~~LOC~~ SOPN: 15.0000 [IU] | PEN_INJECTOR | Freq: Three times a day (TID) | SUBCUTANEOUS | 3 refills | Status: DC

## 2019-08-09 MED ORDER — JARDIANCE 25 MG PO TABS: 25.0000 mg | ORAL_TABLET | Freq: Every day | ORAL | 3 refills | Status: DC

## 2019-08-09 NOTE — Patient Instructions (Addendum)
Please continue: - Metformin 1000 mg 2x a day with meals. - Jardiance 25 mg daily before b'fast  Please increase: - Trulicity 3 mcg weekly  Please decrease: - Levemir 40 units in a.m. and 50 units at bedtime - Novolog: 15-25 units before meals  Please return in 4 months with your sugar log.

## 2019-08-09 NOTE — Progress Notes (Signed)
Patient ID: Madison Cowan, female   DOB: [birth year], 58 y.o.   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 Last visit 7 months ago.  She switched to starting work earlier >> sugars are better.  DM2: Reviewed HbA1c levels: Lab Results  Component Value Date   HGBA1C 6.7 (A) 12/16/2018   HGBA1C 7.2 (A) 08/12/2018   HGBA1C 6.6 (A) 04/10/2018   She is on: - Metformin 1000 mg 2x a day with meals. - Jardiance 25 mg daily before b'fast - Trulicity 1.5 mcg weekly - Levemir 50 units in a.m. And 70 units at bedtime - Novolog: - 20 units for a smaller meal - 25 units for a larger meal - 30 units for a very large meal   Pt checks her sugars 2-4 times a day per review of her log: - am: 149, 168, 180 >> 104-177, 203 >> 92, 113-154, 189, 206  (high blood sugars when forgets Levemir at night) - 2h after b'fast: 150 >> 177, 208 >>> n/c >> 76 - before lunch: 121-170  >> 130-196, 213 >> 152-216 >> n/c - 2h after lunch: 173, 210 >> n/c >> 170, 206 >> 73 - before dinner: 89-178, 197  >> 121 >> 75-138, 155 >> 76-160 - 2h after dinner:131-156, 220 >> 172, 187 >> 89-151 >> 70, 79, 132, 282 - bedtime:72 >> 150, 161 >> n/c - nighttime: n/c >> 133 >> n/c Lowest sugar was 75 >> 70 ; she has hypoglycemia awareness in the 90s. Highest sugar was  216 >> 282.  Glucometer: One Touch ultra  Pt's meals are: - Breakfast: protein shakes (6g carbs); McDonads >> usually skip  - Lunch: may skip; salads cheeseburger; school cafeteria food >> may skip - Dinner: fast food; porc chops + collard greens + salad; hamburger; McNuggets - Snacks: sometimes; sweets, nuts, chips She does not have organized mealtimes.  -+ Mild CKD, last BUN/creatinine:  Lab Results  Component Value Date   BUN 25 12/16/2018   BUN 20 10/15/2017   CREATININE 1.09  (H) 12/16/2018   CREATININE 0.92 10/15/2017   -+ Dyslipidemia. Last set of lipids: Lab Results  Component Value Date   CHOL 98 12/16/2018   HDL 29.30 (L) 12/16/2018   LDLCALC 44 12/16/2018   TRIG 120.0 12/16/2018   CHOLHDL 3 12/16/2018  On Crestor 10. - last eye exam was in 01/2017: No DR. she is a glaucoma suspect. -+ Numbness, tingling, burning in her feet.  She is on Lyrica, B complex, alpha-lipoic acid.  She also has HTN.  Thyroid nodules:  Reviewed her imaging reports and biopsies: Thyroid ultrasound (01/03/2016): Dominant nodule is a right mid lobe hypoechoic nodule of 2.1 x 0.8 x 1.2 cm, and previously measured up to 2.3 cm. There is one left thyroid nodule measuring 6 mm and several other tiny scattered nodules.  She had a benign biopsy of the dominant nodule.  Thyroid ultrasound (04/29/2017): Previously biopsied right nodule is unchanged.  She has 2 additional subcentimeter thyroid nodules, that did not require follow-up.  Also, a hypoechoic lymph node on the right side of the neck, 1.9 cm x 0.5 cm.  Neck CT (11/19/2017): Thyroid: Small thyroid nodules as seen at ultrasound. No worrisome finding by CT. Largest nodule on the right measures 1.7 cm. Lymph nodes: No enlarged or low-density nodes on either side  of the neck. Symmetric normal appearing lymph nodes by size criteria.  Pt denies: - feeling nodules in neck - hoarseness - dysphagia - choking - SOB with lying down  Lab Results  Component Value Date   TSH 1.69 12/16/2018   Before the coronavirus pandemic, she was meeting with friends one afternoon a month to play board games.  ROS: Constitutional: no weight gain/no weight loss, no fatigue, no subjective hyperthermia, no subjective hypothermia Eyes: no blurry vision, no xerophthalmia ENT: no sore throat, + see HPI Cardiovascular: no CP/no SOB/no palpitations/no leg swelling Respiratory: no cough/no SOB/no wheezing Gastrointestinal: no N/no V/no D/no C/no acid  reflux Musculoskeletal: no muscle aches/no joint aches Skin: no rashes, no hair loss Neurological: no tremors/no numbness/no tingling/no dizziness  I reviewed pt's medications, allergies, PMH, social hx, family hx, and changes were documented in the history of present illness. Otherwise, unchanged from my initial visit note.  Past Medical History:  Diagnosis Date  . Allergy    Zyrtec daily.  . Arthritis    DDD lumbar spine  . Asthma    Albuterol seasonally.  . Diabetes mellitus without complication ([place])   . Glaucoma   . Heart murmur   . Hyperlipidemia   . Hypertension   . Neuromuscular disorder ([place])    R third nerve palsy  . OSA (obstructive sleep apnea) 11/11/2006   CPAP  . Thyroid disease    Thyroid nodules s/p needle biopsy negative.    Also, spondylolisthesis L4-L5  Past Surgical History:  Procedure Laterality Date  . CHOLECYSTECTOMY OPEN  1979   Social History   Social History  . Marital status: Divorced    Spouse name: N/A  . 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 Smoking status: Former Smoker    Packs/day: 0.05    Years: 18.00    Types: Cigarettes    Quit date: 02/2016  . Smokeless tobacco: Never Used  . Alcohol use 0.0 oz/week     Comment: maybe 1-2 drinks per yr  . Drug use: No  . Sexual activity: No   Other Topics Concern  . 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  [place] work.      Tobacco: 1/2 pdd x 20 years.      Alcohol:  None      Drugs: none       Education: [employer].       Exercise: No.      Seatbelt: 100%      Guns: unloaded.      Sexually active: none; total partners = 5.  Last STD screen 2006.     Current Outpatient Medications on File Prior to Visit   Medication Sig Dispense Refill  . Ascorbic Acid (VITAMIN C) 100 MG tablet Take 100 mg by mouth daily.    Marland Kitchen aspirin 325 MG EC tablet Take 325 mg by mouth daily.    Marland Kitchen b complex vitamins tablet Take 1 tablet by mouth daily.    Marland Kitchen co-enzyme Q-10 30 MG capsule Take 30 mg by mouth 3 (three) times daily.     . Dulaglutide (TRULICITY) 1.5 0000000 SOPN INJECT 1.5  MG UNDER THE SKIN ONCE WEEKLY 12 pen 3  . empagliflozin (JARDIANCE) 25 MG TABS tablet Take 25 mg by mouth daily. 90 tablet 3  . glucose blood (ONE TOUCH ULTRA TEST) test strip TEST 3-4x DAILY 300 each 3  . insulin aspart (NOVOLOG) 100 UNIT/ML injection Inject 20-30 units before each meals. 40 mL 4  . Insulin Pen Needle (BD PEN NEEDLE NANO U/F) 32G X 4 MM MISC USE 2 TIMES DAILY - for insulin 200 each 3  . Insulin Syringe-Needle U-100 (BD VEO INSULIN SYRINGE U/F) 31G X 15/64" 1 ML MISC USE 3 TIMES DAILY BEFORE MEALS for insulin 300 each 3  . LEVEMIR FLEXTOUCH 100 UNIT/ML Pen INJECT 50 TO 70 UNITS TWO TIMES A DAY 30 mL 2  . levocetirizine (XYZAL) 5 MG tablet Take 5 mg by mouth every evening.    Marland Kitchen losartan-hydrochlorothiazide (HYZAAR) 100-12.5 MG tablet Take 1 tablet by mouth daily. Pt need new provider for refills 30 tablet 0  . metFORMIN (GLUCOPHAGE) 1000 MG tablet TAKE ONE TABLET BY MOUTH TWICE A DAY 180 tablet 3  . Multiple Vitamins-Minerals (MULTIVITAMIN WITH MINERALS) tablet Take 1 tablet by mouth daily.    . pregabalin (LYRICA) 25 MG capsule Take 1-2 capsules (25-50 mg total) by mouth at bedtime. 60 capsule 5  . rosuvastatin (CRESTOR) 10 MG tablet TAKE ONE TABLET BY MOUTH DAILY 30 tablet 0   No current facility-administered medications on file prior to visit.    No Known Allergies Family History  Problem Relation Age of Onset  . Diabetes Mother   . Hypertension Mother   . Ovarian cancer Mother 74  . Breast cancer Mother 17  . Cancer Mother 66       ovarian cancer age 68; breast cancer age 2  . Diabetes Father   . Heart disease  Father 2       CAD/CABG; AAA; CHF  . Hyperlipidemia Father   . Hypertension Father   . Prostate cancer Father        dx. 21-83  . Colon polyps Father        section of colon removed; unsure of #  . Heart disease Brother        Died of CHF at age 48.  Marland Kitchen Hyperlipidemia Brother   . Hypertension Brother   . Congestive Heart Failure Brother   . Heart disease Sister 35       3-vessel CAD s/p 1 stenting; CABG  . Diabetes Sister   . Hyperlipidemia Sister   . Hypertension Sister   . Cancer Brother 41       GI- esophageal; smoker  . Heart disease Brother        MIs in 3s and CABG.  . Diabetes Brother   . Hypertension Brother   . Stroke Maternal Grandmother   . Hypertension Maternal Grandmother   . Skin cancer Maternal Grandfather        multiple - unknown types; dx. 68s  . Cancer Maternal Grandfather   . Heart Problems Paternal Grandfather   . Skin cancer Maternal Aunt        multiple - unknown type; dx. 80s  . Pancreatic cancer Maternal Uncle        dx. 40s   PE: BP (!) 148/90   Pulse (!) 105   Ht 5\' 3"  (1.6 m)   Wt 207 lb (93.9 kg)   BMI 36.67 kg/m  Wt Readings from Last 3 Encounters:  08/09/19 207 lb (93.9 kg)  12/16/18  209 lb (94.8 kg)  08/12/18 208 lb (94.3 kg)   Constitutional: overweight, in NAD Eyes: PERRLA, EOMI, no exophthalmos ENT: moist mucous membranes, no thyromegaly, no cervical lymphadenopathy Cardiovascular: tachycardia (she rushed to the appointment), RR, No MRG Respiratory: CTA B Gastrointestinal: abdomen soft, NT, ND, BS+ Musculoskeletal: no deformities, strength intact in all 4 Skin: moist, warm, no rashes Neurological: no tremor with outstretched hands, DTR normal in all 4  ASSESSMENT: 1. DM2, insulin-dependent, now better controlled, with complications - Peripheral neuropathy.  2. PN  - 2/2 DM2  3. MNG  4. HL  PLAN:  1. Patient with longstanding, previously uncontrolled diabetes, on the complex medication regimen including  basal-bolus insulin regimen, metformin, SGLT 2 inhibitor, and GLP-1 receptor agonist.  At last visit, sugars were still high throughout the day but improved in the last few days prior to our appointment due to mindful eating.  She was complaining about eating sweets and eating between meals so we discussed about ways to improve her cravings and to optimize her eating times.  We did not change her regimen at that time. -At this visit, sugars are slightly better than before, however, they are still fluctuating, usually due to eating time differences or skipping insulin doses. -At this visit, we discussed about trying to increase the dose of Trulicity to the newly FDA approved 3 mg weekly.  However, I am not sure if this is at the pharmacy yet so I gave her a written prescription for it.  When she is able to increase the dose, will need to decrease the Levemir and NovoLog (please see below) - I suggested to:  Patient Instructions  Please continue: - Metformin 1000 mg 2x a day with meals. - Jardiance 25 mg daily before b'fast  Please increase: - Trulicity 3 mg weekly  Please decrease: - Levemir 40 units in a.m. and 50 units at bedtime - Novolog: 15-25 units before meals  Please return in 4 months with your sugar log.   - we checked her HbA1c: 6.4% (improved) - advised to check sugars at different times of the day - 3x a day, rotating check times - advised for yearly eye exams >> she is not UTD - return to clinic in 4 months      2. PN -Confirmed by nerve conduction study -She continues on B complex and alpha-lipoic acid -She completed physical therapy for balance improvement and this has helped  3. MNG -Reviewed latest thyroid ultrasound report from 04/2017: Stable right dominant nodule.  This was previously biopsied with benign results.  She had a CT of the neck afterwards which did not show any worrisome nodules in her neck. -No neck compression symptoms -We checked TSH at last visit and  this was excellent: Lab Results  Component Value Date   TSH 1.69 12/16/2018  -We will follow this expectantly for now  4. HL -Reviewed latest lipid panel from 12/2018: At goal, with the exception of a low HDL Lab Results  Component Value Date   CHOL 98 12/16/2018   HDL 29.30 (L) 12/16/2018   LDLCALC 44 12/16/2018   TRIG 120.0 12/16/2018   CHOLHDL 3 12/16/2018  -Continues Crestor without side effects.  Philemon Kingdom, MD PhD [facility]

## 2019-08-09 NOTE — Telephone Encounter (Signed)
Spoke to patient, she was wanting her A1c checked. I let her know we will do that at her OV.  Patient notified.

## 2019-08-10 ENCOUNTER — Telehealth: Payer: Self-pay

## 2019-08-10 MED ORDER — INSULIN LISPRO (1 UNIT DIAL) 100 UNIT/ML (KWIKPEN): 15.0000 [IU] | PEN_INJECTOR | Freq: Three times a day (TID) | SUBCUTANEOUS | 11 refills | Status: DC

## 2019-08-10 MED ORDER — NOVOLOG FLEXPEN 100 UNIT/ML ~~LOC~~ SOPN: 15.0000 [IU] | PEN_INJECTOR | Freq: Three times a day (TID) | SUBCUTANEOUS | 3 refills | Status: DC

## 2019-08-10 NOTE — Telephone Encounter (Signed)
Took call from patient pharmacy, they state Humalog is NOT covered, but the Novolog flexpen ARE covered.  I let them know to go ahead and fill the Novolog and he will let patient know.

## 2019-08-10 NOTE — Telephone Encounter (Signed)
Insurance does not cover Insulin Aspart [employer] prefers unknown  Please advise.

## 2019-08-10 NOTE — Telephone Encounter (Signed)
Noted  

## 2019-08-10 NOTE — Telephone Encounter (Signed)
We can try to send Humalog pens

## 2019-08-10 NOTE — Telephone Encounter (Signed)
Humalog pens sent.

## 2019-08-10 NOTE — Addendum Note (Signed)
Addended by: Cardell Peach I on: 08/10/2019 04:59 PM   Modules accepted: Orders

## 2019-08-15 ENCOUNTER — Other Ambulatory Visit: Payer: Self-pay | Admitting: Internal Medicine

## 2019-09-02 ENCOUNTER — Other Ambulatory Visit: Payer: Self-pay | Admitting: Internal Medicine

## 2019-09-07 IMAGING — CT CT NECK W/ CM
4 of 5 series · 14 of 33 positions shown, 16 images · IV contrast (iopamidol)
Comparison: Thyroid ultrasound 01/03/2016.

CLINICAL DATA: Intermittent sensation of lumps in the neck.

EXAM:
CT NECK WITH CONTRAST
TECHNIQUE: Multidetector CT imaging of the neck was performed using the
standard protocol following the bolus administration of intravenous
contrast.
CONTRAST:  75mL [id number] IOPAMIDOL ([id number]) INJECTION 61%

[Series 2: axial neck · axial · 0.51mm/px · z∈[-168,-120]mm · 2 of 123 slices shown]
[im 25/123  bone]
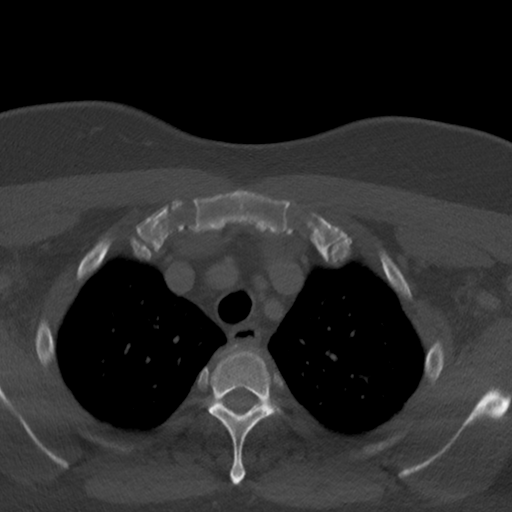
[im 49/123  bone]
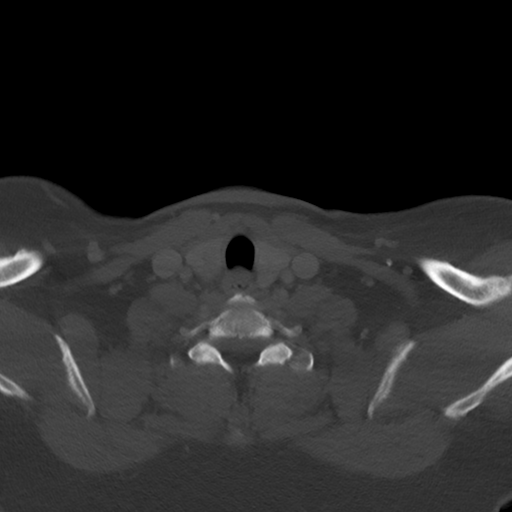

[Series 4: sag neck · sagittal · 0.44mm/px · 5 of 93 slices shown, 6 images]
[im 31/93  bone]
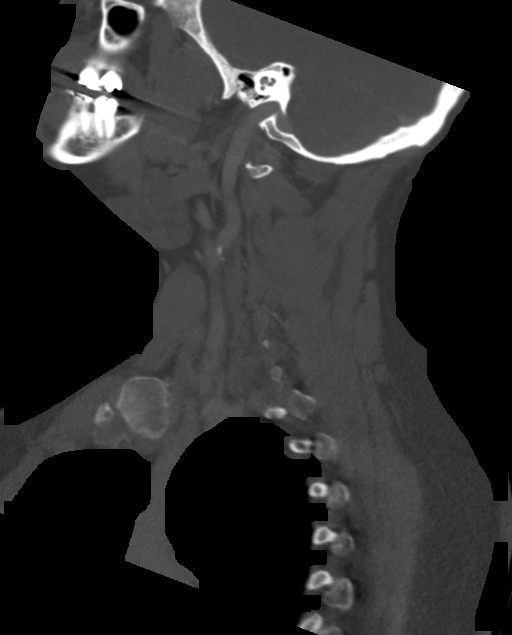
[im 39/93  bone]
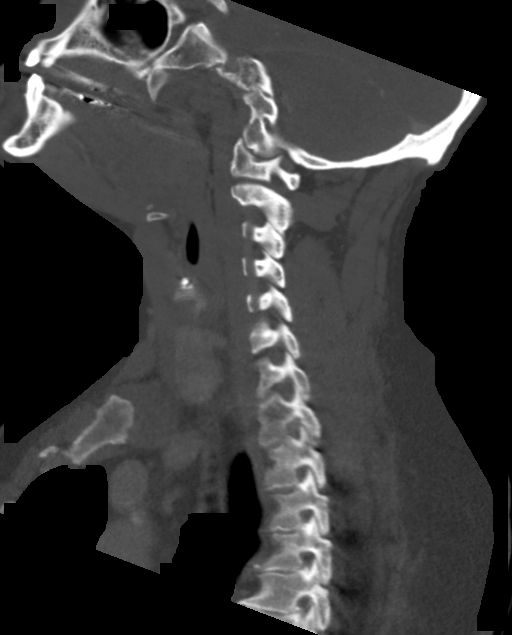
[im 47/93  soft-tissue]
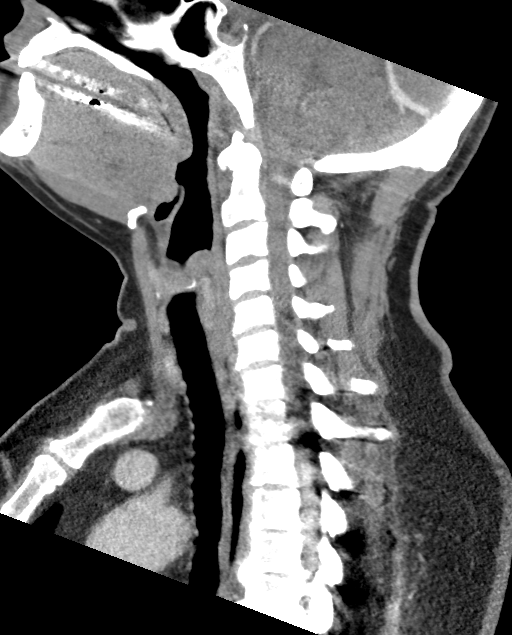
[im 47/93  bone]
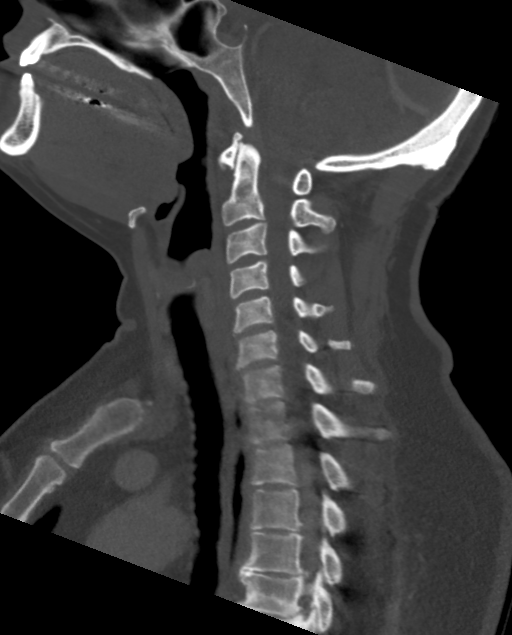
[im 54/93  bone]
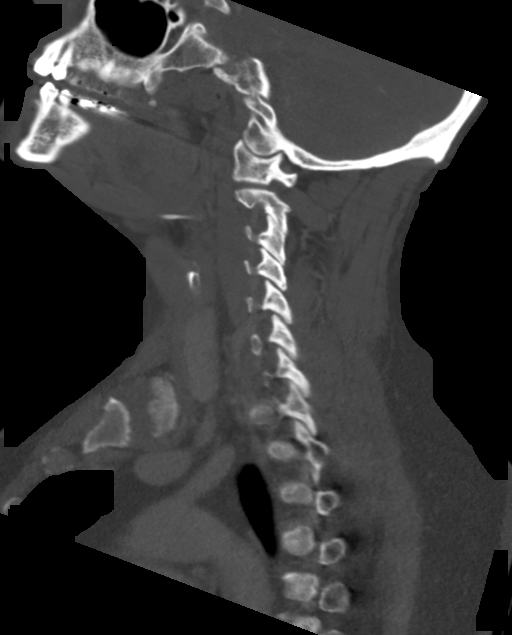
[im 62/93  bone]
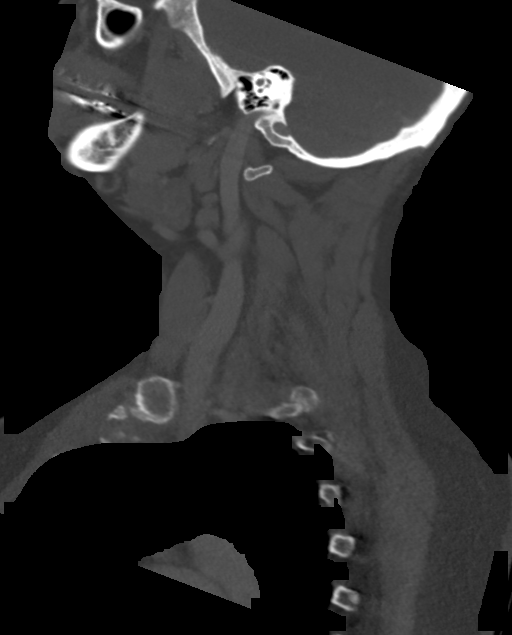

[Series 5: cor neck · coronal · 0.37mm/px · 3 of 115 slices shown]
[im 38/115  bone]
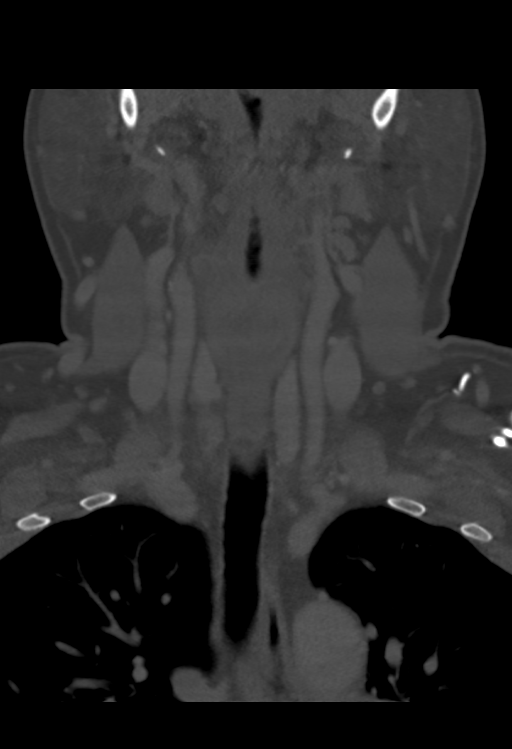
[im 51/115  bone]
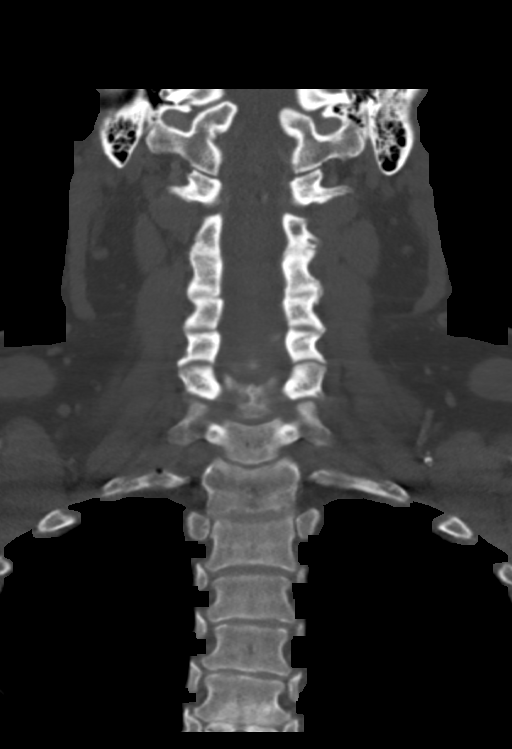
[im 64/115  bone]
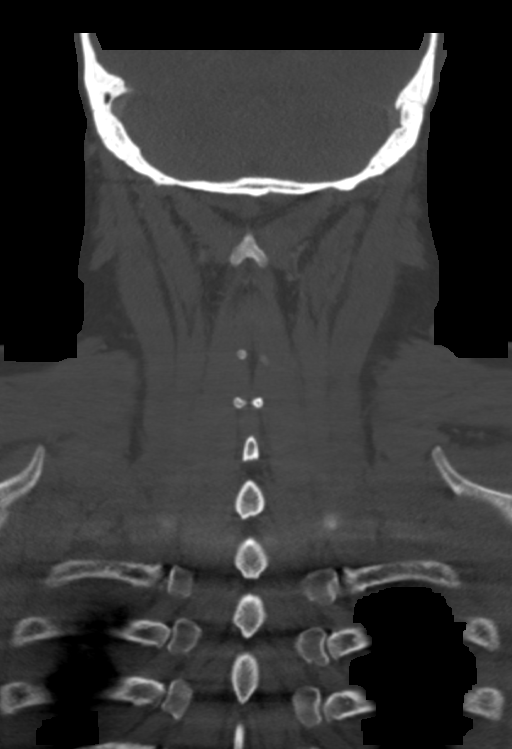

[Series 6: orthogonal ax · axial · 0.39mm/px · z∈[-201,-44]mm · 4 of 138 slices shown, 5 images]
[im 28/138  soft-tissue]
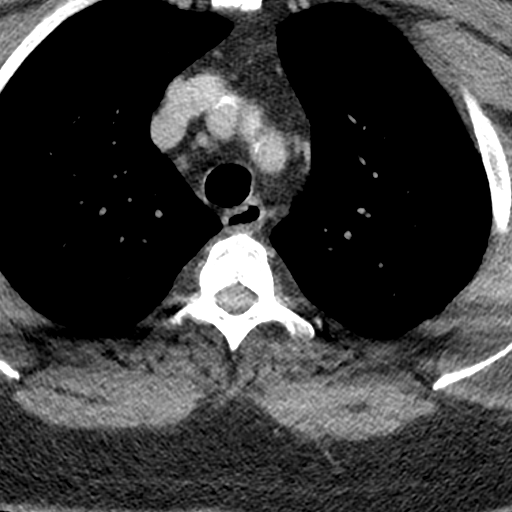
[im 28/138  bone]
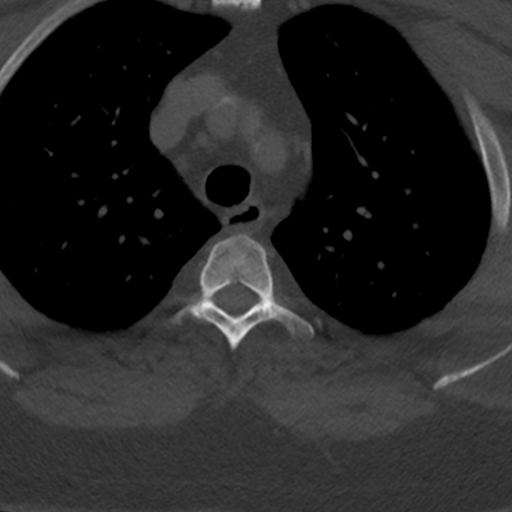
[im 55/138  bone]
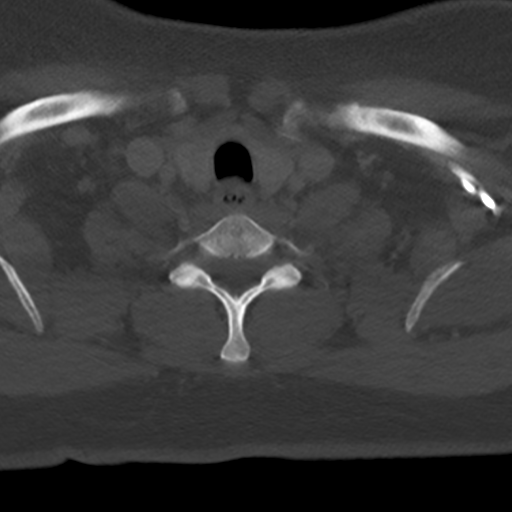
[im 83/138  bone]
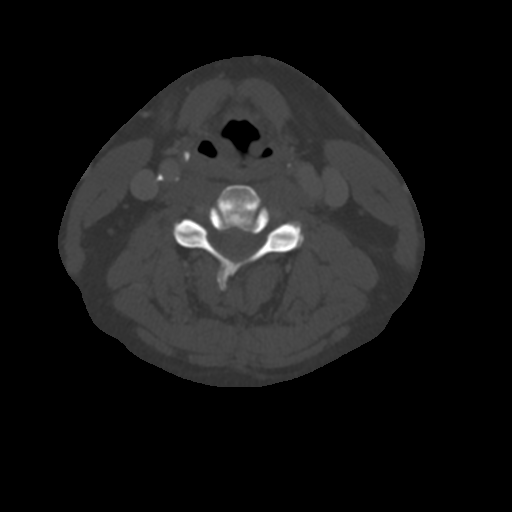
[im 110/138  bone]
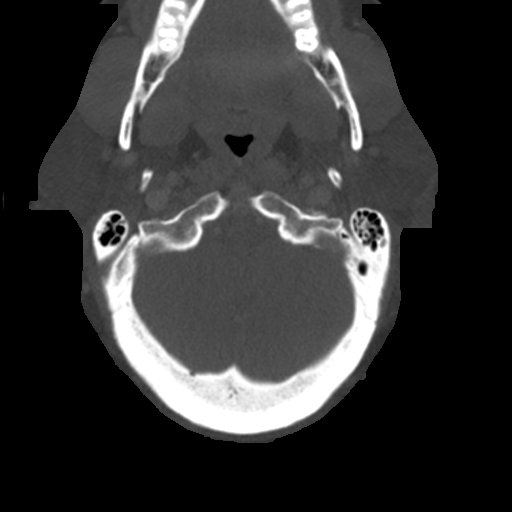

[14 of 33 positions shown; findings below may reference images not displayed]

FINDINGS: Pharynx and larynx: Slight asymmetric tissue prominence in the fossa
of Rosenmuller on the left. No other sign of mucosal asymmetry.

Salivary glands: Parotid and submandibular glands are prominent but
symmetric without evidence of a mass lesion. Punctate nonobstructing
stone in the right submandibular gland.

Thyroid: Small thyroid nodules as seen at ultrasound. No worrisome
finding by CT. Largest nodule on the right measures 1.7 cm.

Lymph nodes: No enlarged or low-density nodes on either side of the
neck. Symmetric normal appearing lymph nodes by size criteria.

Vascular: Atherosclerotic calcification at the carotid bifurcations.
Veins appear normal.

Limited intracranial: Normal

Visualized orbits: Normal

Mastoids and visualized paranasal sinuses: Normal

Skeleton: Normal

Upper chest: Normal

Other: None
IMPRESSION: No evidence neck mass or lymphadenopathy. Normal appearing bilateral
nodes.

Slight asymmetric prominence of the fossa of Rosenmuller on the left
compared to the right. This is a low suspicion finding, but direct
inspection of the nasopharynx should be considered.

## 2019-09-15 ENCOUNTER — Other Ambulatory Visit: Payer: Self-pay | Admitting: Internal Medicine

## 2019-09-28 ENCOUNTER — Telehealth: Payer: Self-pay | Admitting: Internal Medicine

## 2019-09-28 NOTE — Telephone Encounter (Signed)
Madison Cowan with [facility] in [place] ph# [phone] requests to be called re: needs the date the RX for Trulicity was written (new RX)

## 2019-12-14 ENCOUNTER — Other Ambulatory Visit: Payer: Self-pay

## 2019-12-15 ENCOUNTER — Ambulatory Visit (INDEPENDENT_AMBULATORY_CARE_PROVIDER_SITE_OTHER): Payer: BC Managed Care – PPO | Admitting: Internal Medicine

## 2019-12-15 ENCOUNTER — Encounter: Payer: Self-pay | Admitting: Internal Medicine

## 2019-12-15 VITALS — BP 150/98 | HR 95 | Ht 63.0 in | Wt 207.0 lb

## 2019-12-15 DIAGNOSIS — E1142 Type 2 diabetes mellitus with diabetic polyneuropathy: Secondary | ICD-10-CM | POA: Diagnosis not present

## 2019-12-15 DIAGNOSIS — E042 Nontoxic multinodular goiter: Secondary | ICD-10-CM

## 2019-12-15 DIAGNOSIS — E7800 Pure hypercholesterolemia, unspecified: Secondary | ICD-10-CM

## 2019-12-15 LAB — POCT GLYCOSYLATED HEMOGLOBIN (HGB A1C): Hemoglobin A1C: 7.3 % — AB (ref 4.0–5.6)

## 2019-12-15 MED ORDER — LEVEMIR FLEXTOUCH 100 UNIT/ML ~~LOC~~ SOPN: PEN_INJECTOR | SUBCUTANEOUS | 2 refills | Status: DC

## 2019-12-15 MED ORDER — TRULICITY 3 MG/0.5ML ~~LOC~~ SOAJ: 3.0000 mg | SUBCUTANEOUS | 11 refills | Status: DC

## 2019-12-15 NOTE — Progress Notes (Signed)
Patient ID: Madison Cowan, female   DOB: Jan 23, 1961, 59 y.o.   MRN: [id number]   This visit occurred during the SARS-CoV-2 public health emergency.  Safety protocols were in place, including screening questions prior to the visit, additional usage of staff PPE, and extensive cleaning of exam room while observing appropriate contact time as indicated for disinfecting solutions.   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 Last visit 4 months ago.  Before last visit, she switched to starting her work earlier and sugars improved.  However, they increased over the holidays.  DM2: Reviewed HbA1c levels: Lab Results  Component Value Date   HGBA1C 6.4 (A) 08/09/2019   HGBA1C 6.7 (A) 12/16/2018   HGBA1C 7.2 (A) 08/12/2018   She is on: - Metformin 1000 mg 2x a day with meals. - Jardiance 25 mg daily before b'fast - Trulicity 1.5 >>  weekly (could not get it from the pharmacy) - Levemir 50 units in a.m. And 70 units at bedtime  - Novolog >> Humalog 20 to 25 units before meals  Pt checks her sugars 2-4 times a day per review of her log: - am:  104-177, 203 >> 92, 113-154, 189, 206 (forgot Levemir) >> 133-180 - 2h after b'fast: 150 >> 177, 208 >>> n/c >> 76 >> n/c - before lunch: 130-196, 213 >> 152-216 >> n/c >> 123-191 - 2h after lunch: n/c >> 170, 206 >> 73 >> 175 - before dinner: 121 >> 75-138, 155 >> 76-160 >> 83-160, 180 - 2h after dinner: 89-151 >> 70, 79, 132, 282 >> 71, 225 - bedtime:72 >> 150, 161 >> n/c >> 125, 186 - nighttime: n/c >> 133 >> n/c Lowest sugar was 75 >> 70 >> 71; she has hypoglycemia awareness in the 90s Highest sugar was  216 >> 282 >> 225.  Glucometer: One Touch ultra  Pt's meals are: - Breakfast: protein shakes (6g carbs); Mc[employer] >> usually skip  - Lunch: may skip; salads cheeseburger; school cafeteria  food >> may skip - Dinner: fast food; porc chops + collard greens + salad; hamburger; McNuggets - Snacks: sometimes; sweets, nuts, chips She does not have organized mealtimes.  -+ Mild CKD, last BUN/creatinine:  Lab Results  Component Value Date   BUN 25 12/16/2018   BUN 20 10/15/2017   CREATININE 1.09 (H) 12/16/2018   CREATININE 0.92 10/15/2017   -+ HL. Last set of lipids: Lab Results  Component Value Date   CHOL 98 12/16/2018   HDL 29.30 (L) 12/16/2018   LDLCALC 44 12/16/2018   TRIG 120.0 12/16/2018   CHOLHDL 3 12/16/2018  On Crestor 10. - last eye exam was in 01/2017: No DR.  She is a glaucoma suspect. -+ Numbness, tingling, and also burning in her feet.  She continues on Lyrica, B complex, alpha lipoic acid.  She also has a history of HTN.  Thyroid nodules:  Reviewed her imaging reports and biopsies: Thyroid ultrasound (01/03/2016): Dominant nodule is a right mid lobe hypoechoic nodule of 2.1 x 0.8 x 1.2 cm, and previously measured up to 2.3 cm. There is one left thyroid nodule measuring 6 mm and several other tiny scattered nodules.  She had a benign biopsy of the dominant nodule.  Thyroid ultrasound (04/29/2017): Previously biopsied right nodule is unchanged.  She has 2 additional subcentimeter thyroid nodules, that did not require follow-up.  Also,  a hypoechoic lymph node on the right side of the neck, 1.9 cm x 0.5 cm.  Neck CT (11/19/2017): Thyroid: Small thyroid nodules as seen at ultrasound. No worrisome finding by CT. Largest nodule on the right measures 1.7 cm. Lymph nodes: No enlarged or low-density nodes on either side of the neck. Symmetric normal appearing lymph nodes by size criteria.  Pt denies: - feeling nodules in neck - hoarseness - dysphagia - choking - SOB with lying down  Latest TSH was normal: Lab Results  Component Value Date   TSH 1.69 12/16/2018   Before the coronavirus pandemic, she was meeting with friends one afternoon a month to play  board games.  ROS: Constitutional: no weight gain/no weight loss, no fatigue, no subjective hyperthermia, no subjective hypothermia Eyes: no blurry vision, no xerophthalmia ENT: no sore throat, + see HPI Cardiovascular: no CP/no SOB/no palpitations/no leg swelling Respiratory: no cough/no SOB/no wheezing Gastrointestinal: no N/no V/no D/no C/no acid reflux Musculoskeletal: no muscle aches/no joint aches Skin: no rashes, no hair loss Neurological: no tremors/no numbness/no tingling/no dizziness  I reviewed pt's medications, allergies, PMH, social hx, family hx, and changes were documented in the history of present illness. Otherwise, unchanged from my initial visit note.  Past Medical History:  Diagnosis Date  . Allergy    Zyrtec daily.  . Arthritis    DDD lumbar spine  . Asthma    Albuterol seasonally.  . Diabetes mellitus without complication ([place])   . Glaucoma   . Heart murmur   . Hyperlipidemia   . Hypertension   . Neuromuscular disorder ([place])    R third nerve palsy  . OSA (obstructive sleep apnea) 11/11/2006   CPAP  . Thyroid disease    Thyroid nodules s/p needle biopsy negative.    Also, spondylolisthesis L4-L5  Past Surgical History:  Procedure Laterality Date  . CHOLECYSTECTOMY OPEN  1979   Social History   Social History  . Marital status: Divorced    Spouse name: N/A  . 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 Smoking status: Former Smoker    Packs/day: 0.05    Years: 18.00    Types: Cigarettes    Quit date: 02/2016  . Smokeless tobacco: Never Used  . Alcohol use 0.0 oz/week     Comment: maybe 1-2 drinks per yr  . Drug use: No  . Sexual activity: No   Other Topics Concern  . 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  [place] work.      Tobacco: 1/2 pdd x 20 years.      Alcohol:  None      Drugs: none       Education: [employer].       Exercise: No.      Seatbelt: 100%      Guns: unloaded.      Sexually active: none; total partners = 5.  Last STD screen 2006.     Current Outpatient Medications on File Prior to Visit  Medication Sig Dispense Refill  . Ascorbic Acid (VITAMIN C) 100 MG tablet Take 100 mg by mouth daily.    Marland Kitchen  aspirin 325 MG EC tablet Take 325 mg by mouth daily.    Marland Kitchen b complex vitamins tablet Take 1 tablet by mouth daily.    Marland Kitchen co-enzyme Q-10 30 MG capsule Take 30 mg by mouth 3 (three) times daily.     . empagliflozin (JARDIANCE) 25 MG TABS tablet Take 25 mg by mouth daily. 90 tablet 3  . glucose blood (ONE TOUCH ULTRA TEST) test strip TEST 3-4x DAILY 300 each 3  . insulin aspart (NOVOLOG FLEXPEN) 100 UNIT/ML FlexPen Inject 15-30 Units into the skin 3 (three) times daily with meals. 45 mL 3  . Insulin Pen Needle (BD PEN NEEDLE NANO U/F) 32G X 4 MM MISC USE 5 TIMES DAILY - for insulin 450 each 3  . Insulin Syringe-Needle U-100 (BD VEO INSULIN SYRINGE U/F) 31G X 15/64" 1 ML MISC USE 3 TIMES DAILY BEFORE MEALS for insulin 300 each 3  . LEVEMIR FLEXTOUCH 100 UNIT/ML Pen INJECT 50 TO 70 UNITS UNDER THE SKIN TWO TIMES A DAY 30 pen 2  . levocetirizine (XYZAL) 5 MG tablet Take 5 mg by mouth every evening.    Marland Kitchen losartan-hydrochlorothiazide (HYZAAR) 100-12.5 MG tablet Take 1 tablet by mouth daily. Pt need new provider for refills 30 tablet 0  . metFORMIN (GLUCOPHAGE) 1000 MG tablet TAKE ONE TABLET BY MOUTH TWICE A DAY 180 tablet 2  . Multiple Vitamins-Minerals (MULTIVITAMIN WITH MINERALS) tablet Take 1 tablet by mouth daily.    . pregabalin (LYRICA) 25 MG capsule Take 1-2 capsules (25-50 mg total) by mouth at bedtime. 60 capsule 5  . rosuvastatin (CRESTOR) 10 MG tablet TAKE ONE TABLET BY MOUTH DAILY 30 tablet 0  . TRULICITY 1.5 0000000 SOPN INJECT 1.5 MG  UNDER THE SKIN ONCE WEEKLY 6 pen 2   No current facility-administered medications on file prior to visit.   No Known Allergies Family History  Problem Relation Age of Onset  . Diabetes Mother   . Hypertension Mother   . Ovarian cancer Mother 60  . Breast cancer Mother 58  . Cancer Mother 19       ovarian cancer age 55; breast cancer age 36  . Diabetes Father   . Heart disease Father 35       CAD/CABG; AAA; CHF  . Hyperlipidemia Father   . Hypertension Father   . Prostate cancer Father        dx. 13-83  . Colon polyps Father        section of colon removed; unsure of #  . Heart disease Brother        Died of CHF at age 60.  Marland Kitchen Hyperlipidemia Brother   . Hypertension Brother   . Congestive Heart Failure Brother   . Heart disease Sister 61       3-vessel CAD s/p 1 stenting; CABG  . Diabetes Sister   . Hyperlipidemia Sister   . Hypertension Sister   . Cancer Brother 83       GI- esophageal; smoker  . Heart disease Brother        MIs in 41s and CABG.  . Diabetes Brother   . Hypertension Brother   . Stroke Maternal Grandmother   . Hypertension Maternal Grandmother   . Skin cancer Maternal Grandfather        multiple - unknown types; dx. 60s  . Cancer Maternal Grandfather   . Heart Problems Paternal Grandfather   . Skin cancer Maternal Aunt        multiple - unknown  type; dx. 51s  . Pancreatic cancer Maternal Uncle        dx. 65s   PE: BP (!) 150/98   Pulse 95   Ht 5\' 3"  (1.6 m)   Wt 207 lb (93.9 kg)   SpO2 98%   BMI 36.67 kg/m  Wt Readings from Last 3 Encounters:  12/15/19 207 lb (93.9 kg)  08/09/19 207 lb (93.9 kg)  12/16/18 209 lb (94.8 kg)   Constitutional: overweight, in NAD Eyes: PERRLA, EOMI, no exophthalmos ENT: moist mucous membranes, no thyromegaly, no cervical lymphadenopathy Cardiovascular: RRR, No MRG Respiratory: CTA B Gastrointestinal: abdomen soft, NT, ND, BS+ Musculoskeletal: no deformities, strength intact in all 4 Skin: moist, warm, no  rashes Neurological: no tremor with outstretched hands, DTR normal in all 4  ASSESSMENT: 1. DM2, insulin-dependent, now controlled, with complications - Peripheral neuropathy.  2. PN  - 2/2 DM2  3. MNG  4. HL  PLAN:  1. Patient with longstanding, previously uncontrolled diabetes, with improved control lately, on a complex medication regimen, including basal/bolus insulin regimen, weekly GLP-1's Metformin, and SGLT 2 inhibitor.  Last visit, sugars are slightly better but still fluctuating due to eating meals at different times of the day or skipping insulin doses.  At that time, we increased the dose of Trulicity and decrease the dose of her insulins. -At this visit, She tells me that she could not increase the dose of Trulicity as her pharmacy did not get the higher dose.  Subsequently, she did not decrease the dose of Levemir, which was appropriate.  Sugars are slightly higher than before but they are fluctuating and she occasionally has sugars in the 70s and 80s.  This is probably related to dietary needs.  She is working on improving her diet. -I advised her to increase and gave her a refill prescription for this that she can go to another pharmacy if her pharmacy does not carry it.  We will also try to decrease the dose of Levemir for now and I advised her that she may also need to decrease the dose of Humalog in the future if sugars improve after increasing Trulicity. - I suggested to:  Patient Instructions  Please continue: - Metformin 1000 mg 2x a day with meals. - Jardiance 25 mg daily before b'fast - Novolog 20-25 units before meals  Please increase: - Trulicity 3 mcg weekly  Please decrease: - Levemir 40 units in a.m. and 50 units at bedtime  Please return in 4 months with your sugar log.   - we checked her HbA1c: 7.3% (higher) - advised to check sugars at different times of the day - 3x a day, rotating check times - Strongly advised her to schedule annual eye exam>> she  is not UTD - return to clinic in 4 months     2. MNG -Reviewed latest thyroid ultrasound report from 2019: Stable right dominant nodule, which was previously biopsied with benign results. She had a CT of the neck afterwards which did not show any worrisome nodules in her neck. -She denies neck compression symptoms -Latest TSH was reviewed and this was normal: Lab Results  Component Value Date   TSH 1.69 12/16/2018  -She had another TSH checked by PCP in the last year I will try to get the records -We can follow this expectantly for now  3. HL -Reviewed lipid panel from 12/2018: fractions at goal, with the exception of a low HDL Lab Results  Component Value Date   CHOL 98  12/16/2018   HDL 29.30 (L) 12/16/2018   LDLCALC 44 12/16/2018   TRIG 120.0 12/16/2018   CHOLHDL 3 12/16/2018  -Continues Crestor without side effects -We will need to obtain the recent lipid level from PCP  Philemon Kingdom, MD PhD [facility] Endocrinology

## 2019-12-15 NOTE — Patient Instructions (Signed)
Please continue: - Metformin 1000 mg 2x a day with meals. - Jardiance 25 mg daily before b'fast - Novolog 20-25 units before meals  Please increase: - Trulicity 3 mcg weekly  Please decrease: - Levemir 40 units in a.m. and 50 units at bedtime  Please return in 4 months with your sugar log.

## 2020-01-19 ENCOUNTER — Encounter: Payer: Self-pay | Admitting: Internal Medicine

## 2020-01-19 NOTE — Progress Notes (Signed)
Received labs from 01/14/2020: HbA1c 6.7% Lipids: 126/183/34/55 CBC with differential: No anemia CMP normal with the exception of a high glucose at 116.  BUN/creatinine 19/0.9, GFR 69 TSH 1.12 Vitamin D 16.3

## 2020-01-20 ENCOUNTER — Other Ambulatory Visit: Payer: Self-pay | Admitting: Internal Medicine

## 2020-04-20 ENCOUNTER — Other Ambulatory Visit: Payer: Self-pay

## 2020-04-20 ENCOUNTER — Ambulatory Visit (INDEPENDENT_AMBULATORY_CARE_PROVIDER_SITE_OTHER): Payer: BC Managed Care – PPO | Admitting: Internal Medicine

## 2020-04-20 ENCOUNTER — Encounter: Payer: Self-pay | Admitting: Internal Medicine

## 2020-04-20 VITALS — BP 138/80 | HR 90 | Ht 63.0 in | Wt 204.0 lb

## 2020-04-20 DIAGNOSIS — E1142 Type 2 diabetes mellitus with diabetic polyneuropathy: Secondary | ICD-10-CM

## 2020-04-20 DIAGNOSIS — E7800 Pure hypercholesterolemia, unspecified: Secondary | ICD-10-CM | POA: Diagnosis not present

## 2020-04-20 DIAGNOSIS — E042 Nontoxic multinodular goiter: Secondary | ICD-10-CM

## 2020-04-20 LAB — POCT GLYCOSYLATED HEMOGLOBIN (HGB A1C): Hemoglobin A1C: 6.8 % — AB (ref 4.0–5.6)

## 2020-04-20 MED ORDER — METFORMIN HCL 1000 MG PO TABS: 1000.0000 mg | ORAL_TABLET | Freq: Two times a day (BID) | ORAL | 3 refills | Status: DC

## 2020-04-20 NOTE — Patient Instructions (Signed)
Please continue: - Metformin 1000 mg 2x a day with meals. - Jardiance 25 mg daily before b'fast - Humalog 20-25 units before meals - Trulicity 3 mcg weekly - Levemir 40 units in a.m. and 50 units at bedtime  Please schedule an appt with Antonieta Iba with nutrition.  Please return in 4 months with your sugar log.

## 2020-04-20 NOTE — Progress Notes (Signed)
Patient ID: Madison Cowan, female   DOB: 18-Feb-1961, 59 y.o.   MRN: [id number]   This visit occurred during the SARS-CoV-2 public health emergency.  Safety protocols were in place, including screening questions prior to the visit, additional usage of staff PPE, and extensive cleaning of exam room while observing appropriate contact time as indicated for disinfecting solutions.   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 Last visit 4 months ago.  DM2: Reviewed HbA1c levels: 01/14/2020: HbA1c 6.7% Lab Results  Component Value Date   HGBA1C 7.3 (A) 12/15/2019   HGBA1C 6.4 (A) 08/09/2019   HGBA1C 6.7 (A) 12/16/2018   She is on: - Metformin 1000 mg 2x a day with meals. - Jardiance 25 mg daily before b'fast - Trulicity 1.5 >> 3 mg weekly - Levemir 50 units in a.m. And 70 units at bedtime >> 40 units in a.m. and 50 units at bedtime - Novolog >> Humalog 20-25 units before meals  Pt checks her sugars  to 4 times a day per review of her log: - am:   92, 113-154, 189, 206 >> 133-180 >> 134, 145-189 - 2h after b'fast: 150 >> 177, 208 >>> n/c >> 76 >> n/c - before lunch: 152-216 >> n/c >> 123-191 >> 115, 158 - 2h after lunch: n/c >> 170, 206 >> 73 >> 175 >> 175, 184, 210 - before dinner:  76-160 >> 83-160, 180 >> 82, 110-130, 165 - 2h after dinner: 89-151 >> 70, 79, 132, 282 >> 71, 225 >> 183 - bedtime:72 >> 150, 161 >> n/c >> 125, 186 - nighttime: n/c >> 133 >> n/c Lowest sugar was 75 >> 70 >> 71 >> 82; she has hypoglycemia awareness in the 90s Highest sugar was  216 >> 280s >> 189. Glucometer: One Touch ultra  Pt's meals are: - Breakfast: protein shakes (6g carbs); McDonads >> usually skip  - Lunch: may skip; salads cheeseburger; school cafeteria food >> may skip - Dinner: fast food; porc chops + collard greens + salad; hamburger;  McNuggets - Snacks: sometimes; sweets, nuts, chips She does not have organized mealtimes.  -+ Mild CKD, last BUN/creatinine:  01/14/2020: CMP normal with the exception of a high glucose at 116.  BUN/creatinine 19/0.9, GFR 69 Lab Results  Component Value Date   BUN 25 12/16/2018   BUN 20 10/15/2017   CREATININE 1.09 (H) 12/16/2018   CREATININE 0.92 10/15/2017   -+ HL. Last set of lipids: 01/14/2020: 126/183/34/55 Lab Results  Component Value Date   CHOL 98 12/16/2018   HDL 29.30 (L) 12/16/2018   LDLCALC 44 12/16/2018   TRIG 120.0 12/16/2018   CHOLHDL 3 12/16/2018  On Crestor 10. - last eye exam was in 02/2020: No DR.  She is a glaucoma suspect. -+ Numbness, tingling, and also burning in her feet.  She continues on Lyrica, B complex, alpha lipoic acid.  She also has HTN.  Thyroid nodules:  Reviewed her imaging reports and biopsies: Thyroid ultrasound (01/03/2016): Dominant nodule is a right mid lobe hypoechoic nodule of 2.1 x 0.8 x 1.2 cm, and previously measured up to 2.3 cm. There is one left thyroid nodule measuring 6 mm and several other tiny scattered nodules.  She had a benign biopsy of the dominant nodule.  Thyroid ultrasound (04/29/2017): Previously biopsied right nodule is unchanged.  She has 2 additional subcentimeter thyroid nodules,  that did not require follow-up.  Also, a hypoechoic lymph node on the right side of the neck, 1.9 cm x 0.5 cm.  Neck CT (11/19/2017): Thyroid: Small thyroid nodules as seen at ultrasound. No worrisome finding by CT. Largest nodule on the right measures 1.7 cm. Lymph nodes: No enlarged or low-density nodes on either side of the neck. Symmetric normal appearing lymph nodes by size criteria.  Pt denies: - feeling nodules in neck - hoarseness - dysphagia - choking - SOB with lying down  Latest TSH was normal: 01/14/2020: TSH 1.12 Lab Results  Component Value Date   TSH 1.69 12/16/2018   Before the coronavirus pandemic, she was meeting  with friends one afternoon a month to play board games.  ROS: Constitutional: no weight gain/no weight loss, no fatigue, no subjective hyperthermia, no subjective hypothermia Eyes: no blurry vision, no xerophthalmia ENT: no sore throat, + see HPI Cardiovascular: no CP/no SOB/no palpitations/no leg swelling Respiratory: no cough/no SOB/no wheezing Gastrointestinal: no N/no V/no D/no C/no acid reflux Musculoskeletal: no muscle aches/no joint aches Skin: no rashes, no hair loss Neurological: no tremors/no numbness/no tingling/no dizziness  I reviewed pt's medications, allergies, PMH, social hx, family hx, and changes were documented in the history of present illness. Otherwise, unchanged from my initial visit note.  Past Medical History:  Diagnosis Date  . Allergy    Zyrtec daily.  . Arthritis    DDD lumbar spine  . Asthma    Albuterol seasonally.  . Diabetes mellitus without complication ([place])   . Glaucoma   . Heart murmur   . Hyperlipidemia   . Hypertension   . Neuromuscular disorder ([place])    R third nerve palsy  . OSA (obstructive sleep apnea) 11/11/2006   CPAP  . Thyroid disease    Thyroid nodules s/p needle biopsy negative.    Also, spondylolisthesis L4-L5  Past Surgical History:  Procedure Laterality Date  . CHOLECYSTECTOMY OPEN  1979   Social History   Social History  . Marital status: Divorced    Spouse name: N/A  . 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 Smoking status: Former Smoker    Packs/day: 0.05    Years: 18.00    Types: Cigarettes    Quit date: 02/2016  . Smokeless tobacco: Never Used  . Alcohol use 0.0 oz/week     Comment: maybe 1-2 drinks per yr  . Drug use: No  . Sexual activity: No   Other Topics Concern  . 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  [place] work.      Tobacco: 1/2 pdd x 20 years.      Alcohol:  None      Drugs: none       Education: [employer].       Exercise: No.      Seatbelt: 100%      Guns: unloaded.      Sexually active: none; total partners = 5.  Last STD screen 2006.     Current Outpatient Medications on File Prior to Visit  Medication Sig Dispense Refill  . Ascorbic Acid (VITAMIN C) 100 MG tablet  Take 100 mg by mouth daily.    Marland Kitchen aspirin 325 MG EC tablet Take 325 mg by mouth daily.    Marland Kitchen b complex vitamins tablet Take 1 tablet by mouth daily.    . Dulaglutide (TRULICITY) 3 TG/2.5WL SOPN Inject 3 mg into the skin once a week. 4 pen 11  . empagliflozin (JARDIANCE) 25 MG TABS tablet Take 25 mg by mouth daily. 90 tablet 3  . glucose blood (ONE TOUCH ULTRA TEST) test strip TEST 3-4x DAILY 300 each 3  . insulin aspart (NOVOLOG FLEXPEN) 100 UNIT/ML FlexPen Inject 15-30 Units into the skin 3 (three) times daily with meals. 45 mL 3  . insulin detemir (LEVEMIR FLEXTOUCH) 100 UNIT/ML FlexPen Inject 40 units in the morning and 50 units in the evening. 81 mL 1  . Insulin Pen Needle (BD PEN NEEDLE NANO U/F) 32G X 4 MM MISC USE 5 TIMES DAILY - for insulin 450 each 3  . levocetirizine (XYZAL) 5 MG tablet Take 5 mg by mouth every evening.    Marland Kitchen losartan-hydrochlorothiazide (HYZAAR) 100-12.5 MG tablet Take 1 tablet by mouth daily. Pt need new provider for refills 30 tablet 0  . metFORMIN (GLUCOPHAGE) 1000 MG tablet TAKE ONE TABLET BY MOUTH TWICE A DAY 180 tablet 2  . Multiple Vitamins-Minerals (MULTIVITAMIN WITH MINERALS) tablet Take 1 tablet by mouth daily.    . pregabalin (LYRICA) 25 MG capsule Take 1-2 capsules (25-50 mg total) by mouth at bedtime. 60 capsule 5  . rosuvastatin (CRESTOR) 10 MG tablet TAKE ONE TABLET BY MOUTH DAILY 30 tablet 0   No current facility-administered medications on file prior to visit.   No Known  Allergies Family History  Problem Relation Age of Onset  . Diabetes Mother   . Hypertension Mother   . Ovarian cancer Mother 9  . Breast cancer Mother 32  . Cancer Mother 78       ovarian cancer age 53; breast cancer age 13  . Diabetes Father   . Heart disease Father 72       CAD/CABG; AAA; CHF  . Hyperlipidemia Father   . Hypertension Father   . Prostate cancer Father        dx. 60-83  . Colon polyps Father        section of colon removed; unsure of #  . Heart disease Brother        Died of CHF at age 64.  Marland Kitchen Hyperlipidemia Brother   . Hypertension Brother   . Congestive Heart Failure Brother   . Heart disease Sister 82       3-vessel CAD s/p 1 stenting; CABG  . Diabetes Sister   . Hyperlipidemia Sister   . Hypertension Sister   . Cancer Brother 29       GI- esophageal; smoker  . Heart disease Brother        MIs in 42s and CABG.  . Diabetes Brother   . Hypertension Brother   . Stroke Maternal Grandmother   . Hypertension Maternal Grandmother   . Skin cancer Maternal Grandfather        multiple - unknown types; dx. 8s  . Cancer Maternal Grandfather   . Heart Problems Paternal Grandfather   . Skin cancer Maternal Aunt        multiple - unknown type; dx. 35s  . Pancreatic cancer Maternal Uncle        dx. 101s   PE: BP 138/80   Pulse 90   Ht 5\' 3"  (  1.6 m)   Wt 204 lb (92.5 kg)   SpO2 96%   BMI 36.14 kg/m  Wt Readings from Last 3 Encounters:  04/20/20 204 lb (92.5 kg)  12/15/19 207 lb (93.9 kg)  08/09/19 207 lb (93.9 kg)   Constitutional: overweight, in NAD Eyes: PERRLA, EOMI, no exophthalmos ENT: moist mucous membranes, no thyromegaly, no cervical lymphadenopathy Cardiovascular: RRR, No MRG Respiratory: CTA B Gastrointestinal: abdomen soft, NT, ND, BS+ Musculoskeletal: no deformities, strength intact in all 4 Skin: moist, warm, no rashes Neurological: no tremor with outstretched hands, DTR normal in all 4  ASSESSMENT: 1. DM2, insulin-dependent, now  controlled, with complications - Peripheral neuropathy.  2. PN  - 2/2 DM2  3. MNG  4. HL  PLAN:  1. Patient with longstanding, previously uncontrolled diabetes, with improved control lately, on a complex medication regimen including basal-bolus insulin regimen, weekly GLP-1 receptor agonist (which we increased at last visit), Metformin, and SGLT 2 inhibitor.  At last visit, sugars were slightly higher than before but still fluctuating and she occasionally has sugars in the 70s and 80s.  She was working on improving diet.  At that time, I again advised her to increase Trulicity to 3 mg  weekly, and we decreased the Levemir dose.  At last check, in 01/2020, HbA1c was much better, at 6.7%! -At this visit, sugars are still above target especially in the morning and occasionally during the day, usually due to dietary indiscretions are missing insulin doses.  Also, she is not checking sugars consistently and we discussed about the importance of doing so.  She has quite a few dietary indiscretions and we discussed that it would be extremely important to improve her diet to gain further control of her diabetes.  She does accept a referral to nutrition at this point. She mentions that she does know what she is supposed to eat, but I feel she needs more advice in implementing healthier dietary habits. -For now, I do not feel we need to change her regimen but will focus on improving her diet.  If sugars are still not improved at next visit, may need an increase in insulin doses or Trulicity. - I suggested to:  Patient Instructions  Please continue: - Metformin 1000 mg 2x a day with meals. - Jardiance 25 mg daily before b'fast - Humalog 20-25 units before meals - Trulicity 3 mcg weekly - Levemir 40 units in a.m. and 50 units at bedtime  Please schedule an appt with Antonieta Iba with nutrition.  Please return in 4 months with your sugar log.   - we checked her HbA1c: 6.8% (slightly higher)  - advised to  check sugars at different times of the day - 4x a day, rotating check times - advised for yearly eye exams >> she is UTD - return to clinic in 4 months    2. MNG -Reviewed latest thyroid ultrasound report from 2019: Stable right dominant nodule, which was previously biopsied with benign results. She had a CT of the neck afterwards which did not show any worrisome nodules in her neck. -No neck compression symptoms -Latest TSH from 01/14/2020 was reviewed and this was normal: TSH 1.12 -We will continue to follow her clinically for now  3. HL -Reviewed latest lipid panel from 01/14/2020: LDL at goal, triglycerides slightly high, HDL slightly low:126/183/34/55 -Continues Crestor without side effects.  Dietary changes were also help.  Philemon Kingdom, MD PhD [facility]

## 2020-04-20 NOTE — Addendum Note (Signed)
Addended by: Cardell Peach I on: 04/20/2020 09:46 AM   Modules accepted: Orders

## 2020-05-03 ENCOUNTER — Encounter: Payer: BC Managed Care – PPO | Attending: Family Medicine | Admitting: Nutrition

## 2020-05-03 ENCOUNTER — Other Ambulatory Visit: Payer: Self-pay

## 2020-05-03 DIAGNOSIS — E1142 Type 2 diabetes mellitus with diabetic polyneuropathy: Secondary | ICD-10-CM

## 2020-05-04 NOTE — Progress Notes (Signed)
Patient is here because she has not been following her diet during Covid, and had gained 10 pounds, and blood sugars had risen.  She has since lost 6 pounds, but wants to loose more.  In last 2 weeks she has made a conscious effort to write foods down, and limit carbohydrates to help with food quantities and blood sugar control. Says it is working for her, but wants to review this with me for suggestions. Medications: Levemir: 40AM                                        50HS(9PM)                     Trulicity: 30mcg once a week                     Novolog 20-25u ac meals  Says forgets novolog about 20% of time, due to busy schedule.  But now is home [employer]) and does not forget) SBGM:  Ac and 2hr. pc some meals        FBSs: 102 today and have been coming down since following meal plan.  Denies lows during the night/fasting        ACL:  70-120.  AcS: 80-130,        2 hours pc:  150-180, checking once daily at sporatic meals.   Low blood sugars: 2 lows "in the low 70s when she skips breakfast, without activity. Exercise:  None at this time.  Keeps busy during the day with home tasks, but no planned exercis.  Typical day: 10AM: up FBS today was 102.  Water with lemon 11AM:  Bfast: 20u Novolog  Bread (20 carbs) with butter and cinnamon and sugar), 1/2 cup cottage cheese, apple Or 1 egg, bread (20 carbs),fruit 1-3PM:  snack of 1/4c trail mix with 3 gm of carb 2-4PM: lunch 20u Novolog Blood sugar was 86 yesterdy:  Sometimes sandwich with fruit, some times leftovers from supper the night before ("meat, with small amou,t of carbs and veg".) 7-9PM: 25u Pasta with meat sauce, or 4-5 ounce protein-baked chicken, fish or other protein with one starchy veg (15-39 grams of carb), and non starchy veg.  Ususally no bread.   10-11.  Tries to eat nothing after supper, but occasional snack of less than 10 grams of carb. 11PM: sleep  Suggestions given. Praise given for keeping food log/ blood sugar log and weight  lost (9 pounds since Feb. 5th.), and  for dietary changes made.  1. Told her that she is consuming approximately 1600 calories and that she will need to reduce this more if she does not start an exercise program.  Suggested 10 min. TID with suggestions given for this.  Discussed how exercise can stimulate metabolism, increasing calories burned per minute after the exercise is done.   2.If she chooses not to exercise, suggested ways she can reduce calories by reducing fats in her diet, by reducing meat portions sizes and fats used while cooking and eating.  Also suggested ways she can reduce fat once school starts and she will be eating out more.   3.  Discussed the need to reduce insulin doses when blood sugars drop into the 70s at curtain times during the day.  When FBSs drop, she will reduce her PM dose of Levemir (5u), when dropping low acL, reduce Novolog  dose acB(2u), when dropping low acS a(Novolog dose acL(2u), and HS Novolog dose acS (2u).  Discussed the importance of this, to prevent hunger and need to consume more calories.  She reported good understanding of this.  She reported good understanding of all of the above had no final questions.  She requested another appointment in 4 weeks to help with motivation.  Appointment scheduled.

## 2020-05-07 NOTE — Patient Instructions (Signed)
Exercise by walking or treadmill for 10 min. 3X/day Reduce fat in the meals by using less oil/butter/sour cream/cream cheese, salad dressings, ordering dressings/fat on the side when eating out. Adjust insulin dose as directed if blood sugars drop low 2X/wk at certain times, if no reason for this.  If in doubt as to what dose to adjust, call doctor's office Return 1 month.

## 2020-05-09 ENCOUNTER — Other Ambulatory Visit: Payer: Self-pay | Admitting: Internal Medicine

## 2020-06-12 ENCOUNTER — Ambulatory Visit: Payer: BC Managed Care – PPO | Admitting: Nutrition

## 2020-06-17 ENCOUNTER — Other Ambulatory Visit: Payer: Self-pay | Admitting: Internal Medicine

## 2020-07-26 ENCOUNTER — Other Ambulatory Visit: Payer: Self-pay | Admitting: Internal Medicine

## 2020-08-18 ENCOUNTER — Other Ambulatory Visit: Payer: Self-pay

## 2020-08-21 ENCOUNTER — Other Ambulatory Visit: Payer: Self-pay | Admitting: Internal Medicine

## 2020-08-23 ENCOUNTER — Encounter: Payer: Self-pay | Admitting: Internal Medicine

## 2020-08-23 ENCOUNTER — Telehealth (INDEPENDENT_AMBULATORY_CARE_PROVIDER_SITE_OTHER): Payer: BC Managed Care – PPO | Admitting: Internal Medicine

## 2020-08-23 VITALS — Ht 63.0 in | Wt 198.0 lb

## 2020-08-23 DIAGNOSIS — E7800 Pure hypercholesterolemia, unspecified: Secondary | ICD-10-CM | POA: Diagnosis not present

## 2020-08-23 DIAGNOSIS — E1142 Type 2 diabetes mellitus with diabetic polyneuropathy: Secondary | ICD-10-CM | POA: Diagnosis not present

## 2020-08-23 DIAGNOSIS — E042 Nontoxic multinodular goiter: Secondary | ICD-10-CM | POA: Diagnosis not present

## 2020-08-23 NOTE — Patient Instructions (Addendum)
Please continue: - Metformin 1000 mg 2x a day with meals. - Jardiance 25 mg daily before b'fast - Trulicity 3 mcg weekly - Levemir 40 units in a.m. and 50 units at bedtime  Try to use: - Humalog 17-20 (25) units before meals  Please return in 4 months with your sugar log.

## 2020-08-23 NOTE — Progress Notes (Signed)
Patient ID: Naylin Burkle, female   DOB: 23-Oct-1961, 59 y.o.   MRN: [id number]   Patient location: Home My location: Office Persons participating in the virtual visit: patient, provider  Referring Provider: Hayden Rasmussen, MD  I connected with the patient on 08/23/20 at 10:28 AM EDT by a video enabled telemedicine application and verified that I am speaking with the correct person.   I discussed the limitations of evaluation and management by telemedicine and the availability of in person appointments. The patient expressed understanding and agreed to proceed.   Details of the encounter are shown below.  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 Last visit 4 months ago.  DM2: Reviewed HbA1c levels: Lab Results  Component Value Date   HGBA1C 6.8 (A) 04/20/2020   HGBA1C 7.3 (A) 12/15/2019   HGBA1C 6.4 (A) 08/09/2019  01/14/2020: HbA1c 6.7%  She is on: - Metformin 1000 mg 2x a day with meals. - Jardiance 25 mg daily before b'fast - Trulicity 1.5 >> 3 mg weekly - Levemir 50 units in a.m. And 70 units at bedtime >> 40 units in a.m. and 50 units at bedtime - Novolog >> Humalog 20-25 units before meals  Pt checks her sugars 4 times a day: - am:   92, 113-154, 189, 206 >> 133-180 >> 134, 145-189 >> 91-130s - 2h after b'fast: 150 >> 177, 208 >>> n/c >> 76 >> n/c >> 131-144, 220 - before lunch: 152-216 >> n/c >> 123-191 >> 115, 158 >> see above (early lunch) - 2h after lunch: 170, 206 >> 73 >> 175 >> 175, 184, 210 >> 65, 70-120, 144 - before dinner:  76-160 >> 83-160, 180 >> 82, 110-130, 165 >> 92-111, 163 - 2h after dinner: 70, 79, 132, 282 >> 71, 225 >> 183 >> n/c - bedtime: 72 >> 150, 161 >> n/c >> 125, 186 >> 70, 95 - nighttime: n/c >> 133 >> n/c Lowest sugar was 71 >> 82 >> 65 (2h after lunch); she has hypoglycemia  awareness in the 90s. Highest sugar was  280s >> 189 >> 220 (am- forgot meds the night before).  Glucometer: One Touch ultra  Pt's meals are: - Breakfast: protein shakes (6g carbs); McDonads >> usually skip  - Lunch: may skip; salads cheeseburger; school cafeteria food >> may skip - Dinner: fast food; porc chops + collard greens + salad; hamburger; McNuggets - Snacks: sometimes; sweets, nuts, chips She does not have organized mealtimes.  -+ Mild CKD, last BUN/creatinine:  01/14/2020: CMP normal with the exception of a high glucose at 116.  BUN/creatinine 19/0.9, GFR 69 Lab Results  Component Value Date   BUN 25 12/16/2018   BUN 20 10/15/2017   CREATININE 1.09 (H) 12/16/2018   CREATININE 0.92 10/15/2017   -+ HL. Last set of lipids: 01/14/2020: 126/183/34/55 Lab Results  Component Value Date   CHOL 98 12/16/2018   HDL 29.30 (L) 12/16/2018   LDLCALC 44 12/16/2018   TRIG 120.0 12/16/2018   CHOLHDL 3 12/16/2018  On Crestor 10. - last eye exam was in 02/2020: No DR.  She is a glaucoma suspect. -she has Numbness, tingling, and also burning in her feet.  She continues on Lyrica, B complex, alpha lipoic acid.  She also has a history of HTN  Thyroid nodules:  Reviewed her imaging reports and biopsies: Thyroid ultrasound (01/03/2016): Dominant  nodule is a right mid lobe hypoechoic nodule of 2.1 x 0.8 x 1.2 cm, and previously measured up to 2.3 cm. There is one left thyroid nodule measuring 6 mm and several other tiny scattered nodules.  She had a benign biopsy of the dominant nodule.  Thyroid ultrasound (04/29/2017): Previously biopsied right nodule is unchanged.  She has 2 additional subcentimeter thyroid nodules, that did not require follow-up.  Also, a hypoechoic lymph node on the right side of the neck, 1.9 cm x 0.5 cm.  Neck CT (11/19/2017): Thyroid: Small thyroid nodules as seen at ultrasound. No worrisome finding by CT. Largest nodule on the right measures 1.7 cm. Lymph nodes: No  enlarged or low-density nodes on either side of the neck. Symmetric normal appearing lymph nodes by size criteria.  Pt denies: - feeling nodules in neck - hoarseness - dysphagia - choking - SOB with lying down  Review latest TFTs: 01/14/2020: TSH 1.12 Lab Results  Component Value Date   TSH 1.69 12/16/2018   Before the coronavirus pandemic, she was meeting with friends one afternoon a month to play board games.  ROS: Constitutional: no weight gain/no weight loss, no fatigue, no subjective hyperthermia, no subjective hypothermia Eyes: no blurry vision, no xerophthalmia ENT: no sore throat, + see HPI Cardiovascular: no CP/no SOB/no palpitations/no leg swelling Respiratory: no cough/no SOB/no wheezing Gastrointestinal: no N/no V/no D/no C/no acid reflux Musculoskeletal: no muscle aches/no joint aches Skin: no rashes, no hair loss Neurological: no tremors/no numbness/no tingling/no dizziness  I reviewed pt's medications, allergies, PMH, social hx, family hx, and changes were documented in the history of present illness. Otherwise, unchanged from my initial visit note.  Past Medical History:  Diagnosis Date  . Allergy    Zyrtec daily.  . Arthritis    DDD lumbar spine  . Asthma    Albuterol seasonally.  . Diabetes mellitus without complication ([place])   . Glaucoma   . Heart murmur   . Hyperlipidemia   . Hypertension   . Neuromuscular disorder ([place])    R third nerve palsy  . OSA (obstructive sleep apnea) 11/11/2006   CPAP  . Thyroid disease    Thyroid nodules s/p needle biopsy negative.    Also, spondylolisthesis L4-L5  Past Surgical History:  Procedure Laterality Date  . CHOLECYSTECTOMY OPEN  1979   Social History   Social History  . Marital status: Divorced    Spouse name: N/A  . 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 Smoking status: Former Smoker    Packs/day: 0.05    Years: 18.00    Types:  Cigarettes    Quit date: 02/2016  . Smokeless tobacco: Never Used  . Alcohol use 0.0 oz/week     Comment: maybe 1-2 drinks per yr  . Drug use: No  . Sexual activity: No   Other Topics Concern  . 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  [employer] work.      Tobacco: 1/2 pdd x 20 years.      Alcohol:  None      Drugs: none       Education: [employer].  Exercise: No.      Seatbelt: 100%      Guns: unloaded.      Sexually active: none; total partners = 5.  Last STD screen 2006.     Current Outpatient Medications on File Prior to Visit  Medication Sig Dispense Refill  . Ascorbic Acid (VITAMIN C) 100 MG tablet Take 100 mg by mouth daily.    Marland Kitchen aspirin 325 MG EC tablet Take 325 mg by mouth daily.    Marland Kitchen b complex vitamins tablet Take 1 tablet by mouth daily.    . empagliflozin (JARDIANCE) 25 MG TABS tablet Take 25 mg by mouth daily. 90 tablet 3  . insulin detemir (LEVEMIR FLEXTOUCH) 100 UNIT/ML FlexPen INJECT 40 UNITS INTO THE SKIN IN THE MORNING AND 50 UNITS INTO THE SKIN IN THE EVENING 81 mL 0  . Insulin Pen Needle (BD PEN NEEDLE NANO U/F) 32G X 4 MM MISC USE 5 TIMES DAILY FOR INSULIN 200 each 3  . levocetirizine (XYZAL) 5 MG tablet Take 5 mg by mouth every evening.    Marland Kitchen losartan-hydrochlorothiazide (HYZAAR) 100-12.5 MG tablet Take 1 tablet by mouth daily. Pt need new provider for refills 30 tablet 0  . metFORMIN (GLUCOPHAGE) 1000 MG tablet Take 1 tablet (1,000 mg total) by mouth 2 (two) times daily. 180 tablet 3  . Multiple Vitamins-Minerals (MULTIVITAMIN WITH MINERALS) tablet Take 1 tablet by mouth daily.    Marland Kitchen NOVOLOG FLEXPEN 100 UNIT/ML FlexPen INJECT 15-30 UNITS INTO THE SKIN THREE TIMES A DAY WITH MEALS 81 mL 1  . ONETOUCH ULTRA test strip USE TO TEST BLOOD SUGAR THREE TO FOUR  TIMES DAILY 300 strip 2  . pregabalin (LYRICA) 25 MG capsule Take 1-2 capsules (25-50 mg total) by mouth at bedtime. 60 capsule 5  . rosuvastatin (CRESTOR) 10 MG tablet TAKE ONE TABLET BY MOUTH DAILY 30 tablet 0  . TRULICITY 3 HC/6.2BJ SOPN INJECT 3 MG (0.5 MLS) UNDER THE SKIN ONCE WEEKLY 2 mL 1   No current facility-administered medications on file prior to visit.   No Known Allergies Family History  Problem Relation Age of Onset  . Diabetes Mother   . Hypertension Mother   . Ovarian cancer Mother 53  . Breast cancer Mother 33  . Cancer Mother 67       ovarian cancer age 67; breast cancer age 38  . Diabetes Father   . Heart disease Father 72       CAD/CABG; AAA; CHF  . Hyperlipidemia Father   . Hypertension Father   . Prostate cancer Father        dx. 7-83  . Colon polyps Father        section of colon removed; unsure of #  . Heart disease Brother        Died of CHF at age 60.  Marland Kitchen Hyperlipidemia Brother   . Hypertension Brother   . Congestive Heart Failure Brother   . Heart disease Sister 46       3-vessel CAD s/p 1 stenting; CABG  . Diabetes Sister   . Hyperlipidemia Sister   . Hypertension Sister   . Cancer Brother 40       GI- esophageal; smoker  . Heart disease Brother        MIs in 71s and CABG.  . Diabetes Brother   . Hypertension Brother   . Stroke Maternal Grandmother   . Hypertension Maternal Grandmother   . Skin cancer Maternal Grandfather  multiple - unknown types; dx. 54s  . Cancer Maternal Grandfather   . Heart Problems Paternal Grandfather   . Skin cancer Maternal Aunt        multiple - unknown type; dx. 38s  . Pancreatic cancer Maternal Uncle        dx. 70s   PE: There were no vitals taken for this visit. Wt Readings from Last 3 Encounters:  05/04/20 200 lb 11.2 oz (91 kg)  04/20/20 204 lb (92.5 kg)  12/15/19 207 lb (93.9 kg)   Constitutional:  in NAD  The physical exam was not performed (virtual visit).  ASSESSMENT: 1. DM2,  insulin-dependent, now controlled, with complications - Peripheral neuropathy.  2. MNG  3. HL  PLAN:  1. Patient with longstanding, previously controlled type 2 diabetes, with improved control lately, on a complex medication regimen including basal-bolus insulin, weekly GLP-1 receptor agonist, Metformin, SGLT2 inhibitor.  At last visit, sugars were still above target especially in the morning and occasionally later in the day, usually due to dietary indiscretions and missing insulin doses.  Also, she was not checking sugars consistently and we discussed about the importance of doing so.  We again discussed about the importance of limiting her dietary indiscretions.  I referred her to nutrition at last visit and she had an appointment with Leonia Reader.  We did not change her regimen pending changes in her dietary habits.  HbA1c was still at goal, at 6.8%, but slightly higher. -At today's visit, sugars improved, with only few exceptions due to dietary indiscretions or forgetting her medicines.  The highest blood sugar was in the 220s. -However, the rest of the labs appear at goal or even in the hypoglycemia range especially after meals.  Therefore, I advised her to use lower doses of Humalog now that she is improving her diet.  Otherwise, we can continue the rest of the regimen but we may need to decrease the dose of her Levemir at next visit. - I suggested to:  Patient Instructions  Please continue: - Metformin 1000 mg 2x a day with meals. - Jardiance 25 mg daily before b'fast - Trulicity 3 mcg weekly - Levemir 40 units in a.m. and 50 units at bedtime  Try to use: - Humalog 17-20 (25) units before meals  Please return in 4 months with your sugar log.   - we will check her HbA1c when she returns to the clinic - advised to check sugars at different times of the day - 4x a day, rotating check times - advised for yearly eye exams >> she is UTD - return to clinic in 4 months   2.  MNG -Reviewed the latest thyroid ultrasound report from 2019: Stable right dominant nodule, which was previously biopsied with benign results.  She had a neck CT afterwards which did not show any worrisome nodules in her neck -She denies neck compression symptoms -Reviewed latest TSH from 01/2020 and this was normal -We will continue to follow her clinically for now  3. HL -Reviewed latest lipid panel from 01/14/2020: LDL at goal, triglycerides slightly high, HDL slightly low:126/183/34/55 -Continues Crestor 10 without side effects  Philemon Kingdom, MD PhD [facility] End[facility]

## 2020-10-01 ENCOUNTER — Other Ambulatory Visit: Payer: Self-pay | Admitting: Internal Medicine

## 2020-10-15 ENCOUNTER — Other Ambulatory Visit: Payer: Self-pay | Admitting: Internal Medicine

## 2020-11-09 ENCOUNTER — Other Ambulatory Visit: Payer: Self-pay | Admitting: Internal Medicine

## 2020-11-24 ENCOUNTER — Other Ambulatory Visit: Payer: Self-pay | Admitting: Family Medicine

## 2020-11-24 DIAGNOSIS — E041 Nontoxic single thyroid nodule: Secondary | ICD-10-CM

## 2020-12-09 ENCOUNTER — Other Ambulatory Visit: Payer: Self-pay | Admitting: Internal Medicine

## 2020-12-28 ENCOUNTER — Ambulatory Visit: Payer: BC Managed Care – PPO | Admitting: Internal Medicine

## 2020-12-28 ENCOUNTER — Other Ambulatory Visit: Payer: Self-pay

## 2020-12-28 ENCOUNTER — Encounter: Payer: Self-pay | Admitting: Internal Medicine

## 2020-12-28 VITALS — BP 120/78 | HR 93 | Ht 63.0 in | Wt 199.8 lb

## 2020-12-28 DIAGNOSIS — E042 Nontoxic multinodular goiter: Secondary | ICD-10-CM

## 2020-12-28 DIAGNOSIS — E1142 Type 2 diabetes mellitus with diabetic polyneuropathy: Secondary | ICD-10-CM

## 2020-12-28 DIAGNOSIS — E785 Hyperlipidemia, unspecified: Secondary | ICD-10-CM

## 2020-12-28 LAB — POCT GLYCOSYLATED HEMOGLOBIN (HGB A1C): Hemoglobin A1C: 7 % — AB (ref 4.0–5.6)

## 2020-12-28 NOTE — Addendum Note (Signed)
Addended by: Lauralyn Primes on: 12/28/2020 11:09 AM   Modules accepted: Orders

## 2020-12-28 NOTE — Patient Instructions (Signed)
Please continue: - Metformin 1000 mg 2x a day with meals. - Jardiance 25 mg daily before b'fast - Trulicity 3 mcg weekly - Levemir 40 units in a.m. and 50 units at bedtime - Humalog 17-20 (25) units before meals  Please return in 4 months with your sugar log.

## 2020-12-28 NOTE — Progress Notes (Signed)
Patient ID: Madison Cowan, female   DOB: December 09, 1960, 60 y.o.   MRN: [id number]   This visit occurred during the SARS-CoV-2 public health emergency.  Safety protocols were in place, including screening questions prior to the visit, additional usage of staff PPE, and extensive cleaning of exam room while observing appropriate contact time as indicated for disinfecting solutions.   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 Last visit 4 months ago (virtual).  DM2: Reviewed HbA1c levels: Lab Results  Component Value Date   HGBA1C 6.8 (A) 04/20/2020   HGBA1C 7.3 (A) 12/15/2019   HGBA1C 6.4 (A) 08/09/2019  01/14/2020: HbA1c 6.7%  She is on: - Metformin 1000 mg 2x a day with meals. - Jardiance 25 mg daily before b'fast - Trulicity 1.5 >> 3 mg weekly - Levemir 50 units in a.m. And 70 units at bedtime >> 40 units in a.m. and 50 units at bedtime - Novolog >> Humalog 20-25 >> 17-20 units before meals  Pt checks her sugars 4 times a day -improved in the last week: - am:  134, 145-189 >> 91-130s >> 103-243 (last week: 97-134) - 2h after b'fast: 76 >> n/c >> 131-144, 220 >> n/c - before lunch: 115, 158 >> see above (early lunch) >> 117-147 - 2h after lunch: 175, 184, 210 >> 65, 70-120, 144 >> 162 - before dinner: 82, 110-130, 165 >> 92-111, 163 >> 75,  100-133, 170 - 2h after dinner: 71, 225 >> 183 >> n/c >> 143-187, 202 - bedtime: 150, 161 >> n/c >> 125, 186 >> 70, 95 >> n/c - nighttime: n/c >> 133 >> n/c >> 107 Lowest sugar was 71 >> 82 >> 65 (2h after lunch) >> 75; she has hypoglycemia awareness in the 90s. Highest sugar was  280s >> 189 >> 220 (am- forgot meds the night before) >> 243 (forgot levemir and metformin).  Glucometer: One Touch ultra  Pt's meals are: - Breakfast: protein shakes (6g carbs); McDonads >> usually skip  -  Lunch: may skip; salads cheeseburger; school cafeteria food >> may skip - Dinner: fast food; porc chops + collard greens + salad; hamburger; McNuggets - Snacks: sometimes; sweets, nuts, chips She does not have organized mealtimes.  -Mild CKD, last BUN/creatinine:  01/14/2020: CMP normal with the exception of a high glucose at 116.  BUN/creatinine 19/0.9, GFR 69 Lab Results  Component Value Date   BUN 25 12/16/2018   BUN 20 10/15/2017   CREATININE 1.09 (H) 12/16/2018   CREATININE 0.92 10/15/2017  On Losartan.  -+ HL. Last set of lipids: 01/14/2020: 126/183/34/55 Lab Results  Component Value Date   CHOL 98 12/16/2018   HDL 29.30 (L) 12/16/2018   LDLCALC 44 12/16/2018   TRIG 120.0 12/16/2018   CHOLHDL 3 12/16/2018  On Crestor 10. - last eye exam was in 02/2020: No DR.  She is a glaucoma suspect. - + Numbness, tingling, and also burning in her feet.  She continues on Lyrica, B complex, alpha lipoic acid.  She also has HTN.  Thyroid nodules:  Reviewed her imaging reports and biopsies: Thyroid ultrasound (01/03/2016): Dominant nodule is a right mid lobe hypoechoic nodule of 2.1 x 0.8 x 1.2 cm, and previously measured up to 2.3 cm. There is one left thyroid nodule measuring 6 mm and several other tiny scattered nodules.  She had a benign biopsy of the  dominant nodule.  Thyroid ultrasound (04/29/2017): Previously biopsied right nodule is unchanged.  She has 2 additional subcentimeter thyroid nodules, that did not require follow-up.  Also, a hypoechoic lymph node on the right side of the neck, 1.9 cm x 0.5 cm.  Neck CT (11/19/2017): Thyroid: Small thyroid nodules as seen at ultrasound. No worrisome finding by CT. Largest nodule on the right measures 1.7 cm. Lymph nodes: No enlarged or low-density nodes on either side of the neck. Symmetric normal appearing lymph nodes by size criteria.  She had another pain.  After this, she was advised that one of the thyroid nodules need to be biopsied.   I was able to review the report in care everywhere: ISTHMUS:  - Size: 0.3 cm.   RIGHT LOBE:  - Size: 4.6 x 1.9 x 1.6 cm.  - Echogenicity: Heterogeneous.   LEFT LOBE:  - Size: 2.7 x 1.7 x 1.6 cm.  - Echogenicity: Heterogeneous.   NODULES:   Nodule 1:  -- Size: 1.8 x 1 x 1.3 cm (long x AP x trans).  -- Location: Right lower.  -- Composition: solid or almost completely solid (2)  -- Echogenicity: very hypoechoic (3)  -- Shape: wider-than-tall (0)  -- Margins: smooth (0)  -- Echogenic foci: punctate echogenic foci (3)   -- ACR TI-RADS total points and risk category: 8 Points - TR5.   Nodule 2:  -- Size: 0.9 x 0.7 x1 cm (long x AP x trans).  -- Location: Right lower.   -- Composition: solid or almost completely solid (2)  -- Echogenicity: hypoechoic (2)  -- Shape: wider-than-tall (0)  -- Margins: smooth (0)  -- Echogenic foci: none (0)  -- ACR TI-RADS total points and risk category: 4 Points - TR4.   Nodule 3:  -- Size: 0.7 x 0.5 x 0.7 cm (long x AP x trans).  -- Location: Right lower.  -- Composition: cystic or almost completely cystic (0)  -- Echogenicity: anechoic (0)  -- Shape: wider-than-tall (0)  -- Margins: smooth (0)  -- Echogenic foci: peripheral calcifications (2)   -- ACR TI-RADS total points and risk category: 2 Points - TR2.   Nodule 4:  -- Size: 0.9 x 0.7 x 0.7 cm (long x AP x trans).  -- Location: Left lower.  -- Composition: solid or almost completely solid (2)  -- Echogenicity: hypoechoic (2)  -- Shape: wider-than-tall (0)  -- Margins: smooth (0)  -- Echogenic foci: peripheral calcifications (2)  -- ACR TI-RADS total points and risk category: 6 Points - TR4.   MISCELLANEOUS:  There are other scattered less than 5 mm nodules or cysts throughout both lobes.   Pt denies: - feeling nodules in neck - hoarseness - dysphagia - choking - SOB with lying down  Review latest TFTs: 01/14/2020: TSH 1.12 Lab Results  Component Value Date   TSH  1.69 12/16/2018   Before the coronavirus pandemic, she was meeting with friends one afternoon a month to play board games.  On Amlodipine now.  ROS: Constitutional: no weight gain/no weight loss, no fatigue, no subjective hyperthermia, no subjective hypothermia Eyes: no blurry vision, no xerophthalmia ENT: no sore throat, + see HPI Cardiovascular: no CP/no SOB/no palpitations/no leg swelling Respiratory: no cough/no SOB/no wheezing Gastrointestinal: no N/no V/no D/no C/no acid reflux Musculoskeletal: no muscle aches/no joint aches Skin: no rashes, no hair loss Neurological: no tremors/no numbness/no tingling/no dizziness  I reviewed pt's medications, allergies, PMH, social hx, family hx, and changes were documented in the  history of present illness. Otherwise, unchanged from my initial visit note.   Past Medical History:  Diagnosis Date  . Allergy    Zyrtec daily.  . Arthritis    DDD lumbar spine  . Asthma    Albuterol seasonally.  . Diabetes mellitus without complication ([place])   . Glaucoma   . Heart murmur   . Hyperlipidemia   . Hypertension   . Neuromuscular disorder ([place])    R third nerve palsy  . OSA (obstructive sleep apnea) 11/11/2006   CPAP  . Thyroid disease    Thyroid nodules s/p needle biopsy negative.    Also, spondylolisthesis L4-L5  Past Surgical History:  Procedure Laterality Date  . CHOLECYSTECTOMY OPEN  1979   Social History   Social History  . Marital status: Divorced    Spouse name: N/A  . 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 Smoking status: Former Smoker    Packs/day: 0.05    Years: 18.00    Types: Cigarettes    Quit date: 02/2016  . Smokeless tobacco: Never Used  . Alcohol use 0.0 oz/week     Comment: maybe 1-2 drinks per yr  . Drug use: No  . Sexual activity: No   Other Topics Concern  . 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  [employer] work.      Tobacco: 1/2 pdd x 20 years.      Alcohol:  None      Drugs: none       Education: [employer].       Exercise: No.      Seatbelt: 100%      Guns: unloaded.      Sexually active: none; total partners = 5.  Last STD screen 2006.     Current Outpatient Medications on File Prior to Visit  Medication Sig Dispense Refill  . Ascorbic Acid (VITAMIN C) 100 MG tablet Take 100 mg by mouth daily.    Marland Kitchen aspirin 325 MG EC tablet Take 325 mg by mouth daily.    Marland Kitchen b complex vitamins tablet Take 1 tablet by mouth daily. (Patient not taking: Reported on 08/23/2020)    . insulin detemir (LEVEMIR FLEXTOUCH) 100 UNIT/ML FlexPen INJECT 40 UNITS UNDER THE SKIN EVERY MORNING AND 50 UNITS EVERY EVENING 15 mL 5  . Insulin Pen Needle (BD PEN NEEDLE NANO U/F) 32G X 4 MM MISC USE 5 TIMES DAILY FOR INSULIN 200 each 3  . JARDIANCE 25 MG TABS tablet TAKE ONE TABLET BY MOUTH DAILY 90 tablet 3  . levocetirizine (XYZAL) 5 MG tablet Take 5 mg by mouth every evening.    Marland Kitchen losartan-hydrochlorothiazide (HYZAAR) 100-12.5 MG tablet Take 1 tablet by mouth daily. Pt need new provider for refills 30 tablet 0  . metFORMIN (GLUCOPHAGE) 1000 MG tablet Take 1 tablet (1,000 mg total) by mouth 2 (two) times daily. 180 tablet 3  . Multiple Vitamins-Minerals (MULTIVITAMIN WITH MINERALS) tablet Take 1 tablet by mouth daily.    . Multiple Vitamins-Minerals (ZINC PO) Take 15 mg by mouth daily.    Marland Kitchen NOVOLOG FLEXPEN 100 UNIT/ML FlexPen INJECT 15-30 UNITS INTO THE  SKIN THREE TIMES A DAY WITH MEALS 81 mL 1  . ONETOUCH ULTRA test strip USE TO TEST BLOOD SUGAR THREE TO FOUR TIMES DAILY 300 strip 2  . pregabalin (LYRICA) 25 MG capsule Take 1-2 capsules (25-50 mg total) by mouth at bedtime. 60 capsule 5  . rosuvastatin (CRESTOR) 10 MG tablet TAKE ONE TABLET BY  MOUTH DAILY 30 tablet 0  . TRULICITY 3 BW/6.2MB SOPN INJECT 3 MG UNDER THE SKIN ONCE WEEKLY 2 mL 1  . Vitamin D, Ergocalciferol, (DRISDOL) 1.25 MG (50000 UNIT) CAPS capsule Take 50,000 Units by mouth every 7 (seven) days.     No current facility-administered medications on file prior to visit.   No Known Allergies Family History  Problem Relation Age of Onset  . Diabetes Mother   . Hypertension Mother   . Ovarian cancer Mother 34  . Breast cancer Mother 84  . Cancer Mother 39       ovarian cancer age 63; breast cancer age 47  . Diabetes Father   . Heart disease Father 62       CAD/CABG; AAA; CHF  . Hyperlipidemia Father   . Hypertension Father   . Prostate cancer Father        dx. 79-83  . Colon polyps Father        section of colon removed; unsure of #  . Heart disease Brother        Died of CHF at age 50.  Marland Kitchen Hyperlipidemia Brother   . Hypertension Brother   . Congestive Heart Failure Brother   . Heart disease Sister 72       3-vessel CAD s/p 1 stenting; CABG  . Diabetes Sister   . Hyperlipidemia Sister   . Hypertension Sister   . Cancer Brother 52       GI- esophageal; smoker  . Heart disease Brother        MIs in 66s and CABG.  . Diabetes Brother   . Hypertension Brother   . Stroke Maternal Grandmother   . Hypertension Maternal Grandmother   . Skin cancer Maternal Grandfather        multiple - unknown types; dx. 10s  . Cancer Maternal Grandfather   . Heart Problems Paternal Grandfather   . Skin cancer Maternal Aunt        multiple - unknown type; dx. 27s  . Pancreatic cancer Maternal Uncle        dx. 23s   PE: BP 120/78   Pulse 93   Ht 5\' 3"  (1.6 m)   Wt 199 lb 12.8 oz (90.6 kg)   SpO2 98%   BMI 35.39 kg/m  Wt Readings from Last 3 Encounters:  12/28/20 199 lb 12.8 oz (90.6 kg)  08/23/20 198 lb (89.8 kg)  05/04/20 200 lb 11.2 oz (91 kg)   Constitutional: overweight, in NAD Eyes: PERRLA, EOMI, no exophthalmos ENT: moist mucous membranes, no  thyromegaly, no cervical lymphadenopathy Cardiovascular: RRR, No MRG Respiratory: CTA B Gastrointestinal: abdomen soft, NT, ND, BS+ Musculoskeletal: no deformities, strength intact in all 4 Skin: moist, warm, no rashes Neurological: no tremor with outstretched hands, DTR normal in all 4  ASSESSMENT: 1. DM2, insulin-dependent, now controlled, with complications - Peripheral neuropathy.  2. MNG  3. HL  PLAN:  1. Patient with longstanding, fairly well controlled type 2 diabetes on a complex medication regimen including basal-bolus insulin regimen, weekly GLP-1 receptor agonist, and also Metformin and SGLT2 inhibitor.  At last visit, sugars improved, with only  few exceptions in the hyperglycemic range due to dietary indiscretions or forgetting her medicines.  However, the rest of the labs were at goal and even some of them were lower after meals so I advised her to decrease the Humalog doses as she already started to improve her diet.  We discussed about possibly decreasing the Levemir dose in the near future.  An HbA1c at that time was 6.8%, improved. -At today's visit, we reviewed together her blood sugars at home.  They were higher over the holidays and at the beginning of January.  Since then, she joint an online diabetes group and she also started to improve her diet.  Her blood sugars improved especially in the last week and they are now almost all at goal.  Therefore, I advised her to continue with the diet changes but would not change her regimen. - I suggested to:  Patient Instructions  Please continue: - Metformin 1000 mg 2x a day with meals. - Jardiance 25 mg daily before b'fast - Trulicity 3 mcg weekly - Levemir 40 units in a.m. and 50 units at bedtime - Humalog 17-20 (25) units before meals  Please return in 4 months with your sugar log.   - we checked her HbA1c: 7.0% (slightly higher) - advised to check sugars at different times of the day - 4x a day, rotating check times -  advised for yearly eye exams >> she is UTD - return to clinic in 4 months  2. MNG -Reviewed latest ultrasound report from 2019: Stable right dominant nodule, which was previously biopsied with benign results.  She had a neck CT afterwards which did not show any worrisome nodules in her neck. -She saw her PCP in 10/2020, and apparently at that time the nodules were felt to be enlarged.  A new thyroid ultrasound was checked (I do not have the images, only the report) and this showed mostly subcentimeter nodules while the dominant right nodule was 1.8 cm in the largest dimension.  Of note, this nodule was biopsied with benign results in the past and, per review of records, this is actually decreased in size on the last ultrasound, from 2.1 cm.  Therefore, for now, I would not suggest a new biopsy. -She denies neck compression symptoms -Latest TSH was normal in 01/2020  3. HL -Reviewed latest lipid panel from 01/14/2020: LDL at goal, triglycerides slightly high, HDL slightly low:126/183/34/55 -Continues Crestor 10 without side effects  Philemon Kingdom, MD PhD [facility]

## 2021-01-08 ENCOUNTER — Ambulatory Visit
Admission: RE | Admit: 2021-01-08 | Discharge: 2021-01-08 | Disposition: A | Discharge status: Home / Self Care | Payer: Self-pay | Source: Ambulatory Visit | Attending: Family Medicine | Admitting: Family Medicine

## 2021-01-08 ENCOUNTER — Other Ambulatory Visit: Payer: Self-pay | Admitting: Family Medicine

## 2021-01-08 DIAGNOSIS — E041 Nontoxic single thyroid nodule: Secondary | ICD-10-CM

## 2021-02-15 ENCOUNTER — Other Ambulatory Visit: Payer: Self-pay | Admitting: Internal Medicine

## 2021-02-19 ENCOUNTER — Other Ambulatory Visit: Payer: Self-pay | Admitting: Internal Medicine

## 2021-02-25 ENCOUNTER — Other Ambulatory Visit: Payer: Self-pay | Admitting: Internal Medicine

## 2021-04-15 ENCOUNTER — Other Ambulatory Visit: Payer: Self-pay | Admitting: Internal Medicine

## 2021-05-04 ENCOUNTER — Other Ambulatory Visit: Payer: Self-pay

## 2021-05-04 ENCOUNTER — Encounter: Payer: Self-pay | Admitting: Internal Medicine

## 2021-05-04 ENCOUNTER — Ambulatory Visit: Payer: BC Managed Care – PPO | Admitting: Internal Medicine

## 2021-05-04 VITALS — BP 128/82 | HR 90 | Ht 63.0 in | Wt 199.6 lb

## 2021-05-04 DIAGNOSIS — E785 Hyperlipidemia, unspecified: Secondary | ICD-10-CM | POA: Diagnosis not present

## 2021-05-04 DIAGNOSIS — E042 Nontoxic multinodular goiter: Secondary | ICD-10-CM | POA: Diagnosis not present

## 2021-05-04 DIAGNOSIS — E1142 Type 2 diabetes mellitus with diabetic polyneuropathy: Secondary | ICD-10-CM

## 2021-05-04 MED ORDER — FREESTYLE LIBRE 2 READER DEVI: 1.0000 | Freq: Every day | 0 refills | Status: DC

## 2021-05-04 MED ORDER — FREESTYLE LIBRE 2 SENSOR MISC: 1.0000 | 3 refills | Status: DC

## 2021-05-04 NOTE — Progress Notes (Signed)
Patient ID: Madison Cowan, female   DOB: [birth year], 60 y.o.   MRN: [id number]   This visit occurred during the SARS-CoV-2 public health emergency.  Safety protocols were in place, including screening questions prior to the visit, additional usage of staff PPE, and extensive cleaning of exam room while observing appropriate contact time as indicated for disinfecting solutions.   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 Last visit 4 months ago (virtual).  Interim hx: No increased urination, blurry vision, nausea, chest pain. She is retiring 05/11/2021. She has a high stress job.  She feels that she will be able to take care more for diabetes after he retires.  DM2: Reviewed HbA1c levels: 04/25/2021: HbA1c 7.2% Lab Results  Component Value Date   HGBA1C 7.0 (A) 12/28/2020   HGBA1C 6.8 (A) 04/20/2020   HGBA1C 7.3 (A) 12/15/2019  01/14/2020: HbA1c 6.7%  She is on: - Metformin 1000 mg 2x a day with meals. - Jardiance 25 mg daily before b'fast - Trulicity 1.5 >> 3 mg weekly - Levemir 50 units in a.m. And 70 units at bedtime >> 40 units in a.m. and 50 units at bedtime (may forget) - Novolog >> Humalog 20-25 >> 17-20 >> 20-24 units before meals  Pt checks her sugars 4 times a day: - am:  134, 145-189 >> 91-130s >> 103-243 (last week: 97-134) >> 110-190, 197 - 2h after b'fast: 76 >> n/c >> 131-144, 220 >> n/c >> 148 - before lunch: 115, 158 >> see above (early lunch) >> 117-147 >> 107 - 2h after lunch: 175, 184, 210 >> 65, 70-120, 144 >> 162 >> 111-154 - before dinner:  92-111, 163 >> 75,  100-133, 170 >> 84-169, 190 - 2h after dinner: 71, 225 >> 183 >> n/c >> 143-187, 202 >> 99-148, 213 - bedtime: 150, 161 >> n/c >> 125, 186 >> 70, 95 >> n/c >> n/c - nighttime: n/c >> 133 >> n/c >> 107 Lowest sugar was 65 (2h after lunch) >> 75>>  84; she has hypoglycemia awareness in the 90s. Highest sugar was 220 (am- forgot meds the night before) >> 243 (forgot levemir and metformin) >> 213.  Glucometer: One Touch ultra  Pt's meals are: - Breakfast: protein shakes (6g carbs); McDonads >> usually skips - Lunch: may skip; salads cheeseburger; school cafeteria food >> may skip - Dinner: fast food; porc chops + collard greens + salad; hamburger; McNuggets - Snacks: sometimes; sweets, nuts, chips She does not have organized mealtimes.  -Mild CKD, last BUN/creatinine:  04/25/2021: 18/1.12, GFR 53, Glu 144 01/14/2020: CMP normal with the exception of a high glucose at 116.  BUN/creatinine 19/0.9, GFR 69 Lab Results  Component Value Date   BUN 25 12/16/2018   BUN 20 10/15/2017   CREATININE 1.09 (H) 12/16/2018   CREATININE 0.92 10/15/2017  On Losartan.  -+ HL. Last set of lipids: 04/25/2021: 137/136/43/67 01/14/2020: 126/183/34/55 Lab Results  Component Value Date   CHOL 98 12/16/2018   HDL 29.30 (L) 12/16/2018   LDLCALC 44 12/16/2018   TRIG 120.0 12/16/2018   CHOLHDL 3 12/16/2018  On Crestor 10. - last eye exam was in 02/2020: No DR.  She is a glaucoma suspect.  - + Numbness, tingling, and also burning in her feet.  She continues on Lyrica, B complex, alpha lipoic acid.  She also has HTN.  Thyroid nodules:  Reviewed her imaging reports and biopsies: Thyroid ultrasound (01/03/2016): Dominant nodule is a right mid lobe hypoechoic nodule of 2.1 x 0.8 x 1.2 cm, and previously measured up to 2.3 cm. There is one left thyroid nodule measuring 6 mm and several other tiny scattered nodules.  She had a benign biopsy of the dominant nodule.  Thyroid ultrasound (04/29/2017): Previously biopsied right nodule is unchanged.  She has 2 additional subcentimeter thyroid nodules, that did not require follow-up.  Also, a hypoechoic lymph node on the right side of the neck, 1.9 cm x 0.5 cm.  Neck CT (11/19/2017): Thyroid: Small thyroid  nodules as seen at ultrasound. No worrisome finding by CT. Largest nodule on the right measures 1.7 cm. Lymph nodes: No enlarged or low-density nodes on either side of the neck. Symmetric normal appearing lymph nodes by size criteria.  She had another thyroid U/S 10/31/2020.  After this, she was advised that one of the thyroid nodules need to be biopsied.  I was able to review the report in care everywhere: ISTHMUS:  - Size: 0.3 cm.   RIGHT LOBE:  - Size: 4.6 x 1.9 x 1.6 cm.  - Echogenicity: Heterogeneous.   LEFT LOBE:  - Size: 2.7 x 1.7 x 1.6 cm.  - Echogenicity: Heterogeneous.   NODULES:   Nodule 1:  -- Size: 1.8 x 1 x 1.3 cm (long x AP x trans).  -- Location: Right lower.  -- Composition: solid or almost completely solid (2)  -- Echogenicity: very hypoechoic (3)  -- Shape: wider-than-tall (0)  -- Margins: smooth (0)  -- Echogenic foci: punctate echogenic foci (3)  -- ACR TI-RADS total points and risk category: 8 Points - TR5.   Nodule 2:  -- Size: 0.9 x 0.7 x 1 cm (long x AP x trans).  -- Location: Right lower.  -- Composition: solid or almost completely solid (2)  -- Echogenicity: hypoechoic (2)  -- Shape: wider-than-tall (0)  -- Margins: smooth (0)  -- Echogenic foci: none (0)  -- ACR TI-RADS total points and risk category: 4 Points - TR4.   Nodule 3:  -- Size: 0.7 x 0.5 x 0.7 cm (long x AP x trans).  -- Location: Right lower.  -- Composition: cystic or almost completely cystic (0)  -- Echogenicity: anechoic (0)  -- Shape: wider-than-tall (0)  -- Margins: smooth (0)  -- Echogenic foci: peripheral calcifications (2)  -- ACR TI-RADS total points and risk category: 2 Points - TR2.   Nodule 4:  -- Size: 0.9 x 0.7 x 0.7 cm (long x AP x trans).  -- Location: Left lower.  -- Composition: solid or almost completely solid (2)  -- Echogenicity: hypoechoic (2)  -- Shape: wider-than-tall (0)  -- Margins: smooth (0)  -- Echogenic foci: peripheral calcifications (2)  --  ACR TI-RADS total points and risk category: 6 Points - TR4.   MISCELLANEOUS:  There are other scattered less than 5 mm nodules or cysts throughout both lobes.  Pt denies: - feeling nodules in neck - hoarseness - dysphagia - choking - SOB with lying down  Review latest TFTs: 04/25/2021: TSH 1.66 01/14/2020: TSH 1.12 Lab Results  Component Value Date   TSH 1.69 12/16/2018   Before the coronavirus pandemic, she was meeting with friends one afternoon a month to play board games.  On Amlodipine now.  ROS: Constitutional: no weight gain/no weight loss, no fatigue, no subjective hyperthermia, no subjective hypothermia Eyes: no blurry vision, no xerophthalmia ENT: no sore throat, +  see HPI Cardiovascular: no CP/no SOB/no palpitations/no leg swelling Respiratory: no cough/no SOB/no wheezing Gastrointestinal: no N/no V/no D/no C/no acid reflux Musculoskeletal: no muscle aches/no joint aches Skin: no rashes, no hair loss Neurological: no tremors/no numbness/no tingling/no dizziness  I reviewed pt's medications, allergies, PMH, social hx, family hx, and changes were documented in the history of present illness. Otherwise, unchanged from my initial visit note.  Past Medical History:  Diagnosis Date   Allergy    Zyrtec daily.   Arthritis    DDD lumbar spine   Asthma    Albuterol seasonally.   Diabetes mellitus without complication (HCC)    Glaucoma    Heart murmur    Hyperlipidemia    Hypertension    Neuromuscular disorder ([place])    R third nerve palsy   OSA (obstructive sleep apnea) 11/11/2006   CPAP   Thyroid disease    Thyroid nodules s/p needle biopsy negative.    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  [place] work.      Tobacco: 1/2 pdd x 20 years.      Alcohol:  None      Drugs: none       Education: [employer].       Exercise: No.      Seatbelt: 100%      Guns: unloaded.      Sexually active: none; total partners = 5.  Last STD screen 2006.     Current Outpatient Medications on File Prior to Visit  Medication Sig Dispense Refill   amLODipine (NORVASC) 5 MG tablet Take 5 mg by mouth daily.     Ascorbic Acid (VITAMIN C) 100 MG tablet Take 100 mg by mouth daily.     aspirin 325 MG EC tablet Take 325 mg by mouth daily.     b complex vitamins tablet Take 1 tablet by mouth daily.     BD PEN NEEDLE NANO 2ND GEN 32G X 4 MM MISC USE FIVE TIMES A DAY FOR INSULIN 200 each 3   insulin detemir (LEVEMIR FLEXTOUCH) 100 UNIT/ML FlexPen INJECT 40 UNITS UNDER THE SKIN EVERY MORNING AND INJECT 50 UNITS UNDER THE SKIN EVERY EVENING 30 mL 3   JARDIANCE 25 MG TABS tablet TAKE ONE TABLET BY MOUTH DAILY 90 tablet 3   levocetirizine (XYZAL) 5 MG tablet Take 5 mg by mouth every evening.     losartan-hydrochlorothiazide (HYZAAR) 100-12.5 MG tablet Take 1 tablet by mouth daily. Pt need new provider for  refills 30 tablet 0   metFORMIN (GLUCOPHAGE) 1000 MG tablet Take 1 tablet (1,000 mg total) by mouth 2 (two) times daily. 180 tablet 3   Multiple Vitamins-Minerals (MULTIVITAMIN WITH MINERALS) tablet Take 1 tablet by mouth daily.     Multiple Vitamins-Minerals (ZINC PO) Take 15 mg by mouth daily.      NOVOLOG FLEXPEN 100 UNIT/ML FlexPen INJECT 15 TO 30 UNITS UNDER THE SKIN THREE TIMES A DAY WITH A MEAL 81 mL 1   ONETOUCH ULTRA test strip USE TO TEST BLOOD SUGAR THREE TO FOUR TIMES DAILY 300 strip 2   pregabalin (LYRICA) 25 MG capsule Take 1-2 capsules (25-50 mg total) by mouth at bedtime. 60 capsule 5   rosuvastatin (CRESTOR) 10 MG tablet TAKE ONE TABLET BY MOUTH DAILY 30 tablet 0   TRULICITY 3 TG/6.2IR SOPN INJECT 0.5ML (3 MG) UNDER THE SKIN ONCE WEEKLY 2 mL 1   Vitamin D, Ergocalciferol, (DRISDOL) 1.25 MG (50000 UNIT) CAPS capsule Take 50,000 Units by mouth every 7 (seven) days.     No current facility-administered medications on file prior to visit.   No Known Allergies Family History  Problem Relation Age of Onset   Diabetes Mother    Hypertension Mother    Ovarian cancer Mother 41   Breast cancer Mother 97   Cancer Mother 33       ovarian cancer age 56; breast cancer age 88   Diabetes Father    Heart disease Father 50       CAD/CABG; AAA; CHF   Hyperlipidemia Father    Hypertension Father    Prostate cancer Father        dx. 82-83   Colon polyps Father        section of colon removed; unsure of #   Heart disease Brother        Died of CHF at age 10.   Hyperlipidemia Brother    Hypertension Brother    Congestive Heart Failure Brother    Heart disease Sister 10       3-vessel CAD s/p 1 stenting; CABG   Diabetes Sister    Hyperlipidemia Sister    Hypertension Sister    Cancer Brother 63       GI- esophageal; smoker   Heart disease Brother        MIs in 72s and CABG.   Diabetes Brother    Hypertension Brother    Stroke Maternal Grandmother    Hypertension Maternal Grandmother    Skin cancer Maternal Grandfather        multiple - unknown types; dx. 86s   Cancer Maternal Grandfather    Heart Problems Paternal Grandfather    Skin cancer Maternal Aunt        multiple - unknown type; dx. 40s   Pancreatic cancer Maternal Uncle        dx. 28s   PE: BP 128/82    Pulse 90   Ht 5\' 3"  (1.6 m)   Wt 199 lb 9.6 oz (90.5 kg)   SpO2 99%   BMI 35.36 kg/m  Wt Readings from Last 3 Encounters:  05/04/21 199 lb 9.6 oz (90.5 kg)  12/28/20 199 lb 12.8 oz (90.6 kg)  08/23/20 198 lb (89.8 kg)   Constitutional: overweight, in NAD Eyes: PERRLA, EOMI, no exophthalmos ENT: moist mucous membranes, no thyromegaly, no cervical lymphadenopathy Cardiovascular: RRR, No MRG Respiratory: CTA B Gastrointestinal: abdomen soft, NT, ND, BS+ Musculoskeletal: no deformities, strength intact in all 4 Skin: moist,  warm, no rashes Neurological: no tremor with outstretched hands, DTR normal in all 4  ASSESSMENT: 1. DM2, insulin-dependent, now controlled, with complications - Peripheral neuropathy.  2. MNG  3. HL  PLAN:  1. Patient with longstanding, fairly well-controlled type 2 diabetes, on a complex medication regimen including basal-bolus insulin regimen, GLP-1 receptor agonist, SGLT2 inhibitor, and metformin.  At last visit, we did not change her regimen, also sugars are slightly higher over the holidays.  She started to improve her diet after she joined an online diabetic group.  HbA1c is was slightly higher, at 7.0%.  Since then, she had another HbA1c more recently, at 7.2%, higher. -Sugars at home are above target mostly before meals and they are usually at goal after meals.  Upon questioning, she admits to missing her evening Levemir dose and also occasionally metformin in the evening.  We discussed about moving these earlier so she does not fall asleep without taking them.  I suggested alarms on her phone for this.  However, otherwise, I do not feel we absolutely need to change her regimen, especially as she is planning to retire and take more care of herself. -She is interested in a CGM, for which she would definitely qualify due to the many insulin doses that she uses during the day.  I sent a prescription to her pharmacy. - I suggested to:  Patient Instructions   Please continue: - Metformin 1000 mg 2x a day with meals. - Jardiance 25 mg daily before b'fast - Trulicity 3 mcg weekly - Levemir 40 units in a.m. and 50 units at bedtime - Humalog 20-25 units before meals  Please return in 4 months with your sugar log.   - advised to check sugars at different times of the day - 4x a day, rotating check times - advised for yearly eye exams >> she is not UTD - return to clinic in 4 months  2. MNG -Reviewed her thyroid ultrasound report from 2019: Stable right dominant nodule, which was previously biopsied with benign results.  She had a neck CT afterwards which did not show any worrisome nodules in her neck. -She saw her PCP in 10/2020, and at that time the nodules were felt to be enlarged.  Another thyroid ultrasound was checked (I do not have the images, only the report) and this showed mostly subcentimeter nodules while the dominant right nodule was 1.8 cm in the largest dimension.  Of note, this nodule was biopsied with benign results in the past and, per review of records, this is actually decreased in size on the last ultrasound, from 2.1 cm.  Therefore, I did not suggest a new biopsy at that time. -We will plan to repeat another ultrasound in 10/2021 and decide at that time whether new biopsy is needed -She denies neck compression symptoms -Latest TSH was normal in 04/2021, earlier this month  3. HL -Reviewed latest lipid panel from 04/2021: All fractions at goal, including VLDL -Continues Crestor 10 mg daily without side effects  Philemon Kingdom, MD PhD [facility] Endocrinology

## 2021-05-04 NOTE — Patient Instructions (Addendum)
Please continue: - Metformin 1000 mg 2x a day with meals. - Jardiance 25 mg daily before b'fast - Trulicity 3 mcg weekly - Levemir 40 units in a.m. and 50 units at bedtime - Humalog 20-25 units before meals  Please return in 4 months with your sugar log.

## 2021-05-07 ENCOUNTER — Ambulatory Visit: Payer: BC Managed Care – PPO | Admitting: Podiatry

## 2021-06-04 ENCOUNTER — Ambulatory Visit: Payer: BC Managed Care – PPO | Admitting: Podiatry

## 2021-06-04 ENCOUNTER — Encounter: Payer: Self-pay | Admitting: Podiatry

## 2021-06-04 ENCOUNTER — Other Ambulatory Visit: Payer: Self-pay

## 2021-06-04 DIAGNOSIS — B351 Tinea unguium: Secondary | ICD-10-CM

## 2021-06-04 DIAGNOSIS — G63 Polyneuropathy in diseases classified elsewhere: Secondary | ICD-10-CM

## 2021-06-04 DIAGNOSIS — M79675 Pain in left toe(s): Secondary | ICD-10-CM | POA: Diagnosis not present

## 2021-06-04 DIAGNOSIS — E1142 Type 2 diabetes mellitus with diabetic polyneuropathy: Secondary | ICD-10-CM | POA: Diagnosis not present

## 2021-06-04 DIAGNOSIS — M79674 Pain in right toe(s): Secondary | ICD-10-CM

## 2021-06-04 NOTE — Progress Notes (Signed)
This patient presents to the office with chief complaint of long thick  big nails and diabetic feet.  This patient  says there  is  no pain and discomfort in their feet.  This patient says there are long thick painful  big nails.  These nails are painful walking and wearing shoes.  Patient has no history of infection or drainage from both feet.  Patient is unable to  self treat his own nails . This patient presents  to the office today for treatment of the  long nails and a foot evaluation due to history of  diabetes.  General Appearance  Alert, conversant and in no acute stress.  Vascular  Dorsalis pedis and posterior tibial  pulses are palpable  bilaterally.  Capillary return is within normal limits  bilaterally. Temperature is within normal limits  bilaterally.  Neurologic  Senn-Weinstein monofilament wire test within normal limits  bilaterally. Muscle power within normal limits bilaterally.  Nails Thick disfigured discolored nails with subungual debris  from hallux to fifth toes bilaterally. No evidence of bacterial infection or drainage bilaterally.  Orthopedic  No limitations of motion of motion feet .  No crepitus or effusions noted.  No bony pathology or digital deformities noted.  Skin  normotropic skin with no porokeratosis noted bilaterally.  No signs of infections or ulcers noted.     Onychomycosis  Diabetes with no foot complications  IE  Debride nails x 10.  A diabetic foot exam was performed and there is no evidence of any vascular or neurologic pathology.   RTC 3 months.   Gardiner Barefoot DPM

## 2021-06-05 ENCOUNTER — Encounter: Payer: Self-pay | Admitting: Gastroenterology

## 2021-06-06 ENCOUNTER — Other Ambulatory Visit: Payer: Self-pay | Admitting: Internal Medicine

## 2021-07-03 ENCOUNTER — Other Ambulatory Visit: Payer: Self-pay | Admitting: Internal Medicine

## 2021-07-24 ENCOUNTER — Ambulatory Visit (AMBULATORY_SURGERY_CENTER): Payer: BC Managed Care – PPO | Admitting: *Deleted

## 2021-07-24 ENCOUNTER — Encounter: Payer: Self-pay | Admitting: Gastroenterology

## 2021-07-24 ENCOUNTER — Other Ambulatory Visit: Payer: Self-pay

## 2021-07-24 VITALS — Ht 63.0 in | Wt 205.0 lb

## 2021-07-24 DIAGNOSIS — Z1211 Encounter for screening for malignant neoplasm of colon: Secondary | ICD-10-CM

## 2021-07-24 NOTE — Progress Notes (Signed)
Pt verified name, DOB, address and insurance during PV today.  Pt mailed instruction packet of Emmi video, copy of consent form to read and not return, and instructions.   PV completed over the phone.  Pt encouraged to call with questions or issues.  My Chart instructions to pt as well    No egg or soy allergy known to patient  No issues known to pt with past sedation with any surgeries or procedures Patient denies ever being told they had issues or difficulty with intubation  No FH of Malignant Hyperthermia Pt is not on diet pills Pt is not on  home 02  Pt is not on blood thinners  Pt denies issues with constipation chronically- has an occasional issue of hard stools not daily -  No A fib or A flutter  EMMI video to pt or via [place] 19 guidelines implemented in PV today with Pt and RN   Pt is fully vaccinated  for Covid   Due to the COVID-19 pandemic we are asking patients to follow certain guidelines.  Pt aware of COVID protocols and [facility] guidelines

## 2021-08-02 ENCOUNTER — Other Ambulatory Visit: Payer: Self-pay | Admitting: Internal Medicine

## 2021-08-06 ENCOUNTER — Encounter: Payer: BC Managed Care – PPO | Admitting: Gastroenterology

## 2021-08-06 ENCOUNTER — Encounter: Payer: Self-pay | Admitting: Gastroenterology

## 2021-08-06 ENCOUNTER — Ambulatory Visit (AMBULATORY_SURGERY_CENTER): Payer: BC Managed Care – PPO | Admitting: Gastroenterology

## 2021-08-06 ENCOUNTER — Other Ambulatory Visit: Payer: Self-pay

## 2021-08-06 VITALS — BP 106/62 | HR 82 | Temp 98.6°F | Resp 13 | Ht 63.0 in | Wt 205.0 lb

## 2021-08-06 DIAGNOSIS — Z1211 Encounter for screening for malignant neoplasm of colon: Secondary | ICD-10-CM | POA: Diagnosis present

## 2021-08-06 DIAGNOSIS — K621 Rectal polyp: Secondary | ICD-10-CM

## 2021-08-06 DIAGNOSIS — D128 Benign neoplasm of rectum: Secondary | ICD-10-CM

## 2021-08-06 MED ORDER — SODIUM CHLORIDE 0.9 % IV SOLN: 500.0000 mL | Freq: Once | INTRAVENOUS | Status: DC

## 2021-08-06 NOTE — Progress Notes (Signed)
Pt's states no medical or surgical changes since previsit or office visit.  ° °Vitals CW °

## 2021-08-06 NOTE — Progress Notes (Signed)
PT taken to PACU. Monitors in place. VSS. Report given to RN. 

## 2021-08-06 NOTE — Progress Notes (Addendum)
Referring Provider: Hayden Rasmussen, MD Primary Care Physician:  Hayden Rasmussen, MD  Reason for Procedure:  Colon cancer screening   IMPRESSION:  Need for colon cancer screening Father with colon polyps  PLAN: Colonoscopy in the [place] today   HPI: Madison Cowan is a 60 y.o. female presents for screening colonoscopy.  No prior colonoscopy or colon cancer screening.  No baseline GI symptoms.   Father with colon polyps. No other known family history of colon cancer or polyps. No family history of uterine/endometrial cancer, pancreatic cancer or gastric/stomach cancer.   Past Medical History:  Diagnosis Date   Allergy    Zyrtec daily.   Anemia    as a child only   Arthritis    DDD lumbar spine   Asthma    Albuterol seasonally.   Chronic kidney disease    III A- CFR 53   Diabetes mellitus without complication (HCC)    Glaucoma    Heart murmur    since birth   Hyperlipidemia    Hypertension    Neuromuscular disorder ([place])    R third nerve palsy   OSA (obstructive sleep apnea) 11/11/2006   no CPAP   Seizures (HCC)    Thyroid disease    Thyroid nodules s/p needle biopsy negative.      Past Surgical History:  Procedure Laterality Date   CHOLECYSTECTOMY OPEN  11/11/1977   COLONOSCOPY      Current Outpatient Medications  Medication Sig Dispense Refill   amLODipine (NORVASC) 5 MG tablet Take 5 mg by mouth daily.     Ascorbic Acid (VITAMIN C) 100 MG tablet Take 100 mg by mouth daily.     aspirin 325 MG EC tablet Take 325 mg by mouth daily.     BD PEN NEEDLE NANO 2ND GEN 32G X 4 MM MISC USE FIVE TIMES A DAY FOR INSULIN 200 each 3   Cholecalciferol (VITAMIN D3 PO) Take by mouth. 5000 IU daily     Continuous Blood Gluc Receiver (FREESTYLE LIBRE 2 READER) DEVI USE DAILY TO MONITOR BLOOD GLUCOSE 6 each 3   Continuous Blood Gluc Sensor (FREESTYLE LIBRE 2 SENSOR) MISC 1 each by Does not apply route every 14 (fourteen) days. 6 each 3   hydrochlorothiazide  (HYDRODIURIL) 12.5 MG tablet Take 12.5 mg by mouth daily.     insulin detemir (LEVEMIR FLEXTOUCH) 100 UNIT/ML FlexPen INJECT 40 UNITS UNDER THE SKIN EVERY MORNING AND INJECT 50 UNITS EVERY EVENING 30 mL 3   JARDIANCE 25 MG TABS tablet TAKE ONE TABLET BY MOUTH DAILY 90 tablet 3   levocetirizine (XYZAL) 5 MG tablet Take 5 mg by mouth every evening.     losartan (COZAAR) 100 MG tablet Take 100 mg by mouth daily.     metFORMIN (GLUCOPHAGE) 1000 MG tablet Take 1 tablet (1,000 mg total) by mouth 2 (two) times daily. 180 tablet 3   NOVOLOG FLEXPEN 100 UNIT/ML FlexPen INJECT 15 TO 30 UNITS UNDER THE SKIN THREE TIMES A DAY WITH A MEAL 81 mL 1   ONETOUCH ULTRA test strip USE TO TEST BLOOD SUGAR THREE TO FOUR TIMES DAILY 300 strip 2   rosuvastatin (CRESTOR) 10 MG tablet TAKE ONE TABLET BY MOUTH DAILY 30 tablet 0   TRULICITY 3 XF/8.1WE SOPN INJECT 3 MG UNDER THE SKIN ONCE WEEKLY 2 mL 1   vitamin B-12 (CYANOCOBALAMIN) 500 MCG tablet Take 500 mcg by mouth daily.     Multiple Vitamins-Minerals (MULTIVITAMIN WITH MINERALS) tablet Take 1 tablet  by mouth daily. (Patient not taking: Reported on 08/06/2021)     pregabalin (LYRICA) 25 MG capsule Take 1-2 capsules (25-50 mg total) by mouth at bedtime. 60 capsule 5   SYMBICORT 160-4.5 MCG/ACT inhaler Inhale into the lungs. (Patient not taking: No sig reported)     Current Facility-Administered Medications  Medication Dose Route Frequency Provider Last Rate Last Admin   0.9 %  sodium chloride infusion  500 mL Intravenous Once Thornton Park, MD        Allergies as of 08/06/2021   (No Known Allergies)    Family History  Problem Relation Age of Onset   Diabetes Mother    Hypertension Mother    Ovarian cancer Mother 52   Breast cancer Mother 51   Cancer Mother 98       ovarian cancer age 51; breast cancer age 56   Diabetes Father    Heart disease Father 50       CAD/CABG; AAA; CHF   Hyperlipidemia Father    Hypertension Father    Prostate cancer Father         dx. 82-83   Colon polyps Father        section of colon removed; unsure of #   Skin cancer Sister    Heart disease Sister 81       3-vessel CAD s/p 1 stenting; CABG   Diabetes Sister    Hyperlipidemia Sister    Hypertension Sister    Heart disease Brother        Died of CHF at age 57.   Hyperlipidemia Brother    Hypertension Brother    Congestive Heart Failure Brother    Esophageal cancer Brother    Cancer Brother 14       GI- esophageal; smoker   Heart disease Brother        MIs in 16s and CABG.   Diabetes Brother    Hypertension Brother    Skin cancer Maternal Aunt        multiple - unknown type; dx. 28s   Pancreatic cancer Maternal Uncle        dx. 70s   Stroke Maternal Grandmother    Hypertension Maternal Grandmother    Skin cancer Maternal Grandfather        multiple - unknown types; dx. 73s   Cancer Maternal Grandfather    Heart Problems Paternal Grandfather    Crohn's disease Neg Hx    Rectal cancer Neg Hx    Stomach cancer Neg Hx    Colon cancer Neg Hx      Physical Exam: General:   Alert,  well-nourished, pleasant and cooperative in NAD Head:  Normocephalic and atraumatic. Eyes:  Sclera clear, no icterus.   Conjunctiva pink. Mouth:  No deformity or lesions.   Neck:  Supple; no masses or thyromegaly. Lungs:  Clear throughout to auscultation.   No wheezes. Heart:  Regular rate and rhythm; no murmurs. Abdomen:  Soft, non-tender, nondistended, normal bowel sounds, no rebound or guarding.  Msk:  Symmetrical. No boney deformities LAD: No inguinal or umbilical LAD Extremities:  No clubbing or edema. Neurologic:  Alert and  oriented x4;  grossly nonfocal Skin:  No obvious rash or bruise. Psych:  Alert and cooperative. Normal mood and affect.     [name] L. Tarri Glenn, MD, MPH 08/06/2021, 11:07 AM

## 2021-08-06 NOTE — Progress Notes (Signed)
Called to room to assist during endoscopic procedure.  Patient ID and intended procedure confirmed with present staff. Received instructions for my participation in the procedure from the performing physician.  

## 2021-08-06 NOTE — Patient Instructions (Signed)
Handouts given on diverticulosis, polyps, and high fiber diet.  Await pathology results.  Repeat colonoscopy for surveillance will be determined based off pathology results.    YOU HAD AN ENDOSCOPIC PROCEDURE TODAY AT [facility]:   Refer to the procedure report that was given to you for any specific questions about what was found during the examination.  If the procedure report does not answer your questions, please call your gastroenterologist to clarify.  If you requested that your care partner not be given the details of your procedure findings, then the procedure report has been included in a sealed envelope for you to review at your convenience later.  YOU SHOULD EXPECT: Some feelings of bloating in the abdomen. Passage of more gas than usual.  Walking can help get rid of the air that was put into your GI tract during the procedure and reduce the bloating. If you had a lower endoscopy (such as a colonoscopy or flexible sigmoidoscopy) you may notice spotting of blood in your stool or on the toilet paper. If you underwent a bowel prep for your procedure, you may not have a normal bowel movement for a few days.  Please Note:  You might notice some irritation and congestion in your nose or some drainage.  This is from the oxygen used during your procedure.  There is no need for concern and it should clear up in a day or so.  SYMPTOMS TO REPORT IMMEDIATELY:  Following lower endoscopy (colonoscopy or flexible sigmoidoscopy):  Excessive amounts of blood in the stool  Significant tenderness or worsening of abdominal pains  Swelling of the abdomen that is new, acute  Fever of 100F or higher   For urgent or emergent issues, a gastroenterologist can be reached at any hour by calling [phone]. Do not use MyChart messaging for urgent concerns.    DIET:  We do recommend a small meal at first, but then you may proceed to your regular diet.  Drink plenty of fluids but you should  avoid alcoholic beverages for 24 hours.  ACTIVITY:  You should plan to take it easy for the rest of today and you should NOT DRIVE or use heavy machinery until tomorrow (because of the sedation medicines used during the test).    FOLLOW UP: Our staff will call the number listed on your records 48-72 hours following your procedure to check on you and address any questions or concerns that you may have regarding the information given to you following your procedure. If we do not reach you, we will leave a message.  We will attempt to reach you two times.  During this call, we will ask if you have developed any symptoms of COVID 19. If you develop any symptoms (ie: fever, flu-like symptoms, shortness of breath, cough etc.) before then, please call [phone].  If you test positive for Covid 19 in the 2 weeks post procedure, please call and report this information to [place].    If any biopsies were taken you will be contacted by phone or by letter within the next 1-3 weeks.  Please call us at [phone] if you have not heard about the biopsies in 3 weeks.    SIGNATURES/CONFIDENTIALITY: You and/or your care partner have signed paperwork which will be entered into your electronic medical record.  These signatures attest to the fact that that the information above on your After Visit Summary has been reviewed and is understood.  Full responsibility of the confidentiality of  this discharge information lies with you and/or your care-partner.

## 2021-08-06 NOTE — Op Note (Addendum)
[facility] Patient Name: Madison Cowan Procedure Date: 08/06/2021 11:09 AM MRN: [id number] Endoscopist: Thornton Park MD, MD Age: 60 Referring MD:  Date of Birth: 04/10/1961 Gender: Female Account #: [id number] Procedure:                Colonoscopy Indications:              Screening for colorectal malignant neoplasm, This                            is the patient's first colonoscopy                           Father with colon polyps Medicines:                Monitored Anesthesia Care Procedure:                Pre-Anesthesia Assessment:                           - Prior to the procedure, a History and Physical                            was performed, and patient medications and                            allergies were reviewed. The patient's tolerance of                            previous anesthesia was also reviewed. The risks                            and benefits of the procedure and the sedation                            options and risks were discussed with the patient.                            All questions were answered, and informed consent                            was obtained. Prior Anticoagulants: The patient has                            taken no previous anticoagulant or antiplatelet                            agents. ASA Grade Assessment: II - A patient with                            mild systemic disease. After reviewing the risks                            and benefits, the patient was deemed in  satisfactory condition to undergo the procedure.                           After obtaining informed consent, the colonoscope                            was passed under direct vision. Throughout the                            procedure, the patient's blood pressure, pulse, and                            oxygen saturations were monitored continuously. The                            CF [id number] #[id number] was introduced through the  anus                            and advanced to the 4 cm into the ileum. A second                            forward view of the right colon was performed. The                            colonoscopy was performed without difficulty. The                            patient tolerated the procedure well. The quality                            of the bowel preparation was adequate. Over 755mL                            of light brown liquid were removed during the                            procedure. The terminal ileum, ileocecal valve,                            appendiceal orifice, and rectum were photographed. Scope In: 11:19:48 AM Scope Out: 11:34:53 AM Scope Withdrawal Time: 0 hours 11 minutes 34 seconds  Total Procedure Duration: 0 hours 15 minutes 5 seconds  Findings:                 The perianal and digital rectal examinations were                            normal.                           Two sessile polyps were found in the rectum. The                            polyps were 2 to 4 mm  in size. These polyps were                            removed with a cold snare. Resection and retrieval                            were complete. Estimated blood loss was minimal.                           Multiple small and large-mouthed diverticula were                            found in the sigmoid colon and distal descending                            colon.                           The exam was otherwise without abnormality on                            direct and retroflexion views. Complications:            No immediate complications. Estimated blood loss:                            Minimal. Estimated Blood Loss:     Estimated blood loss was minimal. Impression:               - Two 2 to 4 mm polyps in the rectum, removed with                            a cold snare. Resected and retrieved.                           - Diverticulosis in the sigmoid colon and in the                             distal descending colon.                           - The examination was otherwise normal on direct                            and retroflexion views. Recommendation:           - Patient has a contact number available for                            emergencies. The signs and symptoms of potential                            delayed complications were discussed with the                            patient. Return to normal activities tomorrow.  Written discharge instructions were provided to the                            patient.                           - High fiber diet.                           - Continue present medications.                           - Await pathology results.                           - Repeat colonoscopy date to be determined after                            pending pathology results are reviewed for                            surveillance. Plan to use a different bowel prep at                            that time.                           - Emerging evidence supports eating a diet of                            fruits, vegetables, grains, calcium, and yogurt                            while reducing red meat and alcohol may reduce the                            risk of colon cancer.                           - Thank you for allowing me to be involved in your                            colon cancer prevention. Thornton Park MD, MD 08/06/2021 11:39:43 AM This report has been signed electronically.

## 2021-08-08 ENCOUNTER — Telehealth: Payer: Self-pay | Admitting: *Deleted

## 2021-08-08 NOTE — Telephone Encounter (Signed)
  Follow up Call-  Call back number 08/06/2021  Post procedure Call Back phone  # [phone]  Permission to leave phone message Yes  Some recent data might be hidden     Patient questions:   Message left to call if necessary.

## 2021-08-08 NOTE — Telephone Encounter (Signed)
  Follow up Call-  Call back number 08/06/2021  Post procedure Call Back phone  # [phone]  Permission to leave phone message Yes  Some recent data might be hidden     Patient questions:  Do you have a fever, pain , or abdominal swelling? No. Pain Score  0 *  Have you tolerated food without any problems? Yes.    Have you been able to return to your normal activities? Yes.    Do you have any questions about your discharge instructions: Diet   No. Medications  No. Follow up visit  No.  Do you have questions or concerns about your Care? No.  Actions: * If pain score is 4 or above: No action needed, pain <4.  Have you developed a fever since your procedure? no  2.   Have you had an respiratory symptoms (SOB or cough) since your procedure? No  3.   Have you tested positive for COVID 19 since your procedure no  4.   Have you had any family members/close contacts diagnosed with the COVID 19 since your procedure?  no   If yes to any of these questions please route to Joylene John, RN and Joella Prince, RN

## 2021-08-10 ENCOUNTER — Encounter: Payer: Self-pay | Admitting: Gastroenterology

## 2021-09-04 ENCOUNTER — Ambulatory Visit: Payer: BC Managed Care – PPO | Admitting: Internal Medicine

## 2021-09-04 ENCOUNTER — Encounter: Payer: Self-pay | Admitting: Internal Medicine

## 2021-09-04 ENCOUNTER — Other Ambulatory Visit: Payer: Self-pay

## 2021-09-04 VITALS — BP 138/82 | HR 95 | Ht 63.0 in | Wt 202.0 lb

## 2021-09-04 DIAGNOSIS — E042 Nontoxic multinodular goiter: Secondary | ICD-10-CM

## 2021-09-04 DIAGNOSIS — E785 Hyperlipidemia, unspecified: Secondary | ICD-10-CM | POA: Diagnosis not present

## 2021-09-04 DIAGNOSIS — E1142 Type 2 diabetes mellitus with diabetic polyneuropathy: Secondary | ICD-10-CM | POA: Diagnosis not present

## 2021-09-04 DIAGNOSIS — Z23 Encounter for immunization: Secondary | ICD-10-CM | POA: Diagnosis not present

## 2021-09-04 MED ORDER — NOVOLOG FLEXPEN 100 UNIT/ML ~~LOC~~ SOPN: PEN_INJECTOR | SUBCUTANEOUS | 3 refills | Status: DC

## 2021-09-04 NOTE — Progress Notes (Signed)
Patient ID: Roseanna Koplin, female   DOB: 1961/02/26, 60 y.o.   MRN: [id number]   This visit occurred during the SARS-CoV-2 public health emergency.  Safety protocols were in place, including screening questions prior to the visit, additional usage of staff PPE, and extensive cleaning of exam room while observing appropriate contact time as indicated for disinfecting solutions.   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 Last visit 4 months ago.  Interim hx: No increased urination, blurry vision, nausea, chest pain. She just retired from a high stress job in 05/2021.  She greatly enjoys retirement.  She is seeing her sister more (she lives 4.5 hours away) and enjoying crafts.  DM2: Reviewed HbA1c levels: 08/31/2021: HbA1c 6.3% 04/25/2021: HbA1c 7.2% Lab Results  Component Value Date   HGBA1C 7.0 (A) 12/28/2020   HGBA1C 6.8 (A) 04/20/2020   HGBA1C 7.3 (A) 12/15/2019   HGBA1C 6.4 (A) 08/09/2019   HGBA1C 6.7 (A) 12/16/2018   HGBA1C 7.2 (A) 08/12/2018   HGBA1C 6.6 (A) 04/10/2018   HGBA1C 6.8 10/28/2017   HGBA1C 6.2 07/29/2017   HGBA1C 6.4 04/23/2017   HGBA1C 9.3 12/12/2016   HGBA1C 11.3 09/10/2016   HGBA1C 13.3 05/07/2016   HGBA1C 7.9 (H) 12/19/2015   HGBA1C 8.0 09/08/2015   HGBA1C 7.8 05/31/2015   HGBA1C 8.1 (H) 01/18/2015   HGBA1C 9.3 10/05/2014   HGBA1C 8.2 05/06/2014   HGBA1C 7.9 01/05/2014  01/14/2020: HbA1c 6.7%  She is on: - Metformin 1000 mg 2x a day with meals. - Jardiance 25 mg daily before b'fast - Trulicity 1.5 >> 3 mg weekly - Levemir 50 units in a.m. And 70 units at bedtime >> 40 units in a.m. and 47-50 units at bedtime - Novolog >> Humalog 20-25 >> 17-20 >> 20-30 units before meals  Pt checks her sugars 4 times a day:   Prev.: - am:  134, 145-189 >> 91-130s >> 103-243>> 110-190, 197 - 2h after b'fast:  76 >> n/c >> 131-144, 220 >> n/c >> 148 - before lunch: 115, 158 >> see above  >> 117-147 >> 107 - 2h after lunch: 175, 184, 210 >> 65, 70-120, 144 >> 162 >> 111-154 - before dinner:  92-111, 163 >> 75,  100-133, 170 >> 84-169, 190 - 2h after dinner: 183 >> n/c >> 143-187, 202 >> 99-148, 213 - bedtime: 150, 161 >> n/c >> 125, 186 >> 70, 95 >> n/c >> n/c - nighttime: n/c >> 133 >> n/c >> 107 Lowest sugar was 65 (2h after lunch) >> 75>> 84 >> 67; she has hypoglycemia awareness in the 90s. Highest sugar was 220 (am- forgot meds the night before) >> 243 (forgot levemir and metformin) >> 213 >> 200.  Glucometer: One Touch ultra  Pt's meals are: - Breakfast: protein shakes (6g carbs); McDonads >> usually skips - Lunch: may skip; salads cheeseburger; school cafeteria food >> may skip - Dinner: fast food; porc chops + collard greens + salad; hamburger; McNuggets - Snacks: sometimes; sweets, nuts, chips She does not have organized mealtimes.  -Mild CKD, last BUN/creatinine:  08/31/2021: 20/1.04, GFR 66 04/25/2021: 18/1.12, GFR 53, Glu 144 01/14/2020: CMP normal with the exception of a high glucose at 116.  BUN/creatinine 19/0.9, GFR 69 Lab Results  Component Value Date   BUN 25 12/16/2018   BUN 20 10/15/2017   CREATININE 1.09 (H) 12/16/2018  CREATININE 0.92 10/15/2017  On Losartan.  -+ HL. Last set of lipids: 08/31/2021: 114/97/41/54 04/25/2021: 137/136/43/67 01/14/2020: 126/183/34/55 Lab Results  Component Value Date   CHOL 98 12/16/2018   HDL 29.30 (L) 12/16/2018   LDLCALC 44 12/16/2018   TRIG 120.0 12/16/2018   CHOLHDL 3 12/16/2018  On Crestor 10.  - last eye exam was in 02/2020: No DR.  She is a glaucoma suspect.  - + Numbness, tingling, and also burning in her feet.  She continues on Lyrica, B complex, alpha lipoic acid.  She also has HTN.  Thyroid nodules:  Reviewed her imaging reports and biopsies: Thyroid ultrasound (01/03/2016): Dominant nodule is a right mid lobe  hypoechoic nodule of 2.1 x 0.8 x 1.2 cm, and previously measured up to 2.3 cm. There is one left thyroid nodule measuring 6 mm and several other tiny scattered nodules.  She had a benign biopsy of the dominant nodule.  Thyroid ultrasound (04/29/2017): Previously biopsied right nodule is unchanged.  She has 2 additional subcentimeter thyroid nodules, that did not require follow-up.  Also, a hypoechoic lymph node on the right side of the neck, 1.9 cm x 0.5 cm.  Neck CT (11/19/2017): Thyroid: Small thyroid nodules as seen at ultrasound. No worrisome finding by CT. Largest nodule on the right measures 1.7 cm. Lymph nodes: No enlarged or low-density nodes on either side of the neck. Symmetric normal appearing lymph nodes by size criteria.  She had another thyroid U/S 10/31/2020.  After this, she was advised that one of the thyroid nodules need to be biopsied.  I was able to review the report in care everywhere: ISTHMUS:  - Size: 0.3 cm.   RIGHT LOBE:  - Size: 4.6 x 1.9 x 1.6 cm.  - Echogenicity: Heterogeneous.   LEFT LOBE:  - Size: 2.7 x 1.7 x 1.6 cm.  - Echogenicity: Heterogeneous.   NODULES:   Nodule 1:  -- Size: 1.8 x 1 x 1.3 cm (long x AP x trans).  -- Location: Right lower.  -- Composition: solid or almost completely solid (2)  -- Echogenicity: very hypoechoic (3)  -- Shape: wider-than-tall (0)  -- Margins: smooth (0)  -- Echogenic foci: punctate echogenic foci (3)  -- ACR TI-RADS total points and risk category: 8 Points - TR5.   Nodule 2:  -- Size: 0.9 x 0.7 x 1 cm (long x AP x trans).  -- Location: Right lower.  -- Composition: solid or almost completely solid (2)  -- Echogenicity: hypoechoic (2)  -- Shape: wider-than-tall (0)  -- Margins: smooth (0)  -- Echogenic foci: none (0)  -- ACR TI-RADS total points and risk category: 4 Points - TR4.   Nodule 3:  -- Size: 0.7 x 0.5 x 0.7 cm (long x AP x trans).  -- Location: Right lower.  -- Composition: cystic or almost  completely cystic (0)  -- Echogenicity: anechoic (0)  -- Shape: wider-than-tall (0)  -- Margins: smooth (0)  -- Echogenic foci: peripheral calcifications (2)  -- ACR TI-RADS total points and risk category: 2 Points - TR2.   Nodule 4:  -- Size: 0.9 x 0.7 x 0.7 cm (long x AP x trans).  -- Location: Left lower.  -- Composition: solid or almost completely solid (2)  -- Echogenicity: hypoechoic (2)  -- Shape: wider-than-tall (0)  -- Margins: smooth (0)  -- Echogenic foci: peripheral calcifications (2)  -- ACR TI-RADS total points and risk category: 6 Points - TR4.   MISCELLANEOUS:  There are other  scattered less than 5 mm nodules or cysts throughout both lobes.  Pt denies: - feeling nodules in neck - hoarseness - dysphagia - choking - SOB with lying down  Review latest TFTs: 04/25/2021: TSH 1.66 01/14/2020: TSH 1.12 Lab Results  Component Value Date   TSH 1.69 12/16/2018   Before the coronavirus pandemic, she was meeting with friends one afternoon a month to play board games.  On Amlodipine now.  ROS: + see HPI  I reviewed pt's medications, allergies, PMH, social hx, family hx, and changes were documented in the history of present illness. Otherwise, unchanged from my initial visit note.  Past Medical History:  Diagnosis Date   Allergy    Zyrtec daily.   Anemia    as a child only   Arthritis    DDD lumbar spine   Asthma    Albuterol seasonally.   Chronic kidney disease    III A- CFR 53   Diabetes mellitus without complication (HCC)    Glaucoma    Heart murmur    since birth   Hyperlipidemia    Hypertension    Neuromuscular disorder ([place])    R third nerve palsy   OSA (obstructive sleep apnea) 11/11/2006   no CPAP   Seizures (HCC)    Thyroid disease    Thyroid nodules s/p needle biopsy negative.    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  [place] work.      Tobacco: 1/2 pdd x 20 years.      Alcohol:  None      Drugs: none       Education: [employer].       Exercise: No.      Seatbelt: 100%      Guns: unloaded.      Sexually active: none; total partners = 5.  Last STD screen 2006.     Current Outpatient Medications on File Prior to Visit  Medication Sig Dispense Refill   amLODipine (NORVASC) 5 MG tablet Take 5 mg by mouth daily.     Ascorbic Acid (VITAMIN C) 100 MG tablet Take 100 mg by mouth daily.     aspirin 325 MG EC tablet Take 325 mg by mouth daily.     BD PEN NEEDLE NANO 2ND GEN 32G X 4 MM MISC USE FIVE TIMES A DAY FOR INSULIN 200 each 3   Cholecalciferol (VITAMIN D3 PO) Take by mouth. 5000 IU daily     Continuous  Blood Gluc Receiver (FREESTYLE LIBRE 2 READER) DEVI USE DAILY TO MONITOR BLOOD GLUCOSE 6 each 3   Continuous Blood Gluc Sensor (FREESTYLE LIBRE 2 SENSOR) MISC 1 each by Does not apply route every 14 (fourteen) days. 6 each 3   hydrochlorothiazide (HYDRODIURIL) 12.5 MG tablet Take 12.5 mg by mouth daily.     insulin detemir (LEVEMIR FLEXTOUCH) 100 UNIT/ML FlexPen INJECT 40 UNITS UNDER THE SKIN EVERY MORNING AND INJECT 50 UNITS EVERY EVENING 30 mL 3   JARDIANCE 25 MG TABS tablet TAKE ONE TABLET BY MOUTH DAILY 90 tablet  3   levocetirizine (XYZAL) 5 MG tablet Take 5 mg by mouth every evening.     losartan (COZAAR) 100 MG tablet Take 100 mg by mouth daily.     metFORMIN (GLUCOPHAGE) 1000 MG tablet Take 1 tablet (1,000 mg total) by mouth 2 (two) times daily. 180 tablet 3   Multiple Vitamins-Minerals (MULTIVITAMIN WITH MINERALS) tablet Take 1 tablet by mouth daily. (Patient not taking: Reported on 08/06/2021)     NOVOLOG FLEXPEN 100 UNIT/ML FlexPen INJECT 15 TO 30 UNITS UNDER THE SKIN THREE TIMES A DAY WITH A MEAL 81 mL 1   ONETOUCH ULTRA test strip USE TO TEST BLOOD SUGAR THREE TO FOUR TIMES DAILY 300 strip 2   pregabalin (LYRICA) 25 MG capsule Take 1-2 capsules (25-50 mg total) by mouth at bedtime. 60 capsule 5   rosuvastatin (CRESTOR) 10 MG tablet TAKE ONE TABLET BY MOUTH DAILY 30 tablet 0   SYMBICORT 160-4.5 MCG/ACT inhaler Inhale into the lungs. (Patient not taking: No sig reported)     TRULICITY 3 NA/3.5TD SOPN INJECT 3 MG UNDER THE SKIN ONCE WEEKLY 2 mL 1   vitamin B-12 (CYANOCOBALAMIN) 500 MCG tablet Take 500 mcg by mouth daily.     No current facility-administered medications on file prior to visit.   No Known Allergies Family History  Problem Relation Age of Onset   Diabetes Mother    Hypertension Mother    Ovarian cancer Mother 75   Breast cancer Mother 48   Cancer Mother 38       ovarian cancer age 54; breast cancer age 61   Diabetes Father    Heart disease Father 55       CAD/CABG; AAA; CHF   Hyperlipidemia Father    Hypertension Father    Prostate cancer Father        dx. 82-83   Colon polyps Father        section of colon removed; unsure of #   Skin cancer Sister    Heart disease Sister 28       3-vessel CAD s/p 1 stenting; CABG   Diabetes Sister    Hyperlipidemia Sister    Hypertension Sister    Heart disease Brother        Died of CHF at age 66.   Hyperlipidemia Brother    Hypertension Brother    Congestive Heart Failure Brother    Esophageal cancer Brother    Cancer Brother  30       GI- esophageal; smoker   Heart disease Brother        MIs in 54s and CABG.   Diabetes Brother    Hypertension Brother    Skin cancer Maternal Aunt        multiple - unknown type; dx. 53s   Pancreatic cancer Maternal Uncle        dx. 70s   Stroke Maternal Grandmother    Hypertension Maternal  Grandmother    Skin cancer Maternal Grandfather        multiple - unknown types; dx. 72s   Cancer Maternal Grandfather    Heart Problems Paternal Grandfather    Crohn's disease Neg Hx    Rectal cancer Neg Hx    Stomach cancer Neg Hx    Colon cancer Neg Hx    PE: BP 138/82 (BP Location: Left Arm, Patient Position: Sitting, Cuff Size: Normal)   Pulse 95   Ht 5\' 3"  (1.6 m)   Wt 202 lb (91.6 kg)   SpO2 98%   BMI 35.78 kg/m  Wt Readings from Last 3 Encounters:  09/04/21 202 lb (91.6 kg)  08/06/21 205 lb (93 kg)  07/24/21 205 lb (93 kg)   Constitutional: overweight, in NAD Eyes: PERRLA, EOMI, no exophthalmos ENT: moist mucous membranes, no thyromegaly, no cervical lymphadenopathy Cardiovascular: RRR, No MRG Respiratory: CTA B Gastrointestinal: abdomen soft, NT, ND, BS+ Musculoskeletal: no deformities, strength intact in all 4 Skin: moist, warm, no rashes Neurological: no tremor with outstretched hands, DTR normal in all 4  ASSESSMENT: 1. DM2, insulin-dependent, now controlled, with complications - Peripheral neuropathy.  2. MNG  3. HL  PLAN:  1. Patient with longstanding, fairly well-controlled type 2 diabetes, on a complex medication regimen including metformin, SGLT2 inhibitor, weekly GLP-1 receptor agonist and high doses of basal-bolus insulin.  At last visit, sugars at home were above target mostly before meals and they were usually at goal after meals.  She was missing metformin and Levemir doses at night and I strongly advised her to try to remember to take them and maybe even setting alarms on her phone for this.  I also suggested a CGM at that time and sent a  prescription to her pharmacy. At last visit, HbA1c was 7.2%, slightly higher.  However, 4 days ago, she had another HbA1c checked by PCP and this was excellent, improved, at 6.3%. -Since last visit, she retired and she greatly enjoys retirement and mentions that her stress has been reduced considerably -at today's visit, she is on a CGM, which she loves. CGM interpretation -At today's visit, we reviewed her CGM downloads: It appears that 93% of values are in target range (goal >70%), while 7% are higher than 180 (goal <25%), and 0% are lower than 70 (goal <4%).  The calculated average blood sugar is 131.  The projected HbA1c for the next 3 months ([facility]MI) is 6.4%. -Reviewing the CGM trends, sugars are excellent throughout the day, slightly higher after meals, but within the target range.  However, they are dropping overnight usually to normal levels, in the 80s and 90s, but occasionally to upper 60s.  She does feel these >> I advised her to reduce the dose of Levemir at night slightly. -Otherwise, we can continue the same doses of rapid acting insulin before meals and the rest of the regimen.  We will plan to reduce the doses of her insulin more after the holidays. - I suggested to:  Patient Instructions  Please continue: - Metformin 1000 mg 2x a day with meals. - Jardiance 25 mg daily before b'fast - Trulicity 3 mcg weekly - Humalog/Novolog 20-30 units before meals  Decrease: - Levemir 40 units in a.m. and 45-47 units at bedtime  Please return in 4 months.  - advised to check sugars at different times of the day - 4x a day, rotating check times - advised for yearly eye exams >> she is due - return to clinic  in 4 months  2. MNG -Reviewed her thyroid ultrasound report from 2019: Stable right dominant nodule, which was previously biopsied with benign results.  She had a neck CT afterwards which did not show any worrisome nodules in her neck. -She saw her PCP in 10/2020, and at that time the  nodules were felt to be enlarged.  Another thyroid ultrasound was checked (I do not have the images, only the report) and this showed mostly subcentimeter nodules while the dominant right nodule was 1.8 cm in the largest dimension.  This nodule was biopsied with benign results in the past and, per review of records, this has actually decreased in size on the last ultrasound.  Therefore, I did not suggest any biopsy at that time. -We will plan to repeat an ultrasound at next visit -No neck compression symptoms  -TSH was normal in 04/2021  3. HL -Reviewed latest lipid panel from 4 days ago: All fractions at goal -She continues on Crestor 10 mg daily without side effects  + flu shot today  Philemon Kingdom, MD PhD [facility] Endocrinology

## 2021-09-04 NOTE — Patient Instructions (Addendum)
Please continue: - Metformin 1000 mg 2x a day with meals. - Jardiance 25 mg daily before b'fast - Trulicity 3 mcg weekly - Humalog/Novolog 20-30 units before meals  Decrease: - Levemir 40 units in a.m. and 45-47 units at bedtime  Please return in 4 months.

## 2021-09-06 ENCOUNTER — Other Ambulatory Visit: Payer: Self-pay | Admitting: Internal Medicine

## 2021-09-11 ENCOUNTER — Other Ambulatory Visit (HOSPITAL_COMMUNITY): Payer: Self-pay

## 2021-09-12 ENCOUNTER — Other Ambulatory Visit: Payer: Self-pay | Admitting: Internal Medicine

## 2021-10-09 ENCOUNTER — Other Ambulatory Visit: Payer: Self-pay | Admitting: Internal Medicine

## 2021-11-14 ENCOUNTER — Other Ambulatory Visit: Payer: Self-pay | Admitting: Internal Medicine

## 2021-11-27 ENCOUNTER — Other Ambulatory Visit: Payer: Self-pay | Admitting: Internal Medicine

## 2021-11-27 ENCOUNTER — Telehealth: Payer: Self-pay | Admitting: Internal Medicine

## 2021-11-27 DIAGNOSIS — E1142 Type 2 diabetes mellitus with diabetic polyneuropathy: Secondary | ICD-10-CM

## 2021-11-27 MED ORDER — "SYRINGE 27G X 1-1/4"" 3 ML MISC": 0 refills | Status: AC

## 2021-11-27 MED ORDER — INSULIN DETEMIR 100 UNIT/ML ~~LOC~~ SOLN: SUBCUTANEOUS | 2 refills | Status: DC

## 2021-11-27 MED ORDER — LANTUS SOLOSTAR 100 UNIT/ML ~~LOC~~ SOPN: PEN_INJECTOR | SUBCUTANEOUS | 3 refills | Status: DC

## 2021-11-27 NOTE — Telephone Encounter (Signed)
I sent Lantus

## 2021-11-27 NOTE — Telephone Encounter (Signed)
Patient requests to be called at ph# [phone] re: Patient states PHARM is unable to provide Levemire. Patient requests to be advise on what she should do. Patient states she took her last dose of Levemire this morning and is currently on high doses of Prednisone. Patient states she is due to take Levemire again tonight.

## 2021-11-27 NOTE — Telephone Encounter (Signed)
Yes, sure, can you please send a vial of Levemir and 50 syringes?

## 2021-11-27 NOTE — Telephone Encounter (Signed)
Levemir vials and syringes have now been sent into pharmacy

## 2021-11-27 NOTE — Telephone Encounter (Signed)
Spoke with patient's pharmacy regarding Levemir. Pharmacist says that Levemir is currently on back order and isnt available at the moment. Recommends alternative long acting insulin such as Lantus or Toujeo or patient can choose different pharmacy.

## 2021-11-27 NOTE — Telephone Encounter (Signed)
Patient call again and request a new prescription for a vial of Levemir. Which the pharmacy states they have. Syringes for this medication will be needed also.

## 2021-11-27 NOTE — Addendum Note (Signed)
Addended by: Sarina Ill on: 11/27/2021 04:05 PM   Modules accepted: Orders

## 2021-11-28 ENCOUNTER — Other Ambulatory Visit: Payer: Self-pay | Admitting: Internal Medicine

## 2021-12-21 ENCOUNTER — Telehealth: Payer: Self-pay

## 2021-12-21 DIAGNOSIS — E1142 Type 2 diabetes mellitus with diabetic polyneuropathy: Secondary | ICD-10-CM

## 2021-12-21 MED ORDER — TRULICITY 3 MG/0.5ML ~~LOC~~ SOAJ: SUBCUTANEOUS | 1 refills | Status: DC

## 2021-12-21 NOTE — Telephone Encounter (Signed)
Pt called and requested rx be sent to alternate pharmacy.

## 2022-01-08 ENCOUNTER — Ambulatory Visit: Payer: BC Managed Care – PPO | Admitting: Internal Medicine

## 2022-01-08 ENCOUNTER — Other Ambulatory Visit: Payer: Self-pay

## 2022-01-08 ENCOUNTER — Encounter: Payer: Self-pay | Admitting: Internal Medicine

## 2022-01-08 ENCOUNTER — Other Ambulatory Visit: Payer: Self-pay | Admitting: Internal Medicine

## 2022-01-08 VITALS — BP 128/72 | HR 104 | Ht 63.0 in | Wt 204.0 lb

## 2022-01-08 DIAGNOSIS — E1142 Type 2 diabetes mellitus with diabetic polyneuropathy: Secondary | ICD-10-CM

## 2022-01-08 DIAGNOSIS — E042 Nontoxic multinodular goiter: Secondary | ICD-10-CM | POA: Diagnosis not present

## 2022-01-08 DIAGNOSIS — E785 Hyperlipidemia, unspecified: Secondary | ICD-10-CM | POA: Diagnosis not present

## 2022-01-08 LAB — POCT GLYCOSYLATED HEMOGLOBIN (HGB A1C): Hemoglobin A1C: 6.8 % — AB (ref 4.0–5.6)

## 2022-01-08 MED ORDER — INSULIN DETEMIR 100 UNIT/ML ~~LOC~~ SOLN: SUBCUTANEOUS | 3 refills | Status: DC

## 2022-01-08 NOTE — Patient Instructions (Addendum)
Please continue: - Metformin 1000 mg 2x a day with meals. - Jardiance 25 mg daily before b'fast - Trulicity 3 mcg weekly - Novolog 20-30 units before meals  Decrease: - Levemir 40 units in a.m. and 40 units at bedtime  Please return in 4 months.

## 2022-01-08 NOTE — Progress Notes (Addendum)
Patient ID: Madison Cowan, female   DOB: Nov 07, 1961, 61 y.o.   MRN: [id number]   This visit occurred during the SARS-CoV-2 public health emergency.  Safety protocols were in place, including screening questions prior to the visit, additional usage of staff PPE, and extensive cleaning of exam room while observing appropriate contact time as indicated for disinfecting solutions.   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 Last visit 4 months ago.  Interim hx: No increased urination, nausea, chest pain.  She does have some blurry vision. She just retired from a high stress job in 05/2021.  She greatly enjoys retirement.  In 10/2021, she was found to have papilledema and had extensive investigation for this.  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  She was started on Diamox.  Her headaches improved. She was recently on Prednisone for 3 weeks >> sugars higher.  DM2: Reviewed HbA1c levels: 11/09/2021: HbA1c 6.8% 08/31/2021: HbA1c 6.3% 04/25/2021: HbA1c 7.2% Lab Results  Component Value Date   HGBA1C 7.0 (A) 12/28/2020   HGBA1C 6.8 (A) 04/20/2020   HGBA1C 7.3 (A) 12/15/2019   HGBA1C 6.4 (A) 08/09/2019   HGBA1C 6.7 (A) 12/16/2018   HGBA1C 7.2 (A) 08/12/2018   HGBA1C 6.6 (A) 04/10/2018   HGBA1C 6.8 10/28/2017   HGBA1C 6.2 07/29/2017   HGBA1C 6.4 04/23/2017   HGBA1C 9.3 12/12/2016   HGBA1C 11.3 09/10/2016   HGBA1C 13.3 05/07/2016   HGBA1C 7.9 (H) 12/19/2015   HGBA1C 8.0 09/08/2015   HGBA1C 7.8 05/31/2015   HGBA1C 8.1 (H) 01/18/2015   HGBA1C 9.3 10/05/2014   HGBA1C 8.2 05/06/2014   HGBA1C 7.9 01/05/2014  01/14/2020: HbA1c 6.7%  She is on: - Metformin 1000 mg 2x a day with meals. - Jardiance 25 mg daily before b'fast - Trulicity 1.5 >> 3 mg weekly - Levemir 50 units in a.m. And  70 units at bedtime >> 40 units in a.m. and 47-50 >> 45-47 units at bedtime - Novolog >> Humalog 20-25 >> 17-20 >> 20-30 units before meals  Pt checks her sugars 4 times a day:   Previously:   Lowest sugar was 75 >> 84 >> 67 >> 50s; she has hypoglycemia awareness in the 90s. Highest sugar was 243 (forgot levemir and metformin) >> 213 >> 200 >> upper 200s.  Glucometer: One Touch ultra  Pt's meals are: - Breakfast: protein shakes (6g carbs); McDonads >> usually skips - Lunch: may skip; salads cheeseburger; school cafeteria food >> may skip - Dinner: fast food; porc chops + collard greens + salad; hamburger; McNuggets - Snacks: sometimes; sweets, nuts, chips She does not have organized mealtimes.  -Mild CKD, last BUN/creatinine:  08/31/2021: 20/1.04, GFR 66 04/25/2021: 18/1.12, GFR 53, Glu 144 01/14/2020: CMP normal with the exception of a high glucose at 116.  BUN/creatinine 19/0.9, GFR 69 Lab Results  Component Value Date   BUN 25 12/16/2018   BUN 20 10/15/2017   CREATININE 1.09 (H) 12/16/2018   CREATININE 0.92 10/15/2017  On Losartan.  -+ HL. Last set of lipids: 08/31/2021: 114/97/41/54 04/25/2021: 137/136/43/67 01/14/2020: 126/183/34/55 Lab Results  Component Value Date   CHOL 98 12/16/2018   HDL 29.30 (L) 12/16/2018   LDLCALC 44 12/16/2018   TRIG 120.0 12/16/2018   CHOLHDL 3 12/16/2018  On Crestor 10.  - last eye exam was in 10/2021: papilledema >> sent  to ED directly >> CT head, then MRI/MRV, spinal tap, temporal aa. Bx (was on 60 mg Prednisone x3 weeks).  Conclusion: Idiopathic intracranial HTN. No DR.  She is a glaucoma suspect.  - + Numbness, tingling, and also burning in her feet.  She continues on Lyrica, B complex, alpha lipoic acid.  She is up-to-date with foot exam (05/2021).  She also has HTN.  Thyroid nodules:  Reviewed her imaging reports and biopsies: Thyroid ultrasound (01/03/2016): Dominant nodule is a right mid lobe hypoechoic nodule of 2.1 x 0.8 x  1.2 cm, and previously measured up to 2.3 cm. There is one left thyroid nodule measuring 6 mm and several other tiny scattered nodules.  She had a benign biopsy of the dominant nodule.  Thyroid ultrasound (04/29/2017): Previously biopsied right nodule is unchanged.  She has 2 additional subcentimeter thyroid nodules, that did not require follow-up.  Also, a hypoechoic lymph node on the right side of the neck, 1.9 cm x 0.5 cm.  Neck CT (11/19/2017): Thyroid: Small thyroid nodules as seen at ultrasound. No worrisome finding by CT. Largest nodule on the right measures 1.7 cm. Lymph nodes: No enlarged or low-density nodes on either side of the neck. Symmetric normal appearing lymph nodes by size criteria.  She had another thyroid U/S 10/31/2020.  After this, she was advised that one of the thyroid nodules need to be biopsied.  I was able to review the report in care everywhere: ISTHMUS:  - Size: 0.3 cm.   RIGHT LOBE:  - Size: 4.6 x 1.9 x 1.6 cm.  - Echogenicity: Heterogeneous.   LEFT LOBE:  - Size: 2.7 x 1.7 x 1.6 cm.  - Echogenicity: Heterogeneous.   NODULES:   Nodule 1:  -- Size: 1.8 x 1 x 1.3 cm (long x AP x trans).  -- Location: Right lower.  -- Composition: solid or almost completely solid (2)  -- Echogenicity: very hypoechoic (3)  -- Shape: wider-than-tall (0)  -- Margins: smooth (0)  -- Echogenic foci: punctate echogenic foci (3)  -- ACR TI-RADS total points and risk category: 8 Points - TR5.   Nodule 2:  -- Size: 0.9 x 0.7 x 1 cm (long x AP x trans).  -- Location: Right lower.  -- Composition: solid or almost completely solid (2)  -- Echogenicity: hypoechoic (2)  -- Shape: wider-than-tall (0)  -- Margins: smooth (0)  -- Echogenic foci: none (0)  -- ACR TI-RADS total points and risk category: 4 Points - TR4.   Nodule 3:  -- Size: 0.7 x 0.5 x 0.7 cm (long x AP x trans).  -- Location: Right lower.  -- Composition: cystic or almost completely cystic (0)  -- Echogenicity:  anechoic (0)  -- Shape: wider-than-tall (0)  -- Margins: smooth (0)  -- Echogenic foci: peripheral calcifications (2)  -- ACR TI-RADS total points and risk category: 2 Points - TR2.   Nodule 4:  -- Size: 0.9 x 0.7 x 0.7 cm (long x AP x trans).  -- Location: Left lower.  -- Composition: solid or almost completely solid (2)  -- Echogenicity: hypoechoic (2)  -- Shape: wider-than-tall (0)  -- Margins: smooth (0)  -- Echogenic foci: peripheral calcifications (2)  -- ACR TI-RADS total points and risk category: 6 Points - TR4.   MISCELLANEOUS:  There are other scattered less than 5 mm nodules or cysts throughout both lobes.  Pt denies: - feeling nodules in neck - hoarseness - dysphagia - choking - SOB with lying down  Review latest TFTs: 04/25/2021: TSH 1.66 01/14/2020: TSH 1.12 Lab Results  Component Value Date   TSH 1.69 12/16/2018   As mentioned above, she was diagnosed with thyroid eye disease. She does not have a history of Hashimoto's thyroiditis or Graves' disease.  Before the coronavirus pandemic, she was meeting with friends one afternoon a month to play board games.  On Amlodipine now.  ROS: + see HPI  I reviewed pt's medications, allergies, PMH, social hx, family hx, and changes were documented in the history of present illness. Otherwise, unchanged from my initial visit note.  Past Medical History:  Diagnosis Date   Allergy    Zyrtec daily.   Anemia    as a child only   Arthritis    DDD lumbar spine   Asthma    Albuterol seasonally.   Chronic kidney disease    III A- CFR 53   Diabetes mellitus without complication (HCC)    Glaucoma    Heart murmur    since birth   Hyperlipidemia    Hypertension    Neuromuscular disorder ([place])    R third nerve palsy   OSA (obstructive sleep apnea) 11/11/2006   no CPAP   Seizures (HCC)    Thyroid disease    Thyroid nodules s/p needle biopsy negative.    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  [place] work.      Tobacco: 1/2 pdd x 20 years.      Alcohol:  None      Drugs: none       Education: [employer].       Exercise: No.      Seatbelt: 100%      Guns: unloaded.      Sexually active: none; total partners = 5.  Last STD screen 2006.     Current Outpatient Medications on File Prior to Visit  Medication Sig Dispense Refill   amLODipine (NORVASC) 5 MG tablet Take 5 mg by mouth daily.     Ascorbic Acid (VITAMIN C) 100 MG tablet Take 100 mg by mouth daily.     aspirin 325 MG EC tablet Take 325 mg by mouth daily.     BD PEN NEEDLE NANO 2ND GEN 32G X 4 MM MISC USE 5 TIMES DAILY FOR INSULIN 200 each 3   Cholecalciferol (VITAMIN D3 PO) Take by mouth. 5000 IU daily     Continuous Blood Gluc Receiver (FREESTYLE LIBRE 2 READER) DEVI USE  DAILY TO MONITOR BLOOD GLUCOSE 6 each 3   Continuous Blood Gluc Sensor (FREESTYLE LIBRE 2 SENSOR) MISC 1 each by Does not apply route every 14 (fourteen) days. 6 each 3   Dulaglutide (TRULICITY) 3 MC/9.4BS SOPN INJECT 3 MG UNDER THE SKIN ONCE WEEKLY 2 mL 1   hydrochlorothiazide (HYDRODIURIL) 12.5 MG tablet Take 12.5 mg by mouth daily.     insulin aspart (NOVOLOG  FLEXPEN) 100 UNIT/ML FlexPen INJECT 20 TO 30 UNITS UNDER THE SKIN THREE TIMES A DAY 15 MIN before A MEAL 45 mL 3   insulin detemir (LEVEMIR FLEXTOUCH) 100 UNIT/ML FlexPen INJECT 40 UNITS UNDER THE SKIN EVERY MORNING AND INJECT 50 UNITS EVERY EVENING 30 mL 3   insulin detemir (LEVEMIR) 100 UNIT/ML injection Inject 40 units under the skin every morning and inject 50 units every evening 10 mL 2   insulin glargine (LANTUS SOLOSTAR) 100 UNIT/ML Solostar Pen Inject under skin 40 units in a.m. and 45-47 units at bedtime 30 mL 3   JARDIANCE 25 MG TABS tablet TAKE ONE TABLET BY MOUTH DAILY 90 tablet 3   levocetirizine (XYZAL) 5 MG tablet Take 5 mg by mouth every evening.     losartan (COZAAR) 100 MG tablet Take 100 mg by mouth daily.     metFORMIN (GLUCOPHAGE) 1000 MG tablet Take 1 tablet (1,000 mg total) by mouth 2 (two) times daily. 180 tablet 3   Multiple Vitamins-Minerals (MULTIVITAMIN WITH MINERALS) tablet Take 1 tablet by mouth daily. (Patient not taking: Reported on 08/06/2021)     ONETOUCH ULTRA test strip TEST 3-4 TIMES DAILY 300 strip 2   pregabalin (LYRICA) 25 MG capsule Take 1-2 capsules (25-50 mg total) by mouth at bedtime. 60 capsule 5   rosuvastatin (CRESTOR) 10 MG tablet TAKE ONE TABLET BY MOUTH DAILY 30 tablet 0   SYMBICORT 160-4.5 MCG/ACT inhaler Inhale into the lungs. (Patient not taking: No sig reported)     Syringe/Needle, Disp, (SYRINGE 3CC/27GX1-1/4") 27G X 1-1/4" 3 ML MISC Use as instructed to inject insulin 100 each 0   vitamin B-12 (CYANOCOBALAMIN) 500 MCG tablet Take 500 mcg by mouth daily.     No current facility-administered medications on file prior to visit.   No Known Allergies Family History  Problem Relation Age of Onset   Diabetes Mother    Hypertension Mother    Ovarian cancer Mother 55   Breast cancer Mother 87   Cancer Mother 73       ovarian cancer age 7; breast cancer age 35   Diabetes Father    Heart disease Father 58       CAD/CABG; AAA; CHF    Hyperlipidemia Father    Hypertension Father    Prostate cancer Father        dx. 82-83   Colon polyps Father        section of colon removed; unsure of #   Skin cancer Sister    Heart disease Sister 97       3-vessel CAD s/p 1 stenting; CABG   Diabetes Sister    Hyperlipidemia Sister    Hypertension Sister    Heart disease Brother        Died of CHF at age 33.   Hyperlipidemia Brother    Hypertension Brother    Congestive Heart Failure Brother    Esophageal cancer Brother    Cancer Brother 38       GI- esophageal; smoker   Heart disease Brother  MIs in 63s and CABG.   Diabetes Brother    Hypertension Brother    Skin cancer Maternal Aunt        multiple - unknown type; dx. 5s   Pancreatic cancer Maternal Uncle        dx. 70s   Stroke Maternal Grandmother    Hypertension Maternal Grandmother    Skin cancer Maternal Grandfather        multiple - unknown types; dx. 63s   Cancer Maternal Grandfather    Heart Problems Paternal Grandfather    Crohn's disease Neg Hx    Rectal cancer Neg Hx    Stomach cancer Neg Hx    Colon cancer Neg Hx    PE: BP 128/72 (BP Location: Left Arm, Patient Position: Sitting, Cuff Size: Normal)    Pulse (!) 104    Ht 5\' 3"  (1.6 m)    Wt 204 lb (92.5 kg)    SpO2 97%    BMI 36.14 kg/m  Wt Readings from Last 3 Encounters:  01/08/22 204 lb (92.5 kg)  09/04/21 202 lb (91.6 kg)  08/06/21 205 lb (93 kg)   Constitutional: overweight, in NAD Eyes: PERRLA, EOMI, no exophthalmos ENT: moist mucous membranes, no thyromegaly, no cervical lymphadenopathy Cardiovascular: Tachycardia, RR, No MRG Respiratory: CTA B Musculoskeletal: no deformities, strength intact in all 4 Skin: moist, warm, no rashes Neurological: no tremor with outstretched hands, DTR normal in all 4  ASSESSMENT: 1. DM2, insulin-dependent, now controlled, with complications - Peripheral neuropathy.  2. MNG  3. HL  PLAN:  1. Patient with longstanding, fairly well-controlled  type 2 diabetes, on a complex medication regimen including metformin, SGLT2 inhibitor, weekly GLP-1 receptor analog, and basal/bolus insulin regimen.  At last visit, HbA1c was 6.3%, but since then, she had another HbA1c obtained 11/09/2021 and this was 6.8%.  In the past, sugars were above target when she was missing metformin and Levemir.  We discussed about setting alarms on her phone to remind her to take them.  At last visit, sugars were excellent throughout the day, only slightly higher after meals, but within the target range.  However, they were dropping overnight usually to normal levels, in the 80s and 90s, but occasionally to upper 60s.  She did feel these.  Therefore, I advised her to reduce the dose of Levemir.  We did not change her regimen otherwise. CGM interpretation -At today's visit, we reviewed her CGM downloads: It appears that 80% of values are in target range (goal >70%), while 18% are higher than 180 (goal <25%), and 2% are lower than 70 (goal <4%).  The calculated average blood sugar is 144.  The projected HbA1c for the next 3 months (GMI) is 6.8%. -Reviewing the CGM trends, sugars appear to be fairly well controlled, slightly higher but also with more variability after lunch, but decreasing overnight.  She even has some blood sugars in the 50s in the middle of the night.  I advised her to decrease the dose of Levemir at bedtime, but to let me know if the sugars remain low at night, in which case we also need to back off NovoLog. -She had problems obtaining Levemir from her pharmacy.  I advised her to also check with her insurance whether other analog insulins are covered.  For now, I printed a prescription for Levemir vials for her to see if she can get it from another pharmacy. - I suggested to:  Patient Instructions  Please continue: - Metformin 1000 mg 2x  a day with meals. - Jardiance 25 mg daily before b'fast - Trulicity 3 mcg weekly - Novolog 20-30 units before  meals  Decrease: - Levemir 40 units in a.m. and 40 units at bedtime  Please return in 4 months.  - we checked her HbA1c: 6.8% (stable) - advised to check sugars at different times of the day - 4x a day, rotating check times - advised for yearly eye exams >> she is UTD - return to clinic in 4 months  2. MNG -Reviewed her thyroid ultrasound report from 2019: Stable right dominant nodule, which was previously biopsied with benign results. She had a neck CT afterwards which did not show any worrisome nodules in her neck. -She saw her PCP in 10/2020, and at that time the nodules were felt to be enlarged.  Another thyroid ultrasound was checked (I do not have the images, only the report) and this showed mostly subcentimeter nodules while the dominant right nodule was 1.8 cm in the largest dimension.  This nodule was biopsied with benign results in the past and, per review of records, this is actually decreased in size on the latest ultrasound.  Therefore, I did not suggest any biopsy at that time. -We will repeat a thyroid ultrasound now -She denies neck compression symptoms -TSH was normal in 04/2021  3. HL -Reviewed latest lipid panel from before last visit: All fractions at goal -He continues on Crestor 10 mg daily without side effects  Thyroid U/S (01/14/2022): Parenchymal Echotexture: Moderately heterogenous  Isthmus: 0.4 cm  Right lobe: 5.3 x 1.7 x 1.8 cm  Left lobe: 4.2 x 1.5 x 1.6 cm  _________________________________________________   Previously described nodule in the thyroid isthmus is less conspicuous on today's exam.   Nodule labeled 1 (previously 2) is a solid nodule in the right thyroid lobe that measures 2.2 x 1.1 x 0.9 cm previously 2.1 x 1.3 x 0.8 cm in June 2018. It was previously biopsied, and remains essentially similar in size and morphology.   Nodule labeled 2 (previously 3) is a small subcentimeter nodule more inferior right thyroid lobe that measures 0.8 x  0.6 x 0.5 cm, previously measuring between 0.5-0.7 cm. It was also previously biopsied, and remains essentially similar in size and morphology.   Nodule labeled 3 is a solid hypoechoic with punctate echogenic foci TR 5 nodule in the inferior left thyroid lobe that measures 0.8 x 0.8 x 0.7 cm, previously measuring up to 0.6 cm in June 2018.       IMPRESSION: 1. Multinodular thyroid gland. 2. Nodules labeled 1 and 2 in the right thyroid lobe has been previously biopsied and remain essentially unchanged since 2018. Correlate with biopsy results. 3. Nodule labeled 3 demonstrates slow growth with new microcalcifications that are more evident on today's exam when compared to 2018. Although this nodule could be benign given minimal change in size over 5 year span, biopsy is recommended due its change in appearance as described.   Would suggest biopsy of the 0.8 cm left thyroid nodule.  Philemon Kingdom, MD PhD [facility] Endocrinology

## 2022-01-14 ENCOUNTER — Other Ambulatory Visit: Payer: Self-pay

## 2022-01-14 ENCOUNTER — Ambulatory Visit
Admission: RE | Admit: 2022-01-14 | Discharge: 2022-01-14 | Disposition: A | Discharge status: Home / Self Care | Payer: BC Managed Care – PPO | Source: Ambulatory Visit | Attending: Internal Medicine | Admitting: Internal Medicine

## 2022-01-14 DIAGNOSIS — E042 Nontoxic multinodular goiter: Secondary | ICD-10-CM

## 2022-01-18 ENCOUNTER — Encounter: Payer: Self-pay | Admitting: Internal Medicine

## 2022-01-18 ENCOUNTER — Other Ambulatory Visit: Payer: Self-pay | Admitting: Internal Medicine

## 2022-01-18 DIAGNOSIS — E042 Nontoxic multinodular goiter: Secondary | ICD-10-CM

## 2022-01-31 ENCOUNTER — Ambulatory Visit
Admission: RE | Admit: 2022-01-31 | Discharge: 2022-01-31 | Disposition: A | Discharge status: Home / Self Care | Payer: BC Managed Care – PPO | Source: Ambulatory Visit | Attending: Internal Medicine | Admitting: Internal Medicine

## 2022-01-31 ENCOUNTER — Other Ambulatory Visit (HOSPITAL_COMMUNITY)
Admission: RE | Admit: 2022-01-31 | Discharge: 2022-01-31 | Disposition: A | Discharge status: Home / Self Care | Payer: BC Managed Care – PPO | Source: Ambulatory Visit | Attending: Internal Medicine | Admitting: Internal Medicine

## 2022-01-31 DIAGNOSIS — E042 Nontoxic multinodular goiter: Secondary | ICD-10-CM

## 2022-02-01 LAB — CYTOLOGY - NON PAP

## 2022-02-19 ENCOUNTER — Other Ambulatory Visit: Payer: Self-pay | Admitting: Internal Medicine

## 2022-02-19 DIAGNOSIS — E1142 Type 2 diabetes mellitus with diabetic polyneuropathy: Secondary | ICD-10-CM

## 2022-02-23 ENCOUNTER — Other Ambulatory Visit: Payer: Self-pay | Admitting: Internal Medicine

## 2022-04-01 ENCOUNTER — Other Ambulatory Visit: Payer: Self-pay | Admitting: Internal Medicine

## 2022-04-15 ENCOUNTER — Other Ambulatory Visit: Payer: Self-pay | Admitting: Internal Medicine

## 2022-04-15 DIAGNOSIS — E1142 Type 2 diabetes mellitus with diabetic polyneuropathy: Secondary | ICD-10-CM

## 2022-04-16 ENCOUNTER — Other Ambulatory Visit: Payer: Self-pay | Admitting: Internal Medicine

## 2022-04-27 ENCOUNTER — Other Ambulatory Visit: Payer: Self-pay | Admitting: Internal Medicine

## 2022-04-27 DIAGNOSIS — E1142 Type 2 diabetes mellitus with diabetic polyneuropathy: Secondary | ICD-10-CM

## 2022-05-16 ENCOUNTER — Other Ambulatory Visit: Payer: Self-pay | Admitting: Internal Medicine

## 2022-05-16 DIAGNOSIS — E1142 Type 2 diabetes mellitus with diabetic polyneuropathy: Secondary | ICD-10-CM

## 2022-05-22 ENCOUNTER — Encounter: Payer: Self-pay | Admitting: Internal Medicine

## 2022-05-22 ENCOUNTER — Ambulatory Visit: Payer: BC Managed Care – PPO | Admitting: Internal Medicine

## 2022-05-22 VITALS — BP 118/58 | HR 91 | Ht 63.0 in | Wt 197.6 lb

## 2022-05-22 DIAGNOSIS — E785 Hyperlipidemia, unspecified: Secondary | ICD-10-CM | POA: Diagnosis not present

## 2022-05-22 DIAGNOSIS — E1142 Type 2 diabetes mellitus with diabetic polyneuropathy: Secondary | ICD-10-CM | POA: Diagnosis not present

## 2022-05-22 DIAGNOSIS — E042 Nontoxic multinodular goiter: Secondary | ICD-10-CM | POA: Diagnosis not present

## 2022-05-22 LAB — POCT GLYCOSYLATED HEMOGLOBIN (HGB A1C): Hemoglobin A1C: 6.3 % — AB (ref 4.0–5.6)

## 2022-05-22 MED ORDER — LEVEMIR FLEXTOUCH 100 UNIT/ML ~~LOC~~ SOPN: PEN_INJECTOR | SUBCUTANEOUS | 3 refills | Status: DC

## 2022-05-22 NOTE — Patient Instructions (Signed)
Please continue: - Metformin 1000 mg 2x a day with meals. - Jardiance 25 mg daily before b'fast - Trulicity 3 mcg weekly - Novolog 20-30 units before meals - try to use a 1:2 insulin to carb ratio - Levemir 40 units in a.m. and 40 units at bedtime  Please return in 4 months.

## 2022-05-22 NOTE — Progress Notes (Signed)
Patient ID: Madison Cowan, female   DOB: [birth year], 61 y.o.   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 Last visit 4 months ago.  Interim hx: No increased urination, nausea, chest pain.  She continues to have some blurry vision. She retired from a high stress job in 05/2021.  She greatly enjoys retirement. She usually goes to bed late.  DM2: Reviewed HbA1c levels: Lab Results  Component Value Date   HGBA1C 6.8 (A) 01/08/2022   HGBA1C 7.0 (A) 12/28/2020   HGBA1C 6.8 (A) 04/20/2020   HGBA1C 7.3 (A) 12/15/2019   HGBA1C 6.4 (A) 08/09/2019   HGBA1C 6.7 (A) 12/16/2018   HGBA1C 7.2 (A) 08/12/2018   HGBA1C 6.6 (A) 04/10/2018   HGBA1C 6.8 10/28/2017   HGBA1C 6.2 07/29/2017   HGBA1C 6.4 04/23/2017   HGBA1C 9.3 12/12/2016   HGBA1C 11.3 09/10/2016   HGBA1C 13.3 05/07/2016   HGBA1C 7.9 (H) 12/19/2015   HGBA1C 8.0 09/08/2015   HGBA1C 7.8 05/31/2015   HGBA1C 8.1 (H) 01/18/2015   HGBA1C 9.3 10/05/2014   HGBA1C 8.2 05/06/2014  11/09/2021: HbA1c 6.8% 08/31/2021: HbA1c 6.3% 04/25/2021: HbA1c 7.2% 01/14/2020: HbA1c 6.7%  She is on: - Metformin 1000 mg 2x a day with meals. - Jardiance 25 mg daily before b'fast - Trulicity 1.5 >> 3 mg weekly - Levemir 50 units in a.m. And 70 units at bedtime >> 40 units in a.m. and 47-50 >> 45-47 >> 40 units at bedtime - Novolog >> Humalog 20-25 >> 17-20 >> 20-30 units before meals  Pt checks her sugars 4 times a day with her CGM:   Previously:   Previously:   Lowest sugar was 67 >> 50s >> 53; she has hypoglycemia awareness in the 90s. Highest sugar was 200 >> upper 200s >> 190.  Glucometer: One Touch ultra  Pt's meals are: - Breakfast: protein shakes (6g carbs); McDonads >> usually skips - Lunch: may skip; salads cheeseburger; school cafeteria food >> may skip -  Dinner: fast food; porc chops + collard greens + salad; hamburger; McNuggets - Snacks: sometimes; sweets, nuts, chips She does not have organized mealtimes.  -Mild CKD, last BUN/creatinine:  08/31/2021: 20/1.04, GFR 66 04/25/2021: 18/1.12, GFR 53, Glu 144 01/14/2020: CMP normal with the exception of a high glucose at 116.  BUN/creatinine 19/0.9, GFR 69 Lab Results  Component Value Date   BUN 25 12/16/2018   BUN 20 10/15/2017   CREATININE 1.09 (H) 12/16/2018   CREATININE 0.92 10/15/2017  On Losartan.  -+ HL. Last set of lipids: 08/31/2021: 114/97/41/54 04/25/2021: 137/136/43/67 01/14/2020: 126/183/34/55 Lab Results  Component Value Date   CHOL 98 12/16/2018   HDL 29.30 (L) 12/16/2018   LDLCALC 44 12/16/2018   TRIG 120.0 12/16/2018   CHOLHDL 3 12/16/2018  On Crestor 10.  - last eye exam was in 10/2021: papilledema >> sent to ED directly >> CT head, then MRI/MRV, spinal tap, temporal aa. Bx (was on 60 mg Prednisone x3 weeks).  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 No DR.  She is a glaucoma suspect.  - + Numbness, tingling, and also burning in her feet.  She continues on Lyrica, B complex, alpha lipoic acid.  She is up-to-date with foot exam (05/2021).  She also has HTN.  Thyroid nodules:  Reviewed her imaging reports and biopsies: Thyroid ultrasound (01/03/2016): Dominant nodule is a right mid  lobe hypoechoic nodule of 2.1 x 0.8 x 1.2 cm, and previously measured up to 2.3 cm. There is one left thyroid nodule measuring 6 mm and several other tiny scattered nodules.  She had a benign biopsy of the dominant nodule.  Thyroid ultrasound (04/29/2017): Previously biopsied right nodule is unchanged.  She has 2 additional subcentimeter thyroid nodules, that did not require follow-up.  Also, a hypoechoic lymph node on the right side of the neck, 1.9 cm x 0.5 cm.  Neck CT (11/19/2017): Thyroid: Small thyroid nodules as seen at ultrasound. No worrisome finding by CT. Largest  nodule on the right measures 1.7 cm. Lymph nodes: No enlarged or low-density nodes on either side of the neck. Symmetric normal appearing lymph nodes by size criteria.  She had another thyroid U/S 10/31/2020.  After this, she was advised that one of the thyroid nodules need to be biopsied.  I was able to review the report in care everywhere: ISTHMUS:  - Size: 0.3 cm.   RIGHT LOBE:  - Size: 4.6 x 1.9 x 1.6 cm.  - Echogenicity: Heterogeneous.   LEFT LOBE:  - Size: 2.7 x 1.7 x 1.6 cm.  - Echogenicity: Heterogeneous.   NODULES:   Nodule 1:  -- Size: 1.8 x 1 x 1.3 cm (long x AP x trans).  -- Location: Right lower.  -- Composition: solid or almost completely solid (2)  -- Echogenicity: very hypoechoic (3)  -- Shape: wider-than-tall (0)  -- Margins: smooth (0)  -- Echogenic foci: punctate echogenic foci (3)  -- ACR TI-RADS total points and risk category: 8 Points - TR5.   Nodule 2:  -- Size: 0.9 x 0.7 x 1 cm (long x AP x trans).  -- Location: Right lower.  -- Composition: solid or almost completely solid (2)  -- Echogenicity: hypoechoic (2)  -- Shape: wider-than-tall (0)  -- Margins: smooth (0)  -- Echogenic foci: none (0)  -- ACR TI-RADS total points and risk category: 4 Points - TR4.   Nodule 3:  -- Size: 0.7 x 0.5 x 0.7 cm (long x AP x trans).  -- Location: Right lower.  -- Composition: cystic or almost completely cystic (0)  -- Echogenicity: anechoic (0)  -- Shape: wider-than-tall (0)  -- Margins: smooth (0)  -- Echogenic foci: peripheral calcifications (2)  -- ACR TI-RADS total points and risk category: 2 Points - TR2.   Nodule 4:  -- Size: 0.9 x 0.7 x 0.7 cm (long x AP x trans).  -- Location: Left lower.  -- Composition: solid or almost completely solid (2)  -- Echogenicity: hypoechoic (2)  -- Shape: wider-than-tall (0)  -- Margins: smooth (0)  -- Echogenic foci: peripheral calcifications (2)  -- ACR TI-RADS total points and risk category: 6 Points - TR4.    MISCELLANEOUS:  There are other scattered less than 5 mm nodules or cysts throughout both lobes.  Thyroid U/S (01/14/2022): Parenchymal Echotexture: Moderately heterogenous  Isthmus: 0.4 cm  Right lobe: 5.3 x 1.7 x 1.8 cm  Left lobe: 4.2 x 1.5 x 1.6 cm  _________________________________________________   Previously described nodule in the thyroid isthmus is less conspicuous on today's exam.   Nodule labeled 1 (previously 2) is a solid nodule in the right thyroid lobe that measures 2.2 x 1.1 x 0.9 cm previously 2.1 x 1.3 x 0.8 cm in June 2018. It was previously biopsied, and remains essentially similar in size and morphology.   Nodule labeled 2 (previously 3) is a small subcentimeter nodule  more inferior right thyroid lobe that measures 0.8 x 0.6 x 0.5 cm, previously measuring between 0.5-0.7 cm. It was also previously biopsied, and remains essentially similar in size and morphology.   Nodule labeled 3 is a solid hypoechoic with punctate echogenic foci TR 5 nodule in the inferior left thyroid lobe that measures 0.8 x 0.8 x 0.7 cm, previously measuring up to 0.6 cm in June 2018.       IMPRESSION: 1. Multinodular thyroid gland. 2. Nodules labeled 1 and 2 in the right thyroid lobe has been previously biopsied and remain essentially unchanged since 2018. Correlate with biopsy results. 3. Nodule labeled 3 demonstrates slow growth with new microcalcifications that are more evident on today's exam when compared to 2018. Although this nodule could be benign given minimal change in size over 5 year span, biopsy is recommended due its change in appearance as described.   FNA (01/31/2022): Clinical History: Solid hypoechoic with punctate echogenic foci TR-5  nodule in the inferior left thyroid lobe that measure 0.8 x 0.8 x 0.7cm  previously measuring up to 0.6cm in June 2018  Specimen Submitted:  A. THYROID, LEFT INFERIOR, FINE NEEDLE ASPIRATION:   FINAL MICROSCOPIC DIAGNOSIS:   - Consistent with benign follicular nodule (Bethesda category II)   SPECIMEN ADEQUACY:  Satisfactory for evaluation   Pt denies: - feeling nodules in neck - hoarseness - dysphagia - choking  Review latest TFTs: 04/25/2021: TSH 1.66 01/14/2020: TSH 1.12 Lab Results  Component Value Date   TSH 1.69 12/16/2018   She has thyroid eye disease. She does not have a history of Hashimoto's thyroiditis or Graves' disease.  ROS: + see HPI  I reviewed pt's medications, allergies, PMH, social hx, family hx, and changes were documented in the history of present illness. Otherwise, unchanged from my initial visit note.  Past Medical History:  Diagnosis Date   Allergy    Zyrtec daily.   Anemia    as a child only   Arthritis    DDD lumbar spine   Asthma    Albuterol seasonally.   Chronic kidney disease    III A- CFR 53   Diabetes mellitus without complication (HCC)    Glaucoma    Heart murmur    since birth   Hyperlipidemia    Hypertension    Neuromuscular disorder ([place])    R third nerve palsy   OSA (obstructive sleep apnea) 11/11/2006   no CPAP   Seizures (HCC)    Thyroid disease    Thyroid nodules s/p needle biopsy negative.    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  Wanship work.      Tobacco: 1/2 pdd x 20 years.      Alcohol:  None      Drugs: none       Education: [employer].       Exercise: No.      Seatbelt: 100%      Guns: unloaded.      Sexually active: none; total partners = 5.  Last STD screen 2006.     Current Outpatient Medications on File Prior to Visit  Medication Sig Dispense Refill   amLODipine (NORVASC) 5 MG tablet Take 5 mg by mouth daily.     Ascorbic Acid (VITAMIN C) 100 MG tablet Take 100 mg by mouth daily.     aspirin 325 MG EC tablet Take 325 mg by mouth daily.     BD INSULIN SYRINGE U/F 31G X 5/16" 1 ML MISC USE AS DIRECTED TO INJECT INSULIN TWO TIMES DAILY 100 each 0   BD PEN NEEDLE NANO 2ND GEN 32G X 4 MM MISC USE FIVE TIMES A DAY FOR INSULIN 200 each 3   Cholecalciferol (VITAMIN D3 PO) Take by mouth. 5000 IU daily     Continuous Blood Gluc Receiver (FREESTYLE LIBRE 2 READER) DEVI USE DAILY TO MONITOR BLOOD GLUCOSE 6 each 3   Continuous Blood Gluc Sensor (FREESTYLE LIBRE 2 SENSOR) MISC APPLY 1 EVERY 14 DAYS 6 each 3   hydrochlorothiazide (HYDRODIURIL) 12.5 MG tablet Take 12.5 mg by mouth daily.     insulin aspart (NOVOLOG FLEXPEN) 100 UNIT/ML FlexPen INJECT 20 TO 30 UNITS UNDER THE SKIN THREE TIMES A DAY 15 MIN before A MEAL 45 mL 3   insulin detemir (LEVEMIR FLEXTOUCH) 100 UNIT/ML FlexPen INJECT 40 UNITS UNDER THE SKIN EVERY MORNING AND INJECT 50 UNITS EVERY EVENING 30 mL 3   insulin detemir (LEVEMIR) 100 UNIT/ML injection INJECT 40 UNITS INTO THE SKIN EVERY MORNING AND 40 UNITS EVERY EVENING 20 mL 0   JARDIANCE 25 MG TABS tablet TAKE ONE TABLET BY MOUTH DAILY 90 tablet 3   levocetirizine (XYZAL) 5 MG tablet Take 5 mg by mouth every evening.     losartan (COZAAR) 100 MG tablet Take 100 mg by mouth daily.     metFORMIN (GLUCOPHAGE) 1000 MG tablet Take 1 tablet (1,000 mg total) by mouth 2 (two) times daily. 180 tablet 3    Multiple Vitamins-Minerals (MULTIVITAMIN WITH MINERALS) tablet Take 1 tablet by mouth daily. (Patient not taking: Reported on 08/06/2021)     ONETOUCH ULTRA test strip TEST 3-4 TIMES DAILY 300 strip 2   pregabalin (LYRICA) 25 MG capsule Take 1-2 capsules (25-50 mg total) by mouth at bedtime. 60 capsule 5   rosuvastatin (CRESTOR) 10 MG tablet TAKE ONE TABLET BY MOUTH DAILY 30 tablet 0   SYMBICORT 160-4.5 MCG/ACT inhaler Inhale into the lungs. (Patient not taking: No sig reported)     Syringe/Needle, Disp, (SYRINGE 3CC/27GX1-1/4") 27G X 1-1/4" 3 ML MISC Use as instructed to inject insulin 409 each 0   TRULICITY 3 WJ/1.9JY SOPN INJECT 3 MG UNDER THE SKIN ONCE WEEKLY 2 mL 1   vitamin B-12 (CYANOCOBALAMIN) 500 MCG tablet Take 500 mcg by mouth daily.     No current facility-administered medications on file prior to visit.   Allergies  Allergen Reactions   Covid-19 Mrna Vaccine [employer]) [phone] Mrna Vacc (Moderna)] Other (See  Comments)   Family History  Problem Relation Age of Onset   Diabetes Mother    Hypertension Mother    Ovarian cancer Mother 74   Breast cancer Mother 18   Cancer Mother 18       ovarian cancer age 26; breast cancer age 85   Diabetes Father    Heart disease Father 66       CAD/CABG; AAA; CHF   Hyperlipidemia Father    Hypertension Father    Prostate cancer Father        dx. 82-83   Colon polyps Father        section of colon removed; unsure of #   Skin cancer Sister    Heart disease Sister 23       3-vessel CAD s/p 1 stenting; CABG   Diabetes Sister    Hyperlipidemia Sister    Hypertension Sister    Heart disease Brother        Died of CHF at age 45.   Hyperlipidemia Brother    Hypertension Brother    Congestive Heart Failure Brother    Esophageal cancer Brother    Cancer Brother 98       GI- esophageal; smoker   Heart disease Brother        MIs in 47s and CABG.   Diabetes Brother    Hypertension Brother    Skin cancer Maternal Aunt        multiple -  unknown type; dx. 29s   Pancreatic cancer Maternal Uncle        dx. 70s   Stroke Maternal Grandmother    Hypertension Maternal Grandmother    Skin cancer Maternal Grandfather        multiple - unknown types; dx. 39s   Cancer Maternal Grandfather    Heart Problems Paternal Grandfather    Crohn's disease Neg Hx    Rectal cancer Neg Hx    Stomach cancer Neg Hx    Colon cancer Neg Hx    PE: BP (!) 118/58 (BP Location: Left Arm, Patient Position: Sitting, Cuff Size: Normal)   Pulse 91   Ht '5\' 3"'$  (1.6 m)   Wt 197 lb 9.6 oz (89.6 kg)   SpO2 98%   BMI 35.00 kg/m  Wt Readings from Last 3 Encounters:  05/22/22 197 lb 9.6 oz (89.6 kg)  01/08/22 204 lb (92.5 kg)  09/04/21 202 lb (91.6 kg)   Constitutional: overweight, in NAD Eyes: PERRLA, EOMI, no exophthalmos ENT: moist mucous membranes, no thyromegaly, no cervical lymphadenopathy Cardiovascular: RRR, No MRG Respiratory: CTA B Musculoskeletal: no deformities Skin: moist, warm, no rashes Neurological: no tremor with outstretched hands Diabetic Foot Exam - Simple   Simple Foot Form Diabetic Foot exam was performed with the following findings: Yes 05/22/2022  9:56 AM  Visual Inspection No deformities, no ulcerations, no other skin breakdown bilaterally: Yes Sensation Testing Intact to touch and monofilament testing bilaterally: Yes Pulse Check Posterior Tibialis and Dorsalis pulse intact bilaterally: Yes Comments L hallux toenail indurated, ingrown    ASSESSMENT: 1. DM2, insulin-dependent, now controlled, with complications - Peripheral neuropathy.  2. MNG  3. HL  PLAN:  1. Patient with longstanding, fairly well-controlled type 2 diabetes, on a complex medication regimen with metformin, SGLT2 inhibitor, weekly GLP-1 receptor agonist and basal-bolus insulin regimen, with latest HbA1c being at goal, 6.8% at last visit.  At that time, she was having occasional lower blood sugars at night so we backed off Levemir dose at  bedtime.  I advised her that if the sugars remain low at night also decrease her NovoLog before dinner.  She was having problems obtaining Levemir from the pharmacy and I advised her to check with her insurance to see whether another long-acting analog insulins were covered.  I also printed a prescription for Levemir vials for her to see if she can get it from another pharmacy.  At this visit, she was able to continue to obtain the Levemir pens-refilled the prescription today at [facility]. CGM interpretation -At today's visit, we reviewed her CGM downloads: It appears that 97% of values are in target range (goal >70%), while 3% are higher than 180 (goal <25%), and 0% are lower than 70 (goal <4%).  The calculated average blood sugar is 126.  The projected HbA1c for the next 3 months (GMI) is 6.3%. -Reviewing the CGM trends, sugars appear to be mostly within the target range, with only occasional higher blood sugars in the late of the afternoon, after lunch and occasionally overnight.  Upon questioning, she is having tart cherry juice which contains 32 g of carbs.  She is trying to split the amount she drinks in 2.  Once a week she is having cheeseburger with fries with her friends.  We discussed that she needs a higher dose of insulin before this meal.  We also discussed about getting fast carbs in case she is bolusing insulin but does not eat everything that she plans to.  She is interested in having an insulin to carb ratio.  Based on her current NovoLog use, she is using approximately a 1: 2 ICR.  We will continue this.  No further changes are needed in her regimen. - I suggested to:  Patient Instructions  Please continue: - Metformin 1000 mg 2x a day with meals. - Jardiance 25 mg daily before b'fast - Trulicity 3 mcg weekly - Novolog 20-30 units before meals - try to use a 1:2 insulin to carb ratio - Levemir 40 units in a.m. and 40 units at bedtime  Please return in 4 months.  - we checked her  HbA1c: 6.3% (improved) - advised to check sugars at different times of the day - 4x a day, rotating check times - advised for yearly eye exams >> she is UTD - return to clinic in 4 months  2. MNG -Reviewed her thyroid ultrasound report from 2019: Stable right dominant nodule, which was previously biopsied with benign results. She had a neck CT afterwards which did not show any worrisome nodules in her neck.. She saw her PCP in 10/2020, and at that time the nodules were felt to be enlarged.  Another thyroid ultrasound was checked (I do not have the images, only the report) and this showed mostly subcentimeter nodules while the dominant right nodule was 1.8 cm in the largest dimension.  This nodule was biopsied with benign results in the past and, per review of records, this is actually decreased in size on the latest ultrasound.  Therefore, I did not suggest any biopsy at that time. -After last visit we checked another thyroid ultrasound on 01/14/2022.  At that time, 2 of the dominant nodules were stable (previously biopsied with benign results), but the left inferior nodule was slightly larger and had punctate echogenic foci. -We biopsied this nodule and the results were benign -No neck compression symptoms -We will continue to keep an eye on the nodules but no intervention is needed for now.  3. HL - Reviewed latest lipid panel  from 08/2021: All fractions at goal -She continues on Crestor 10 mg daily without side effects -she had labs with PCP since last visit.  We will try to get these results.  Philemon Kingdom, MD PhD [facility]

## 2022-05-29 ENCOUNTER — Other Ambulatory Visit: Payer: Self-pay | Admitting: Internal Medicine

## 2022-07-14 ENCOUNTER — Other Ambulatory Visit: Payer: Self-pay | Admitting: Internal Medicine

## 2022-07-14 DIAGNOSIS — E1142 Type 2 diabetes mellitus with diabetic polyneuropathy: Secondary | ICD-10-CM

## 2022-08-05 ENCOUNTER — Other Ambulatory Visit: Payer: Self-pay

## 2022-08-05 DIAGNOSIS — E1142 Type 2 diabetes mellitus with diabetic polyneuropathy: Secondary | ICD-10-CM

## 2022-08-05 MED ORDER — NOVOLOG FLEXPEN 100 UNIT/ML ~~LOC~~ SOPN: PEN_INJECTOR | SUBCUTANEOUS | 2 refills | Status: DC

## 2022-09-24 ENCOUNTER — Ambulatory Visit: Payer: BC Managed Care – PPO | Admitting: Internal Medicine

## 2022-09-24 ENCOUNTER — Encounter: Payer: Self-pay | Admitting: Internal Medicine

## 2022-09-24 VITALS — BP 108/60 | HR 87 | Ht 63.0 in | Wt 195.4 lb

## 2022-09-24 DIAGNOSIS — E042 Nontoxic multinodular goiter: Secondary | ICD-10-CM

## 2022-09-24 DIAGNOSIS — E785 Hyperlipidemia, unspecified: Secondary | ICD-10-CM

## 2022-09-24 DIAGNOSIS — E1142 Type 2 diabetes mellitus with diabetic polyneuropathy: Secondary | ICD-10-CM | POA: Diagnosis not present

## 2022-09-24 LAB — POCT GLYCOSYLATED HEMOGLOBIN (HGB A1C): Hemoglobin A1C: 5.6 % (ref 4.0–5.6)

## 2022-09-24 MED ORDER — NOVOLOG FLEXPEN 100 UNIT/ML ~~LOC~~ SOPN: PEN_INJECTOR | SUBCUTANEOUS | 2 refills | Status: DC

## 2022-09-24 MED ORDER — LEVEMIR FLEXTOUCH 100 UNIT/ML ~~LOC~~ SOPN: PEN_INJECTOR | SUBCUTANEOUS | 3 refills | Status: DC

## 2022-09-24 NOTE — Patient Instructions (Addendum)
Please continue: - Metformin 1000 mg 2x a day with meals. - Jardiance 25 mg daily before b'fast - Trulicity 3 mcg weekly  Please decrease: - Novolog 15-25 units before meals - 1:2 insulin to carb ratio - Levemir 30 units in a.m. and 30 units at bedtime  Please return in 4 months.

## 2022-09-24 NOTE — Progress Notes (Signed)
Patient ID: Madison Cowan, female   DOB: 09-Aug-1961, 61 y.o.   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 Last visit 4 months ago.  Interim hx: No increased urination, nausea, chest pain.  She continues to have some blurry vision. She usually goes to bed late.  DM2: Reviewed HbA1c levels: Lab Results  Component Value Date   HGBA1C 6.3 (A) 05/22/2022   HGBA1C 6.8 (A) 01/08/2022   HGBA1C 7.0 (A) 12/28/2020   HGBA1C 6.8 (A) 04/20/2020   HGBA1C 7.3 (A) 12/15/2019   HGBA1C 6.4 (A) 08/09/2019   HGBA1C 6.7 (A) 12/16/2018   HGBA1C 7.2 (A) 08/12/2018   HGBA1C 6.6 (A) 04/10/2018   HGBA1C 6.8 10/28/2017   HGBA1C 6.2 07/29/2017   HGBA1C 6.4 04/23/2017   HGBA1C 9.3 12/12/2016   HGBA1C 11.3 09/10/2016   HGBA1C 13.3 05/07/2016   HGBA1C 7.9 (H) 12/19/2015   HGBA1C 8.0 09/08/2015   HGBA1C 7.8 05/31/2015   HGBA1C 8.1 (H) 01/18/2015   HGBA1C 9.3 10/05/2014  11/09/2021: HbA1c 6.8% 08/31/2021: HbA1c 6.3% 04/25/2021: HbA1c 7.2% 01/14/2020: HbA1c 6.7%  She is on: - Metformin 1000 mg 2x a day with meals. - Jardiance 25 mg daily before b'fast - Trulicity 1.5 >> 3 mg weekly - Levemir 50 units in a.m. And 70 units at bedtime >> 40 units in a.m. and 47-50 >> 45-47 >> 40 units at bedtime - Novolog >> Humalog 20-25 >> 17-20 >> 20-30 units before meals (insulin to carb ratio 1:2) - usually 2 meals a day (occas. 1x a day).  Pt checks her sugars 4 times a day with her CGM (with receiver):  Previously:   Previously:   Lowest sugar was 50s >> 53 >> 59 x1, 69; she has hypoglycemia awareness in the 90s. Highest sugar was upper 200s >> 190 >> 200s  Glucometer: One Touch ultra  Pt's meals are: - Breakfast: protein shakes (6g carbs); McDonads >> usually skips - Lunch: may skip; salads cheeseburger; school cafeteria food >>  may skip - Dinner: fast food; porc chops + collard greens + salad; hamburger; McNuggets - Snacks: sometimes; sweets, nuts, chips She does not have organized mealtimes.  -Mild CKD, last BUN/creatinine:  08/23/2022: 26/1.29, GFR 47 08/31/2021: 20/1.04, GFR 66 04/25/2021: 18/1.12, GFR 53, Glu 144 01/14/2020: CMP normal with the exception of a high glucose at 116.  BUN/creatinine 19/0.9, GFR 69 Lab Results  Component Value Date   BUN 25 12/16/2018   BUN 20 10/15/2017   CREATININE 1.09 (H) 12/16/2018   CREATININE 0.92 10/15/2017  On Losartan.  -+ HL. Last set of lipids: 05/2022: labs per PCP: WNL reportedly 08/31/2021: 114/97/41/54 04/25/2021: 137/136/43/67 01/14/2020: 126/183/34/55 Lab Results  Component Value Date   CHOL 98 12/16/2018   HDL 29.30 (L) 12/16/2018   LDLCALC 44 12/16/2018   TRIG 120.0 12/16/2018   CHOLHDL 3 12/16/2018  On Crestor 10.  - last eye exam: 08/26/2022 + DR and Mac edema - eye exam in 10/2021: papilledema >> sent to ED directly >> CT head, then MRI/MRV, spinal tap, temporal aa. Bx (was on 60 mg Prednisone x3 weeks).  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 No DR.  She is a glaucoma suspect.  - + Numbness, tingling, and also burning in her feet.  She continues on Lyrica, B complex, alpha lipoic acid.  Last foot exam 05/2022.  She also has  HTN.  Thyroid nodules:  Reviewed her imaging reports and biopsies: Thyroid ultrasound (01/03/2016): Dominant nodule is a right mid lobe hypoechoic nodule of 2.1 x 0.8 x 1.2 cm, and previously measured up to 2.3 cm. There is one left thyroid nodule measuring 6 mm and several other tiny scattered nodules.  She had a benign biopsy of the dominant nodule.  Thyroid ultrasound (04/29/2017): Previously biopsied right nodule is unchanged.  She has 2 additional subcentimeter thyroid nodules, that did not require follow-up.  Also, a hypoechoic lymph node on the right side of the neck, 1.9 cm x 0.5 cm.  Neck CT  (11/19/2017): Thyroid: Small thyroid nodules as seen at ultrasound. No worrisome finding by CT. Largest nodule on the right measures 1.7 cm. Lymph nodes: No enlarged or low-density nodes on either side of the neck. Symmetric normal appearing lymph nodes by size criteria.  She had another thyroid U/S 10/31/2020.  After this, she was advised that one of the thyroid nodules need to be biopsied.  I was able to review the report in care everywhere: ISTHMUS:  - Size: 0.3 cm.   RIGHT LOBE:  - Size: 4.6 x 1.9 x 1.6 cm.  - Echogenicity: Heterogeneous.   LEFT LOBE:  - Size: 2.7 x 1.7 x 1.6 cm.  - Echogenicity: Heterogeneous.   NODULES:   Nodule 1:  -- Size: 1.8 x 1 x 1.3 cm (long x AP x trans).  -- Location: Right lower.  -- Composition: solid or almost completely solid (2)  -- Echogenicity: very hypoechoic (3)  -- Shape: wider-than-tall (0)  -- Margins: smooth (0)  -- Echogenic foci: punctate echogenic foci (3)  -- ACR TI-RADS total points and risk category: 8 Points - TR5.   Nodule 2:  -- Size: 0.9 x 0.7 x 1 cm (long x AP x trans).  -- Location: Right lower.  -- Composition: solid or almost completely solid (2)  -- Echogenicity: hypoechoic (2)  -- Shape: wider-than-tall (0)  -- Margins: smooth (0)  -- Echogenic foci: none (0)  -- ACR TI-RADS total points and risk category: 4 Points - TR4.   Nodule 3:  -- Size: 0.7 x 0.5 x 0.7 cm (long x AP x trans).  -- Location: Right lower.  -- Composition: cystic or almost completely cystic (0)  -- Echogenicity: anechoic (0)  -- Shape: wider-than-tall (0)  -- Margins: smooth (0)  -- Echogenic foci: peripheral calcifications (2)  -- ACR TI-RADS total points and risk category: 2 Points - TR2.   Nodule 4:  -- Size: 0.9 x 0.7 x 0.7 cm (long x AP x trans).  -- Location: Left lower.  -- Composition: solid or almost completely solid (2)  -- Echogenicity: hypoechoic (2)  -- Shape: wider-than-tall (0)  -- Margins: smooth (0)  -- Echogenic  foci: peripheral calcifications (2)  -- ACR TI-RADS total points and risk category: 6 Points - TR4.   MISCELLANEOUS:  There are other scattered less than 5 mm nodules or cysts throughout both lobes.  Thyroid U/S (01/14/2022): Parenchymal Echotexture: Moderately heterogenous  Isthmus: 0.4 cm  Right lobe: 5.3 x 1.7 x 1.8 cm  Left lobe: 4.2 x 1.5 x 1.6 cm  _________________________________________________   Previously described nodule in the thyroid isthmus is less conspicuous on today's exam.   Nodule labeled 1 (previously 2) is a solid nodule in the right thyroid lobe that measures 2.2 x 1.1 x 0.9 cm previously 2.1 x 1.3 x 0.8 cm in June 2018. It was previously biopsied,  and remains essentially similar in size and morphology.   Nodule labeled 2 (previously 3) is a small subcentimeter nodule more inferior right thyroid lobe that measures 0.8 x 0.6 x 0.5 cm, previously measuring between 0.5-0.7 cm. It was also previously biopsied, and remains essentially similar in size and morphology.   Nodule labeled 3 is a solid hypoechoic with punctate echogenic foci TR 5 nodule in the inferior left thyroid lobe that measures 0.8 x 0.8 x 0.7 cm, previously measuring up to 0.6 cm in June 2018.       IMPRESSION: 1. Multinodular thyroid gland. 2. Nodules labeled 1 and 2 in the right thyroid lobe has been previously biopsied and remain essentially unchanged since 2018. Correlate with biopsy results. 3. Nodule labeled 3 demonstrates slow growth with new microcalcifications that are more evident on today's exam when compared to 2018. Although this nodule could be benign given minimal change in size over 5 year span, biopsy is recommended due its change in appearance as described.   FNA (01/31/2022): Clinical History: Solid hypoechoic with punctate echogenic foci TR-5  nodule in the inferior left thyroid lobe that measure 0.8 x 0.8 x 0.7cm  previously measuring up to 0.6cm in June 2018   Specimen Submitted:  A. THYROID, LEFT INFERIOR, FINE NEEDLE ASPIRATION:   FINAL MICROSCOPIC DIAGNOSIS:  - Consistent with benign follicular nodule (Bethesda category II)   SPECIMEN ADEQUACY:  Satisfactory for evaluation   Pt denies: - feeling nodules in neck - hoarseness - dysphagia - choking  Review latest TFTs: 04/25/2021: TSH 1.66 01/14/2020: TSH 1.12 Lab Results  Component Value Date   TSH 1.69 12/16/2018   She has thyroid eye disease. She does not have a history of Hashimoto's thyroiditis or Graves' disease.  She retired from a high stress job in 05/2021.  She greatly enjoys retirement.  ROS: + see HPI  I reviewed pt's medications, allergies, PMH, social hx, family hx, and changes were documented in the history of present illness. Otherwise, unchanged from my initial visit note.  Past Medical History:  Diagnosis Date   Allergy    Zyrtec daily.   Anemia    as a child only   Arthritis    DDD lumbar spine   Asthma    Albuterol seasonally.   Chronic kidney disease    III A- CFR 53   Diabetes mellitus without complication (HCC)    Glaucoma    Heart murmur    since birth   Hyperlipidemia    Hypertension    Neuromuscular disorder ([place])    R third nerve palsy   OSA (obstructive sleep apnea) 11/11/2006   no CPAP   Seizures (HCC)    Thyroid disease    Thyroid nodules s/p needle biopsy negative.    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  [employer] work.      Tobacco: 1/2 pdd x 20 years.      Alcohol:  None      Drugs: none       Education: [employer].       Exercise: No.      Seatbelt: 100%      Guns: unloaded.      Sexually active: none; total partners = 5.  Last STD screen 2006.     Current Outpatient Medications on File Prior to Visit  Medication Sig Dispense Refill   acetaZOLAMIDE (DIAMOX) 250 MG tablet Take by mouth.     amLODipine (NORVASC) 5 MG tablet Take 5 mg by mouth daily.     aspirin 325 MG EC tablet Take 325 mg by mouth daily.     BD INSULIN SYRINGE U/F 31G X 5/16" 1 ML MISC USE AS DIRECTED TO INJECT INSULIN TWO TIMES DAILY 100 each 0   BD PEN NEEDLE NANO 2ND GEN 32G X 4 MM MISC USE FIVE TIMES A DAY FOR INSULIN 200 each 3   Cholecalciferol (VITAMIN D3 PO) Take by mouth. 5000 IU daily     Continuous Blood Gluc Receiver (FREESTYLE LIBRE 2 READER) DEVI USE DAILY TO MONITOR BLOOD GLUCOSE 6 each 3   Continuous Blood Gluc Sensor (FREESTYLE LIBRE 2 SENSOR) MISC APPLY 1 EVERY 14 DAYS 6 each 3   hydrochlorothiazide (HYDRODIURIL) 12.5 MG tablet Take 12.5 mg by mouth daily.     insulin aspart (NOVOLOG FLEXPEN) 100 UNIT/ML FlexPen INJECT 20 TO 30 UNITS UNDER THE SKIN THREE TIMES A DAY 15 MINUTES BEFORE A MEAL 15 mL 2   insulin detemir (LEVEMIR FLEXTOUCH) 100 UNIT/ML FlexPen INJECT 40 UNITS UNDER THE SKIN EVERY MORNING AND INJECT 50 UNITS EVERY EVENING 30 mL 3   JARDIANCE 25 MG TABS tablet TAKE ONE TABLET BY MOUTH DAILY 90 tablet 3   levocetirizine (XYZAL) 5 MG tablet Take 5 mg by mouth every evening.     losartan (COZAAR) 100 MG tablet Take 100 mg by mouth daily.     metFORMIN (GLUCOPHAGE) 1000 MG tablet Take 1 tablet (1,000 mg total)  by mouth 2 (two) times daily. 180 tablet 3   Multiple Vitamins-Minerals (MULTIVITAMIN WITH MINERALS) tablet Take 1 tablet by mouth daily.     ONETOUCH ULTRA test strip TEST 3-4 TIMES DAILY 300 strip 2   pregabalin (LYRICA) 25 MG capsule Take 1-2 capsules (25-50 mg total) by mouth at bedtime. 60 capsule 5   rosuvastatin (CRESTOR) 10 MG tablet TAKE ONE TABLET BY MOUTH DAILY 30 tablet 0   SYMBICORT 160-4.5 MCG/ACT inhaler Inhale into the lungs.     Syringe/Needle, Disp, (SYRINGE 3CC/27GX1-1/4") 27G X 1-1/4" 3 ML MISC Use as instructed to inject insulin 384 each 0   TRULICITY 3 YK/5.9DJ SOPN INJECT 3 MG UNDER THE SKIN ONCE WEEKLY 2 mL 1   vitamin B-12 (CYANOCOBALAMIN) 500 MCG tablet Take 500 mcg by mouth daily.     No current facility-administered medications on file prior to visit.   Allergies  Allergen Reactions   Covid-19 Mrna Vaccine [employer]) [phone] Mrna Vacc (Moderna)] Other (See Comments)   Family History  Problem Relation Age of Onset   Diabetes Mother    Hypertension Mother    Ovarian cancer Mother 14   Breast cancer Mother 73   Cancer Mother 71       ovarian cancer age 11; breast cancer age 43   Diabetes Father    Heart disease Father 65       CAD/CABG; AAA; CHF   Hyperlipidemia Father    Hypertension Father    Prostate cancer Father        dx. 82-83   Colon polyps Father        section of colon removed; unsure of #   Skin cancer Sister    Heart disease Sister 65       3-vessel CAD s/p 1 stenting; CABG   Diabetes Sister    Hyperlipidemia Sister    Hypertension Sister    Heart disease Brother        Died of CHF at age 71.   Hyperlipidemia Brother    Hypertension Brother    Congestive Heart Failure Brother    Esophageal cancer Brother    Cancer Brother 62       GI- esophageal; smoker   Heart disease Brother        MIs in 54s and CABG.   Diabetes Brother    Hypertension Brother    Skin cancer Maternal Aunt        multiple - unknown type; dx. 22s   Pancreatic  cancer Maternal Uncle        dx. 70s   Stroke Maternal Grandmother    Hypertension Maternal Grandmother    Skin cancer Maternal Grandfather        multiple - unknown types; dx. 61s   Cancer Maternal Grandfather    Heart Problems Paternal Grandfather    Crohn's disease Neg Hx    Rectal cancer Neg Hx    Stomach cancer Neg Hx    Colon cancer Neg Hx    PE: BP 108/60 (BP Location: Right Arm, Patient Position: Sitting, Cuff Size: Normal)   Pulse 87   Ht _0  (1.6 m)   Wt 195 lb 6.4 oz (88.6 kg)   SpO2 99%   BMI 34.61 kg/m  Wt Readings from Last 3 Encounters:  09/24/22 195 lb 6.4 oz (88.6 kg)  05/22/22 197 lb 9.6 oz (89.6 kg)  01/08/22 204 lb (92.5 kg)   Constitutional: overweight, in NAD Eyes:  EOMI, no exophthalmos ENT: no thyromegaly, no cervical lymphadenopathy Cardiovascular: RRR, No MRG Respiratory: CTA B Musculoskeletal: no deformities Skin: moist, warm, no rashes Neurological: no tremor with outstretched hands  ASSESSMENT: 1. DM2, insulin-dependent, now controlled, with complications - Peripheral neuropathy.  2. MNG  3. HL  PLAN:  1. Patient with longstanding, fairly well-controlled type 2 diabetes, on a complex medication regimen with metformin, SGLT2 inhibitor, weekly GLP-1 receptor agonist and basal/bolus insulin regimen with improved control.  Latest HbA1c was lower, at 6.3%.  At that time, sugars are fluctuating mostly within the target range with only occasional higher blood sugars in the late afternoon, after lunch, and occasional overnight.  Upon questioning, she was having tart cherry juice containing 32 g of carbs.  She was trying to split the amount into, but we discussed about trying to stop this.  Also, she was having cheeseburger with fries once a week and sugars were higher afterwards.  I advised her to try to use a 1: 2 insulin to carb ratio (per her questioning).  Otherwise, we  continued the same regimen. CGM interpretation -At today's visit, we  reviewed her CGM downloads: It appears that 94% of values are in target range (goal >70%), while 3% are higher than 180 (goal <25%), and 3% are lower than 70 (goal <4%).  The calculated average blood sugar is 118.  The projected HbA1c for the next 3 months ([facility]MI) is 6.1%. -Reviewing the CGM trends, sugars appear to be well controlled, fluctuating almost entirely in the target range, but she does have occasional lower blood sugars especially overnight and in the evening.  We will reduce her Levemir and NovoLog doses.  We will continue metformin, Jardiance, and Trulicity for now.  I did advise her to let me know if she has more lows, to reduce her insulin doses further. - I suggested to:  Patient Instructions  Please continue: - Metformin 1000 mg 2x a day with meals. - Jardiance 25 mg daily before b'fast - Trulicity 3 mcg weekly  Please decrease: - Novolog 15-25 units before meals - 1:2 insulin to carb ratio - Levemir 30 units in a.m. and 30 units at bedtime  Please return in 4 months.  - we checked her HbA1c: 5.7% (better) - advised to check sugars at different times of the day - 4x a day, rotating check times - advised for yearly eye exams >> she is UTD - return to clinic in 4 months  2. MNG -Reviewed her thyroid ultrasound report from 2019: Stable right dominant nodule, which was previously biopsied with benign results. She had a neck CT afterwards which did not show any worrisome nodules in her neck.. She saw her PCP in 10/2020, and at that time the nodules were felt to be enlarged.  Another thyroid ultrasound was checked (I do not have the images, only the report) and this showed mostly subcentimeter nodules while the dominant right nodule was 1.8 cm in the largest dimension.  This nodule was biopsied with benign results in the past and, per review of records, this is actually decreased in size on the latest ultrasound.  Therefore, I did not suggest any biopsy at that time. -we checked  another thyroid ultrasound on 01/14/2022.  2 of the dominant nodules were stable (these were previously biopsied with benign results), but the left inferior nodule was slightly larger and had punctate echogenic foci. We biopsy of the nodule and the results were benign -She denies neck compression symptoms -We will need to keep an eye on the nodules but no intervention is needed for now  3. HL - Reviewed latest lipid panel from 08/2021: All fractions at goal - Continues Crestor 10 mg daily without side effects. - She had lipids with PCP within the last year but I do not have the results  Philemon Kingdom, MD PhD [facility]

## 2022-10-08 ENCOUNTER — Other Ambulatory Visit: Payer: Self-pay | Admitting: Internal Medicine

## 2022-10-10 ENCOUNTER — Other Ambulatory Visit: Payer: Self-pay | Admitting: Internal Medicine

## 2022-10-15 ENCOUNTER — Other Ambulatory Visit: Payer: Self-pay | Admitting: Internal Medicine

## 2022-10-15 DIAGNOSIS — E1142 Type 2 diabetes mellitus with diabetic polyneuropathy: Secondary | ICD-10-CM

## 2022-10-16 ENCOUNTER — Other Ambulatory Visit: Payer: Self-pay

## 2022-10-16 DIAGNOSIS — E1142 Type 2 diabetes mellitus with diabetic polyneuropathy: Secondary | ICD-10-CM

## 2022-10-16 MED ORDER — TRULICITY 3 MG/0.5ML ~~LOC~~ SOAJ: SUBCUTANEOUS | 1 refills | Status: DC

## 2022-10-27 ENCOUNTER — Other Ambulatory Visit: Payer: Self-pay | Admitting: Internal Medicine

## 2022-10-27 DIAGNOSIS — E1142 Type 2 diabetes mellitus with diabetic polyneuropathy: Secondary | ICD-10-CM

## 2022-11-08 ENCOUNTER — Other Ambulatory Visit: Payer: Self-pay | Admitting: Internal Medicine

## 2022-11-08 ENCOUNTER — Telehealth: Payer: Self-pay

## 2022-11-08 DIAGNOSIS — E1142 Type 2 diabetes mellitus with diabetic polyneuropathy: Secondary | ICD-10-CM

## 2022-11-08 MED ORDER — LANTUS SOLOSTAR 100 UNIT/ML ~~LOC~~ SOPN: 30.0000 [IU] | PEN_INJECTOR | Freq: Two times a day (BID) | SUBCUTANEOUS | 3 refills | Status: DC

## 2022-11-08 MED ORDER — NOVOLOG FLEXPEN 100 UNIT/ML ~~LOC~~ SOPN: 20.0000 [IU] | PEN_INJECTOR | Freq: Three times a day (TID) | SUBCUTANEOUS | 5 refills | Status: DC

## 2022-11-08 NOTE — Telephone Encounter (Signed)
OK, I sent it

## 2022-11-08 NOTE — Telephone Encounter (Signed)
Pt called to advise her insurance will not cover Levemir after 11/11/2022. Requesting switch to Lantus.

## 2022-11-20 ENCOUNTER — Other Ambulatory Visit: Payer: Self-pay | Admitting: Internal Medicine

## 2022-11-20 DIAGNOSIS — E1142 Type 2 diabetes mellitus with diabetic polyneuropathy: Secondary | ICD-10-CM

## 2022-12-04 ENCOUNTER — Other Ambulatory Visit (HOSPITAL_COMMUNITY): Payer: Self-pay

## 2022-12-16 ENCOUNTER — Other Ambulatory Visit: Payer: Self-pay | Admitting: Internal Medicine

## 2022-12-16 DIAGNOSIS — E1142 Type 2 diabetes mellitus with diabetic polyneuropathy: Secondary | ICD-10-CM

## 2023-01-19 ENCOUNTER — Other Ambulatory Visit: Payer: Self-pay

## 2023-01-19 MED ORDER — BD PEN NEEDLE NANO 2ND GEN 32G X 4 MM MISC
3 refills | Status: DC
Start: 2023-01-19 — End: 2023-08-26

## 2023-01-30 ENCOUNTER — Ambulatory Visit: Payer: BC Managed Care – PPO | Admitting: Internal Medicine

## 2023-01-30 ENCOUNTER — Encounter: Payer: Self-pay | Admitting: Internal Medicine

## 2023-01-30 VITALS — BP 100/62 | HR 68 | Ht 63.0 in | Wt 194.2 lb

## 2023-01-30 DIAGNOSIS — E042 Nontoxic multinodular goiter: Secondary | ICD-10-CM

## 2023-01-30 DIAGNOSIS — E1142 Type 2 diabetes mellitus with diabetic polyneuropathy: Secondary | ICD-10-CM

## 2023-01-30 DIAGNOSIS — E785 Hyperlipidemia, unspecified: Secondary | ICD-10-CM

## 2023-01-30 NOTE — Progress Notes (Signed)
Patient ID: Madison Cowan, female   DOB: 1961/06/07, 62 y.o.   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 Last visit 4 months ago.  Interim hx: No increased urination, nausea, chest pain.  She continues to have some blurry vision. She usually goes to bed late.  DM2: Reviewed HbA1c levels: 01/02/2023: HbA1c 6.0% Lab Results  Component Value Date   HGBA1C 5.6 09/24/2022   HGBA1C 6.3 (A) 05/22/2022   HGBA1C 6.8 (A) 01/08/2022   HGBA1C 7.0 (A) 12/28/2020   HGBA1C 6.8 (A) 04/20/2020   HGBA1C 7.3 (A) 12/15/2019   HGBA1C 6.4 (A) 08/09/2019   HGBA1C 6.7 (A) 12/16/2018   HGBA1C 7.2 (A) 08/12/2018   HGBA1C 6.6 (A) 04/10/2018   HGBA1C 6.8 10/28/2017   HGBA1C 6.2 07/29/2017   HGBA1C 6.4 04/23/2017   HGBA1C 9.3 12/12/2016   HGBA1C 11.3 09/10/2016   HGBA1C 13.3 05/07/2016   HGBA1C 7.9 (H) 12/19/2015   HGBA1C 8.0 09/08/2015   HGBA1C 7.8 05/31/2015   HGBA1C 8.1 (H) 01/18/2015  11/09/2021: HbA1c 6.8% 08/31/2021: HbA1c 6.3% 04/25/2021: HbA1c 7.2% 01/14/2020: HbA1c 6.7%  She is on: - Metformin 1000 mg 2x a day with meals. - Jardiance 25 mg daily before b'fast - Trulicity 1.5 >> 3 mg weekly - Levemir 50 units in a.m. And 70 units at bedtime >> ... Lantus 30 units twice a day - Novolog >> Humalog 20-25 >> 17-20 >> 20-30 units before meals (insulin to carb ratio 1:2) - usually 2 meals a day (occas. 1x a day) >> 15 to 25 units before meals (1: 2 insulin to carb ratio)  Pt checks her sugars 4 times a day with her CGM (with receiver):  Previously:  Previously:  Lowest sugar was 50s >> 53 >> 59 x1, 69 >> 50; she has hypoglycemia awareness in the 90s. Highest sugar was upper 200s >> 190 >> 200s >> 200  Glucometer: One Touch ultra  Pt's meals are: - Breakfast: protein shakes (6g carbs); McDonads >> usually skips -  Lunch: may skip; salads cheeseburger; school cafeteria food >> may skip - Dinner: fast food; porc chops + collard greens + salad; hamburger; McNuggets - Snacks: sometimes; sweets, nuts, chips She does not have organized mealtimes.  -Mild CKD, last BUN/creatinine:  01/02/2023: 24/1.1, GFR 56.2, glucose 99 (will scan results) 08/23/2022: 26/1.29, GFR 47 08/31/2021: 20/1.04, GFR 66 04/25/2021: 18/1.12, GFR 53, Glu 144 01/14/2020: CMP normal with the exception of a high glucose at 116.  BUN/creatinine 19/0.9, GFR 69 Lab Results  Component Value Date   BUN 25 12/16/2018   BUN 20 10/15/2017   CREATININE 1.09 (H) 12/16/2018   CREATININE 0.92 10/15/2017  On Losartan.  -+ HL. Last set of lipids: 01/02/2023: 123/82/48/59 05/2022: labs per PCP: WNL reportedly 08/31/2021: 114/97/41/54 04/25/2021: 137/136/43/67 01/14/2020: 126/183/34/55 Lab Results  Component Value Date   CHOL 98 12/16/2018   HDL 29.30 (L) 12/16/2018   LDLCALC 44 12/16/2018   TRIG 120.0 12/16/2018   CHOLHDL 3 12/16/2018  On Crestor 10.  - last eye exam: 08/26/2022 + DR and Mac edema; eye exam in 10/2021: papilledema >> sent to ED directly >> CT head, then MRI/MRV, spinal tap, temporal aa. Bx (was on 60 mg Prednisone x3 weeks).  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 No DR.  She is a glaucoma suspect.  - + Numbness, tingling, and also burning  in her feet.  She continues on Lyrica, B complex, alpha lipoic acid.  Last foot exam 05/2022.  She also has HTN.  In the past, she lost more than 100 pounds.  Thyroid nodules:  Reviewed her imaging reports and biopsies: Thyroid ultrasound (01/03/2016): Dominant nodule is a right mid lobe hypoechoic nodule of 2.1 x 0.8 x 1.2 cm, and previously measured up to 2.3 cm. There is one left thyroid nodule measuring 6 mm and several other tiny scattered nodules.  She had a benign biopsy of the dominant nodule.  Thyroid ultrasound (04/29/2017): Previously biopsied right nodule is  unchanged.  She has 2 additional subcentimeter thyroid nodules, that did not require follow-up.  Also, a hypoechoic lymph node on the right side of the neck, 1.9 cm x 0.5 cm.  Neck CT (11/19/2017): Thyroid: Small thyroid nodules as seen at ultrasound. No worrisome finding by CT. Largest nodule on the right measures 1.7 cm. Lymph nodes: No enlarged or low-density nodes on either side of the neck. Symmetric normal appearing lymph nodes by size criteria.  She had another thyroid U/S 10/31/2020.  After this, she was advised that one of the thyroid nodules need to be biopsied.  I was able to review the report in care everywhere: ISTHMUS:  - Size: 0.3 cm.   RIGHT LOBE:  - Size: 4.6 x 1.9 x 1.6 cm.  - Echogenicity: Heterogeneous.   LEFT LOBE:  - Size: 2.7 x 1.7 x 1.6 cm.  - Echogenicity: Heterogeneous.   NODULES:   Nodule 1:  -- Size: 1.8 x 1 x 1.3 cm (long x AP x trans).  -- Location: Right lower.  -- Composition: solid or almost completely solid (2)  -- Echogenicity: very hypoechoic (3)  -- Shape: wider-than-tall (0)  -- Margins: smooth (0)  -- Echogenic foci: punctate echogenic foci (3)  -- ACR TI-RADS total points and risk category: 8 Points - TR5.   Nodule 2:  -- Size: 0.9 x 0.7 x 1 cm (long x AP x trans).  -- Location: Right lower.  -- Composition: solid or almost completely solid (2)  -- Echogenicity: hypoechoic (2)  -- Shape: wider-than-tall (0)  -- Margins: smooth (0)  -- Echogenic foci: none (0)  -- ACR TI-RADS total points and risk category: 4 Points - TR4.   Nodule 3:  -- Size: 0.7 x 0.5 x 0.7 cm (long x AP x trans).  -- Location: Right lower.  -- Composition: cystic or almost completely cystic (0)  -- Echogenicity: anechoic (0)  -- Shape: wider-than-tall (0)  -- Margins: smooth (0)  -- Echogenic foci: peripheral calcifications (2)  -- ACR TI-RADS total points and risk category: 2 Points - TR2.   Nodule 4:  -- Size: 0.9 x 0.7 x 0.7 cm (long x AP x trans).  --  Location: Left lower.  -- Composition: solid or almost completely solid (2)  -- Echogenicity: hypoechoic (2)  -- Shape: wider-than-tall (0)  -- Margins: smooth (0)  -- Echogenic foci: peripheral calcifications (2)  -- ACR TI-RADS total points and risk category: 6 Points - TR4.   MISCELLANEOUS:  There are other scattered less than 5 mm nodules or cysts throughout both lobes.  Thyroid U/S (01/14/2022): Parenchymal Echotexture: Moderately heterogenous  Isthmus: 0.4 cm  Right lobe: 5.3 x 1.7 x 1.8 cm  Left lobe: 4.2 x 1.5 x 1.6 cm  _________________________________________________   Previously described nodule in the thyroid isthmus is less conspicuous on today's exam.   Nodule labeled 1 (previously  2) is a solid nodule in the right thyroid lobe that measures 2.2 x 1.1 x 0.9 cm previously 2.1 x 1.3 x 0.8 cm in June 2018. It was previously biopsied, and remains essentially similar in size and morphology.   Nodule labeled 2 (previously 3) is a small subcentimeter nodule more inferior right thyroid lobe that measures 0.8 x 0.6 x 0.5 cm, previously measuring between 0.5-0.7 cm. It was also previously biopsied, and remains essentially similar in size and morphology.   Nodule labeled 3 is a solid hypoechoic with punctate echogenic foci TR 5 nodule in the inferior left thyroid lobe that measures 0.8 x 0.8 x 0.7 cm, previously measuring up to 0.6 cm in June 2018.       IMPRESSION: 1. Multinodular thyroid gland. 2. Nodules labeled 1 and 2 in the right thyroid lobe has been previously biopsied and remain essentially unchanged since 2018. Correlate with biopsy results. 3. Nodule labeled 3 demonstrates slow growth with new microcalcifications that are more evident on today's exam when compared to 2018. Although this nodule could be benign given minimal change in size over 5 year span, biopsy is recommended due its change in appearance as described.   FNA (01/31/2022): Clinical  History: Solid hypoechoic with punctate echogenic foci TR-5  nodule in the inferior left thyroid lobe that measure 0.8 x 0.8 x 0.7cm  previously measuring up to 0.6cm in June 2018  Specimen Submitted:  A. THYROID, LEFT INFERIOR, FINE NEEDLE ASPIRATION:   FINAL MICROSCOPIC DIAGNOSIS:  - Consistent with benign follicular nodule (Bethesda category II)   SPECIMEN ADEQUACY:  Satisfactory for evaluation   Pt denies: - feeling nodules in neck - hoarseness - dysphagia - choking  Review latest TFTs: 01/02/2023:  TSH 1.80 04/25/2021: TSH 1.66 01/14/2020: TSH 1.12 Lab Results  Component Value Date   TSH 1.69 12/16/2018   She has thyroid eye disease. She does not have a history of Hashimoto's thyroiditis or Graves' disease.  She retired from a high stress job in 05/2021.  She greatly enjoys retirement.  ROS: + see HPI  I reviewed pt's medications, allergies, PMH, social hx, family hx, and changes were documented in the history of present illness. Otherwise, unchanged from my initial visit note.  Past Medical History:  Diagnosis Date   Allergy    Zyrtec daily.   Anemia    as a child only   Arthritis    DDD lumbar spine   Asthma    Albuterol seasonally.   Chronic kidney disease    III A- CFR 53   Diabetes mellitus without complication (HCC)    Glaucoma    Heart murmur    since birth   Hyperlipidemia    Hypertension    Neuromuscular disorder ([place])    R third nerve palsy   OSA (obstructive sleep apnea) 11/11/2006   no CPAP   Seizures (HCC)    Thyroid disease    Thyroid nodules s/p needle biopsy negative.    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  [place] work.      Tobacco: 1/2 pdd x 20 years.      Alcohol:  None      Drugs: none       Education: [employer].       Exercise: No.      Seatbelt: 100%      Guns: unloaded.      Sexually active: none; total partners = 5.  Last STD screen 2006.     Current Outpatient Medications on File Prior to Visit  Medication Sig Dispense Refill   acetaZOLAMIDE (DIAMOX) 250 MG tablet Take by mouth.     amLODipine (NORVASC) 5 MG tablet Take 5 mg by mouth daily.     aspirin 325 MG EC tablet Take 325 mg by mouth daily.     BD INSULIN SYRINGE U/F 31G X 5/16" 1 ML MISC USE AS DIRECTED TO INJECT INSULIN TWO TIMES DAILY 100 each 0   Cholecalciferol (VITAMIN D3 PO) Take by mouth. 5000 IU daily     Continuous Blood Gluc Receiver (FREESTYLE LIBRE 2 READER) DEVI USE DAILY TO MONITOR BLOOD GLUCOSE 6 each 3   Continuous Blood Gluc Sensor (FREESTYLE LIBRE 2 SENSOR) MISC APPLY 1 EVERY 14 DAYS 6 each 3   hydrochlorothiazide (HYDRODIURIL) 12.5 MG tablet Take 12.5 mg by mouth daily.     insulin aspart (NOVOLOG FLEXPEN) 100 UNIT/ML FlexPen Inject 20-30 Units into the skin 3 (three) times daily with meals. Inject 20 to 30 units under the skin 3 times a day 15 minutes before a meal 30 mL 5   insulin glargine (LANTUS SOLOSTAR) 100 UNIT/ML Solostar Pen Inject 30 Units into the skin 2 (two) times daily. 45 mL 3   Insulin Pen Needle (BD PEN NEEDLE NANO 2ND GEN) 32G X 4 MM MISC USE FIVE TIMES A DAY FOR INSULIN 200 each 3   JARDIANCE 25 MG TABS  tablet TAKE ONE TABLET BY MOUTH DAILY 90 tablet 3   levocetirizine (XYZAL) 5 MG tablet Take 5 mg by mouth every evening.     losartan (COZAAR) 100 MG tablet Take 100 mg by mouth daily.     metFORMIN (GLUCOPHAGE) 1000 MG tablet Take 1 tablet (1,000 mg total) by mouth 2 (two) times daily. 180 tablet 3   Multiple Vitamins-Minerals (MULTIVITAMIN WITH MINERALS) tablet Take 1 tablet by mouth daily.     ONETOUCH ULTRA test strip TEST 3-4 TIMES DAILY 300 strip 2   pregabalin (LYRICA) 25 MG capsule Take 1-2 capsules (25-50 mg total) by mouth at bedtime. 60 capsule 5   rosuvastatin (CRESTOR) 10 MG tablet TAKE ONE TABLET BY MOUTH DAILY 30 tablet 0   SYMBICORT 160-4.5 MCG/ACT inhaler Inhale into the lungs.     Syringe/Needle, Disp, (SYRINGE 3CC/27GX1-1/4") 27G X 1-1/4" 3 ML MISC Use as instructed to inject insulin [id number] each 0   TRULICITY 3 0000000 SOPN INJECT 3 MG UNDER THE SKIN ONCE WEEKLY 2 mL 1  vitamin B-12 (CYANOCOBALAMIN) 500 MCG tablet Take 500 mcg by mouth daily.     No current facility-administered medications on file prior to visit.   Allergies  Allergen Reactions   Covid-19 Mrna Vaccine Therapist, music) [Covid-19 Mrna Vacc (Moderna)] Other (See Comments)   Family History  Problem Relation Age of Onset   Diabetes Mother    Hypertension Mother    Ovarian cancer Mother 47   Breast cancer Mother 72   Cancer Mother 41       ovarian cancer age 62; breast cancer age 107   Diabetes Father    Heart disease Father 47       CAD/CABG; AAA; CHF   Hyperlipidemia Father    Hypertension Father    Prostate cancer Father        dx. 82-83   Colon polyps Father        section of colon removed; unsure of #   Skin cancer Sister    Heart disease Sister 50       3-vessel CAD s/p 1 stenting; CABG   Diabetes Sister    Hyperlipidemia Sister    Hypertension Sister    Heart disease Brother        Died of CHF at age 39.   Hyperlipidemia Brother    Hypertension Brother    Congestive Heart Failure Brother     Esophageal cancer Brother    Cancer Brother 34       GI- esophageal; smoker   Heart disease Brother        MIs in 60s and CABG.   Diabetes Brother    Hypertension Brother    Skin cancer Maternal Aunt        multiple - unknown type; dx. 37s   Pancreatic cancer Maternal Uncle        dx. 70s   Stroke Maternal Grandmother    Hypertension Maternal Grandmother    Skin cancer Maternal Grandfather        multiple - unknown types; dx. 41s   Cancer Maternal Grandfather    Heart Problems Paternal Grandfather    Crohn's disease Neg Hx    Rectal cancer Neg Hx    Stomach cancer Neg Hx    Colon cancer Neg Hx    PE: BP 100/62 (BP Location: Right Arm, Patient Position: Sitting, Cuff Size: Normal)   Pulse 68   Ht 5\' 3"  (1.6 m)   Wt 194 lb 3.2 oz (88.1 kg)   SpO2 98%   BMI 34.40 kg/m  Wt Readings from Last 3 Encounters:  01/30/23 194 lb 3.2 oz (88.1 kg)  09/24/22 195 lb 6.4 oz (88.6 kg)  05/22/22 197 lb 9.6 oz (89.6 kg)   Constitutional: overweight, in NAD Eyes:  EOMI, no exophthalmos ENT: no thyromegaly, no cervical lymphadenopathy Cardiovascular: RRR, No MRG Respiratory: CTA B Musculoskeletal: no deformities Skin: no rashes Neurological: no tremor with outstretched hands  ASSESSMENT: 1. DM2, insulin-dependent, now controlled, with complications - Peripheral neuropathy.  2. MNG  3. HL  PLAN:  1. Patient with longstanding, well-controlled type 2 diabetes, on a complex medication regimen with metformin, SGLT2 inhibitor and basal/bolus insulin regimen along with weekly GLP-1 receptor agonist, with improved control at last visit-HbA1c 5.6%.  At that time, we reduced her insulin doses as sugars appears to be well-controlled, fluctuating almost entirely within the target range.  She had occasional lower blood sugars especially overnight and in the evening. CGM interpretation -At today's visit, we reviewed her CGM downloads: It appears that  95% of values are in target range (goal >70%),  while 2% are higher than 180 (goal <25%), and 3% are lower than 70 (goal <4%).  The calculated average blood sugar is 119.  The projected HbA1c for the next 3 months (GMI) is 6.2%. -Reviewing the CGM trends, sugars appear to be increasing overnight, higher than goal in the morning and then improving later in the day but dropping again after dinner.  She is using a 1-2 insulin to carb ratio so I advised her to relax this to 1-3 with dinner.  Since the sugars are increasing gradually throughout the night we discussed that she may need to increase the Lantus dose at night.  Since she does have occasional low blood sugars during the day I advised her to reduce her morning Lantus dose. - I suggested to:  Patient Instructions  Please continue: - Metformin 1000 mg 2x a day with meals. - Jardiance 25 mg daily before b'fast - Trulicity 3 mcg weekly  Please change: - Novolog 15-25 units before meals - 1:2 insulin to carb ratio for the day meals, but 1:3 for dinner - Lantus 25 units in a.m. and 30-35 units at bedtime  Please return in 4 months.  - advised to check sugars at different times of the day - 4x a day, rotating check times - advised for yearly eye exams >> she is UTD - return to clinic in 3-4 months  2. MNG -Reviewed her thyroid ultrasound report from 2019: Stable right dominant nodule, which was previously biopsied with benign results. She had a neck CT afterwards which did not show any worrisome nodules in her neck.. She saw her PCP in 10/2020, and at that time the nodules were felt to be enlarged.  Another thyroid ultrasound was checked (I do not have the images, only the report) and this showed mostly subcentimeter nodules while the dominant right nodule was 1.8 cm in the largest dimension.  This nodule was biopsied with benign results in the past and, per review of records, this is actually decreased in size on the latest ultrasound.  Therefore, I did not suggest any biopsy at that  time. -Reviewed the thyroid ultrasound results from 01/2022.  2 of the dominant nodules were stable (these were previously biopsied with benign results), but the left inferior nodule was slightly larger and had punctate echogenic foci.  We biopsied the nodule and the results were benign -No neck compression symptoms -Will need to keep an eye on the nodules but no intervention is needed for now  3. HL - Reviewed latest lipid panel from 12/2022: All fractions at goal - Continues Crestor 10 mg daily without side effects.  Philemon Kingdom, MD PhD [facility] Endocrinology

## 2023-01-30 NOTE — Patient Instructions (Addendum)
Please continue: - Metformin 1000 mg 2x a day with meals. - Jardiance 25 mg daily before b'fast - Trulicity 3 mcg weekly  Please change: - Novolog 15-25 units before meals - 1:2 insulin to carb ratio for the day meals, but 1:3 for dinner - Lantus 25 units in a.m. and 30-35 units at bedtime  Please return in 4 months.

## 2023-02-12 ENCOUNTER — Other Ambulatory Visit: Payer: Self-pay | Admitting: Internal Medicine

## 2023-02-12 DIAGNOSIS — E1142 Type 2 diabetes mellitus with diabetic polyneuropathy: Secondary | ICD-10-CM

## 2023-03-21 ENCOUNTER — Other Ambulatory Visit: Payer: Self-pay | Admitting: Internal Medicine

## 2023-05-21 ENCOUNTER — Other Ambulatory Visit: Payer: Self-pay | Admitting: Internal Medicine

## 2023-05-21 ENCOUNTER — Ambulatory Visit: Payer: BC Managed Care – PPO | Admitting: Internal Medicine

## 2023-05-21 ENCOUNTER — Encounter: Payer: Self-pay | Admitting: Internal Medicine

## 2023-05-21 VITALS — BP 126/78 | HR 75 | Ht 63.0 in | Wt 190.4 lb

## 2023-05-21 DIAGNOSIS — E1142 Type 2 diabetes mellitus with diabetic polyneuropathy: Secondary | ICD-10-CM | POA: Diagnosis not present

## 2023-05-21 DIAGNOSIS — E119 Type 2 diabetes mellitus without complications: Secondary | ICD-10-CM

## 2023-05-21 DIAGNOSIS — Z7985 Long-term (current) use of injectable non-insulin antidiabetic drugs: Secondary | ICD-10-CM | POA: Diagnosis not present

## 2023-05-21 DIAGNOSIS — Z794 Long term (current) use of insulin: Secondary | ICD-10-CM

## 2023-05-21 DIAGNOSIS — E785 Hyperlipidemia, unspecified: Secondary | ICD-10-CM | POA: Diagnosis not present

## 2023-05-21 DIAGNOSIS — E042 Nontoxic multinodular goiter: Secondary | ICD-10-CM | POA: Diagnosis not present

## 2023-05-21 DIAGNOSIS — Z7984 Long term (current) use of oral hypoglycemic drugs: Secondary | ICD-10-CM

## 2023-05-21 LAB — HEMOGLOBIN A1C: Hemoglobin A1C: 5.6

## 2023-05-21 NOTE — Patient Instructions (Addendum)
Please continue: - Metformin 1000 mg 2x a day with meals. - Jardiance 25 mg daily before b'fast - Trulicity 3 mcg weekly  Decrease: - Novolog 1:3 insulin to carb ratio for the day meals, but 1:4 for dinner - Lantus 10 units in a.m. and 30 units at bedtime (if sugars remain well controlled, stop the am Lantus)   Please return in 4 months.

## 2023-05-21 NOTE — Progress Notes (Signed)
Patient ID: Madison Cowan, female   DOB: 05-27-1961, 62 y.o.   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 Last visit 4 months ago.  Interim hx: No increased urination, nausea, chest pain.  She continues to have some blurry vision.  She is undergoing laser treatments now at [facility].  DM2: Reviewed HbA1c levels: 01/02/2023: HbA1c 6.0% Lab Results  Component Value Date   HGBA1C 5.6 09/24/2022   HGBA1C 6.3 (A) 05/22/2022   HGBA1C 6.8 (A) 01/08/2022   HGBA1C 7.0 (A) 12/28/2020   HGBA1C 6.8 (A) 04/20/2020   HGBA1C 7.3 (A) 12/15/2019   HGBA1C 6.4 (A) 08/09/2019   HGBA1C 6.7 (A) 12/16/2018   HGBA1C 7.2 (A) 08/12/2018   HGBA1C 6.6 (A) 04/10/2018   HGBA1C 6.8 10/28/2017   HGBA1C 6.2 07/29/2017   HGBA1C 6.4 04/23/2017   HGBA1C 9.3 12/12/2016   HGBA1C 11.3 09/10/2016   HGBA1C 13.3 05/07/2016   HGBA1C 7.9 (H) 12/19/2015   HGBA1C 8.0 09/08/2015   HGBA1C 7.8 05/31/2015   HGBA1C 8.1 (H) 01/18/2015  11/09/2021: HbA1c 6.8% 08/31/2021: HbA1c 6.3% 04/25/2021: HbA1c 7.2% 01/14/2020: HbA1c 6.7%  She is on: - Metformin 1000 mg 2x a day with meals. - Jardiance 25 mg daily before b'fast - Trulicity 1.5 >> 3 mg weekly - Levemir 50 units in a.m. And 70 units at bedtime >> ... Lantus 30 units twice a day >> 25 units in a.m. and 30 to 35 units at bedtime >> 20 units in am and 30 units at night - Novolog 15-25 units before meals - 1:2 insulin to carb ratio for the day meals, but 1:3 >> 1:4 for dinner  Pt checks her sugars 4 times a day with her CGM (with receiver):  Previously:  Previously:   Lowest sugar was 59 >> 50 >> 50s; she has hypoglycemia awareness in the 90s. Highest sugar was upper 200s >> 200 >> 200s.  Glucometer: One Touch ultra  Pt's meals are: - Breakfast: protein shakes (6g carbs); McDonads >> usually  skips - Lunch: may skip; salads cheeseburger; school cafeteria food >> may skip - Dinner: fast food; porc chops + collard greens + salad; hamburger; McNuggets - Snacks: sometimes; sweets, nuts, chips She does not have organized mealtimes.  -Mild CKD, last BUN/creatinine:  01/02/2023: 24/1.1, GFR 56.2, glucose 99 (will scan results) 08/23/2022: 26/1.29, GFR 47 08/31/2021: 20/1.04, GFR 66 04/25/2021: 18/1.12, GFR 53, Glu 144 01/14/2020: CMP normal with the exception of a high glucose at 116.  BUN/creatinine 19/0.9, GFR 69 Lab Results  Component Value Date   BUN 25 12/16/2018   BUN 20 10/15/2017   CREATININE 1.09 (H) 12/16/2018   CREATININE 0.92 10/15/2017  On Losartan.  -+ HL. Last set of lipids: 01/02/2023: 123/82/48/59 05/2022: labs per PCP: WNL reportedly 08/31/2021: 114/97/41/54 04/25/2021: 137/136/43/67 01/14/2020: 126/183/34/55 Lab Results  Component Value Date   CHOL 98 12/16/2018   HDL 29.30 (L) 12/16/2018   LDLCALC 44 12/16/2018   TRIG 120.0 12/16/2018   CHOLHDL 3 12/16/2018  On Crestor 10.  - last eye exam: 08/26/2022 + DR and Mac edema; eye exam in 10/2021: papilledema >> sent to ED directly >> CT head, then MRI/MRV, spinal tap, temporal aa. Bx (was on 60 mg Prednisone x3 weeks).  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 She is a glaucoma suspect. 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  - +  Numbness, tingling, and also burning in her feet.  She continues on Lyrica, B complex, alpha lipoic acid.  Last foot exam 05/2022.  She also has HTN.  In the past, she lost more than 100 pounds.  Thyroid nodules:  Reviewed her imaging reports and biopsies: Thyroid ultrasound (01/03/2016): Dominant nodule is a right mid lobe hypoechoic nodule of 2.1 x 0.8 x 1.2 cm, and previously measured up to 2.3 cm. There is one left thyroid nodule measuring 6 mm and several other tiny scattered nodules.  She had a benign biopsy of the dominant nodule.  Thyroid ultrasound  (04/29/2017): Previously biopsied right nodule is unchanged.  She has 2 additional subcentimeter thyroid nodules, that did not require follow-up.  Also, a hypoechoic lymph node on the right side of the neck, 1.9 cm x 0.5 cm.  Neck CT (11/19/2017): Thyroid: Small thyroid nodules as seen at ultrasound. No worrisome finding by CT. Largest nodule on the right measures 1.7 cm. Lymph nodes: No enlarged or low-density nodes on either side of the neck. Symmetric normal appearing lymph nodes by size criteria.  She had another thyroid U/S 10/31/2020.  After this, she was advised that one of the thyroid nodules need to be biopsied.  I was able to review the report in care everywhere: ISTHMUS:  - Size: 0.3 cm.   RIGHT LOBE:  - Size: 4.6 x 1.9 x 1.6 cm.  - Echogenicity: Heterogeneous.   LEFT LOBE:  - Size: 2.7 x 1.7 x 1.6 cm.  - Echogenicity: Heterogeneous.   NODULES:   Nodule 1:  -- Size: 1.8 x 1 x 1.3 cm (long x AP x trans).  -- Location: Right lower.  -- Composition: solid or almost completely solid (2)  -- Echogenicity: very hypoechoic (3)  -- Shape: wider-than-tall (0)  -- Margins: smooth (0)  -- Echogenic foci: punctate echogenic foci (3)  -- ACR TI-RADS total points and risk category: 8 Points - TR5.   Nodule 2:  -- Size: 0.9 x 0.7 x 1 cm (long x AP x trans).  -- Location: Right lower.  -- Composition: solid or almost completely solid (2)  -- Echogenicity: hypoechoic (2)  -- Shape: wider-than-tall (0)  -- Margins: smooth (0)  -- Echogenic foci: none (0)  -- ACR TI-RADS total points and risk category: 4 Points - TR4.   Nodule 3:  -- Size: 0.7 x 0.5 x 0.7 cm (long x AP x trans).  -- Location: Right lower.  -- Composition: cystic or almost completely cystic (0)  -- Echogenicity: anechoic (0)  -- Shape: wider-than-tall (0)  -- Margins: smooth (0)  -- Echogenic foci: peripheral calcifications (2)  -- ACR TI-RADS total points and risk category: 2 Points - TR2.   Nodule 4:  --  Size: 0.9 x 0.7 x 0.7 cm (long x AP x trans).  -- Location: Left lower.  -- Composition: solid or almost completely solid (2)  -- Echogenicity: hypoechoic (2)  -- Shape: wider-than-tall (0)  -- Margins: smooth (0)  -- Echogenic foci: peripheral calcifications (2)  -- ACR TI-RADS total points and risk category: 6 Points - TR4.   MISCELLANEOUS:  There are other scattered less than 5 mm nodules or cysts throughout both lobes.  Thyroid U/S (01/14/2022): Parenchymal Echotexture: Moderately heterogenous  Isthmus: 0.4 cm  Right lobe: 5.3 x 1.7 x 1.8 cm  Left lobe: 4.2 x 1.5 x 1.6 cm  _________________________________________________   Previously described nodule in the thyroid isthmus is less conspicuous on today's exam.  Nodule labeled 1 (previously 2) is a solid nodule in the right thyroid lobe that measures 2.2 x 1.1 x 0.9 cm previously 2.1 x 1.3 x 0.8 cm in June 2018. It was previously biopsied, and remains essentially similar in size and morphology.   Nodule labeled 2 (previously 3) is a small subcentimeter nodule more inferior right thyroid lobe that measures 0.8 x 0.6 x 0.5 cm, previously measuring between 0.5-0.7 cm. It was also previously biopsied, and remains essentially similar in size and morphology.   Nodule labeled 3 is a solid hypoechoic with punctate echogenic foci TR 5 nodule in the inferior left thyroid lobe that measures 0.8 x 0.8 x 0.7 cm, previously measuring up to 0.6 cm in June 2018.       IMPRESSION: 1. Multinodular thyroid gland. 2. Nodules labeled 1 and 2 in the right thyroid lobe has been previously biopsied and remain essentially unchanged since 2018. Correlate with biopsy results. 3. Nodule labeled 3 demonstrates slow growth with new microcalcifications that are more evident on today's exam when compared to 2018. Although this nodule could be benign given minimal change in size over 5 year span, biopsy is recommended due its change in appearance  as described.   FNA (01/31/2022): Clinical History: Solid hypoechoic with punctate echogenic foci TR-5  nodule in the inferior left thyroid lobe that measure 0.8 x 0.8 x 0.7cm  previously measuring up to 0.6cm in June 2018  Specimen Submitted:  A. THYROID, LEFT INFERIOR, FINE NEEDLE ASPIRATION:   FINAL MICROSCOPIC DIAGNOSIS:  - Consistent with benign follicular nodule (Bethesda category II)   SPECIMEN ADEQUACY:  Satisfactory for evaluation   Pt denies: - feeling nodules in neck - hoarseness - dysphagia - choking  Review latest TFTs: 01/02/2023:  TSH 1.80 04/25/2021: TSH 1.66 01/14/2020: TSH 1.12 Lab Results  Component Value Date   TSH 1.69 12/16/2018   She has thyroid eye disease. She does not have a history of Hashimoto's thyroiditis or Graves' disease.  She retired from a high stress job in 05/2021.  She greatly enjoys retirement.  ROS: + see HPI  I reviewed pt's medications, allergies, PMH, social hx, family hx, and changes were documented in the history of present illness. Otherwise, unchanged from my initial visit note.  Past Medical History:  Diagnosis Date   Allergy    Zyrtec daily.   Anemia    as a child only   Arthritis    DDD lumbar spine   Asthma    Albuterol seasonally.   Chronic kidney disease    III A- CFR 53   Diabetes mellitus without complication (HCC)    Glaucoma    Heart murmur    since birth   Hyperlipidemia    Hypertension    Neuromuscular disorder (HCC)    R third nerve palsy   OSA (obstructive sleep apnea) 11/11/2006   no CPAP   Seizures (HCC)    Thyroid disease    Thyroid nodules s/p needle biopsy negative.    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  Loves work.      Tobacco: 1/2 pdd x 20 years.      Alcohol:  None      Drugs: none       Education: [employer].       Exercise: No.      Seatbelt: 100%      Guns: unloaded.      Sexually active: none; total partners = 5.  Last STD screen 2006.     Current Outpatient Medications on File Prior to Visit  Medication Sig Dispense Refill   acetaZOLAMIDE (DIAMOX) 250 MG tablet Take by mouth.     amLODipine (NORVASC) 5 MG tablet Take 5 mg by mouth daily.     BD INSULIN SYRINGE U/F 31G X 5/16" 1 ML MISC USE AS DIRECTED TO INJECT INSULIN TWO TIMES DAILY 100 each 0   Cholecalciferol (VITAMIN D3 PO) Take by mouth. 5000 IU daily     Continuous Blood Gluc Receiver (FREESTYLE LIBRE 2 READER) DEVI USE DAILY TO MONITOR BLOOD GLUCOSE 6 each 3   Continuous Glucose Sensor (FREESTYLE LIBRE 2 SENSOR) MISC APPLY ONE SENSOR EVERY 14 DAYS TO MONITOR BLOOD GLUCOSE LEVELS 6 each 3   Dulaglutide (TRULICITY) 3 MG/0.5ML SOPN INJECT 3 MG UNDER THE SKIN ONCE WEEKLY 6 mL 3   hydrochlorothiazide (HYDRODIURIL) 12.5 MG tablet Take 12.5 mg by mouth daily.     insulin aspart (NOVOLOG FLEXPEN) 100 UNIT/ML FlexPen Inject 20-30 Units into the skin 3 (three) times daily with meals. Inject 20 to 30 units under the skin 3 times a day 15 minutes before a meal 30 mL 5   insulin glargine (LANTUS SOLOSTAR) 100 UNIT/ML Solostar Pen Inject 30 Units into the skin 2 (two) times daily. 45 mL 3   Insulin Pen Needle  (BD PEN NEEDLE NANO 2ND GEN) 32G X 4 MM MISC USE FIVE TIMES A DAY FOR INSULIN 200 each 3   JARDIANCE 25 MG TABS tablet TAKE ONE TABLET BY MOUTH DAILY 90 tablet 3   levocetirizine (XYZAL) 5 MG tablet Take 5 mg by mouth every evening.     losartan (COZAAR) 100 MG tablet Take 100 mg by mouth daily.     metFORMIN (GLUCOPHAGE) 1000 MG tablet Take 1 tablet (1,000 mg total) by mouth 2 (two) times daily. 180 tablet 3   Multiple Vitamins-Minerals (MULTIVITAMIN WITH MINERALS) tablet Take 1 tablet by mouth daily.     ONETOUCH ULTRA test strip TEST 3-4 TIMES DAILY 300 strip 2   pregabalin (LYRICA) 25 MG capsule Take 1-2 capsules (25-50 mg total) by mouth at bedtime. 60 capsule 5   rosuvastatin (CRESTOR) 10 MG tablet TAKE ONE TABLET BY MOUTH DAILY 30 tablet 0   SYMBICORT 160-4.5 MCG/ACT inhaler Inhale into the lungs.     Syringe/Needle, Disp, (SYRINGE 3CC/27GX1-1/4") 27G X 1-1/4" 3 ML MISC Use as instructed to inject insulin 100 each 0   vitamin B-12 (CYANOCOBALAMIN) 500 MCG tablet Take  500 mcg by mouth daily.     No current facility-administered medications on file prior to visit.   Allergies  Allergen Reactions   Covid-19 Mrna Vaccine Proofreader) [Covid-19 Mrna Vacc (Moderna)] Other (See Comments)   Family History  Problem Relation Age of Onset   Diabetes Mother    Hypertension Mother    Ovarian cancer Mother 35   Breast cancer Mother 22   Cancer Mother 76       ovarian cancer age 59; breast cancer age 23   Diabetes Father    Heart disease Father 38       CAD/CABG; AAA; CHF   Hyperlipidemia Father    Hypertension Father    Prostate cancer Father        dx. 82-83   Colon polyps Father        section of colon removed; unsure of #   Skin cancer Sister    Heart disease Sister 27       3-vessel CAD s/p 1 stenting; CABG   Diabetes Sister    Hyperlipidemia Sister    Hypertension Sister    Heart disease Brother        Died of CHF at age 39.   Hyperlipidemia Brother    Hypertension Brother     Congestive Heart Failure Brother    Esophageal cancer Brother    Cancer Brother 59       GI- esophageal; smoker   Heart disease Brother        MIs in 28s and CABG.   Diabetes Brother    Hypertension Brother    Skin cancer Maternal Aunt        multiple - unknown type; dx. 38s   Pancreatic cancer Maternal Uncle        dx. 70s   Stroke Maternal Grandmother    Hypertension Maternal Grandmother    Skin cancer Maternal Grandfather        multiple - unknown types; dx. 2s   Cancer Maternal Grandfather    Heart Problems Paternal Grandfather    Crohn's disease Neg Hx    Rectal cancer Neg Hx    Stomach cancer Neg Hx    Colon cancer Neg Hx    PE: BP 126/78   Pulse 75   Ht 5\' 3"  (1.6 m)   Wt 190 lb 6.4 oz (86.4 kg)   SpO2 98%   BMI 33.73 kg/m  Wt Readings from Last 3 Encounters:  05/21/23 190 lb 6.4 oz (86.4 kg)  01/30/23 194 lb 3.2 oz (88.1 kg)  09/24/22 195 lb 6.4 oz (88.6 kg)   Constitutional: overweight, in NAD Eyes:  EOMI, no exophthalmos ENT: no thyromegaly, no cervical lymphadenopathy Cardiovascular: RRR, No MRG Respiratory: CTA B Musculoskeletal: no deformities Skin: no rashes Neurological: no tremor with outstretched hands  ASSESSMENT: 1. DM2, insulin-dependent, now controlled, with complications - Peripheral neuropathy.  2. MNG  3. HL  PLAN:  1. Patient with longstanding, well-controlled, type 2 diabetes, on a complex medication regimen with metformin, SGLT2 inhibitor, weekly GLP-1 receptor agonist and basal-bolus insulin regimen, adjusted at last visit.  At that time, sugars appears to be increasing overnight, higher than goal in the morning and then improving later in the day but dropping again after dinner.  She was on a very strict insulin to carb ratio, of 1: 2, so I advised her to relax this to 1: 3 with dinner.  Since the sugars were increasing gradually throughout the night, I advised her to increase the  Lantus dose at night.  Since she had occasional low  blood sugars during the day, I advised her to reduce her morning Lantus dose.  Latest HbA1c was 6.0% in 12/2022. CGM interpretation -At today's visit, we reviewed her CGM downloads: It appears that 99% of values are in target range (goal >70%), while 1% are higher than 180 (goal <25%), and 0% are lower than 70 (goal <4%).  The calculated average blood sugar is 116.  The projected HbA1c for the next 3 months (GMI) is 6.1%. -Reviewing the CGM trends, sugars are very well controlled with occasional low blood sugars after meals.  Sugars are more fluctuating and higher overnight.  For now, we discussed about reducing the doses of her mealtime insulin by relaxing her insulin to carb ratios.  I also advised her to try to decrease her Lantus dose in the morning and I am hoping that we can stop this completely.  I did advise her that before stopping it, she may need to increase the Lantus dose at night by 5 units possibly.  We can continue the rest of the regimen for now. - I suggested to:  Patient Instructions  Please continue: - Metformin 1000 mg 2x a day with meals. - Jardiance 25 mg daily before b'fast - Trulicity 3 mcg weekly  Decrease: - Novolog 1:3 insulin to carb ratio for the day meals, but 1:4 for dinner - Lantus 10 units in a.m. and 30 units at bedtime (if sugars remain well controlled, stop the am Lantus)   Please return in 4 months.  - HbA1c today: 5.6% (lower) - advised to check sugars at different times of the day - 4x a day, rotating check times - advised for yearly eye exams >> she is UTD - return to clinic in 3-4 months  2. MNG -Reviewed thyroid ultrasound report from 2019: Stable right dominant nodule, which was previously biopsied with benign results. She had a neck CT afterwards which did not show any worrisome nodules in her neck.. She saw her PCP in 10/2020, and at that time the nodules were felt to be enlarged.  Another thyroid ultrasound was checked (I do not have the images,  only the report) and this showed mostly subcentimeter nodules while the dominant right nodule was 1.8 cm in the largest dimension.  This nodule was biopsied with benign results in the past and, per review of records, this is actually decreased in size on the latest ultrasound.  Therefore, I did not suggest any biopsy at that time. Latest thyroid ultrasound is from 01/2022:  2 of the dominant nodules were stable (these were previously biopsied with benign results), but the left inferior nodule was slightly larger and had punctate echogenic foci.  We biopsied the nodule and the results were benign -She has no neck compression symptoms -Will continue to keep an eye on the nodules, but no intervention is needed for now  3. HL -Reviewed latest lipid panel from 12/2022, all fractions at goal -Continues Crestor 10 mg daily without side effects  Carlus Pavlov, MD PhD [facility]

## 2023-08-19 ENCOUNTER — Other Ambulatory Visit: Payer: Self-pay | Admitting: Internal Medicine

## 2023-08-19 DIAGNOSIS — E1142 Type 2 diabetes mellitus with diabetic polyneuropathy: Secondary | ICD-10-CM

## 2023-08-21 ENCOUNTER — Ambulatory Visit
Admission: RE | Admit: 2023-08-21 | Discharge: 2023-08-21 | Disposition: A | Discharge status: Home / Self Care | Payer: BC Managed Care – PPO | Source: Ambulatory Visit | Attending: Family Medicine | Admitting: Family Medicine

## 2023-08-21 ENCOUNTER — Other Ambulatory Visit: Payer: Self-pay | Admitting: Family Medicine

## 2023-08-21 DIAGNOSIS — S99921A Unspecified injury of right foot, initial encounter: Secondary | ICD-10-CM

## 2023-08-22 ENCOUNTER — Telehealth: Payer: Self-pay

## 2023-08-22 DIAGNOSIS — E1142 Type 2 diabetes mellitus with diabetic polyneuropathy: Secondary | ICD-10-CM

## 2023-08-22 MED ORDER — LYUMJEV KWIKPEN 200 UNIT/ML ~~LOC~~ SOPN: 20.0000 [IU] | PEN_INJECTOR | Freq: Three times a day (TID) | SUBCUTANEOUS | 2 refills | Status: DC

## 2023-08-22 NOTE — Telephone Encounter (Signed)
Received a fax from the pharmacy stating that Novolog is not on her formulary. Our options are: 1.Medical Necessity PA  2. Switch the medication (they did not list any alternatives)  Please Advise

## 2023-08-22 NOTE — Telephone Encounter (Signed)
We can try Lyumjev U200 but if not covered, then Humalog.  We can keep the same doses, but for Lyumjev, this needs to be taken right before meals, rather than 15 minutes before.

## 2023-08-25 ENCOUNTER — Other Ambulatory Visit (HOSPITAL_COMMUNITY): Payer: Self-pay

## 2023-08-25 NOTE — Telephone Encounter (Signed)
Please run PA for Trulicity

## 2023-08-26 MED ORDER — BD PEN NEEDLE NANO 2ND GEN 32G X 4 MM MISC: 3 refills | Status: DC

## 2023-08-26 NOTE — Addendum Note (Signed)
Addended by: Pollie Meyer on: 08/26/2023 02:30 PM   Modules accepted: Orders

## 2023-09-02 MED ORDER — TRULICITY 3 MG/0.5ML ~~LOC~~ SOAJ: SUBCUTANEOUS | 3 refills | Status: DC

## 2023-09-02 NOTE — Addendum Note (Signed)
Addended by: Pollie Meyer on: 09/02/2023 09:43 AM   Modules accepted: Orders

## 2023-09-09 ENCOUNTER — Telehealth: Payer: Self-pay | Admitting: Pharmacy Technician

## 2023-09-09 ENCOUNTER — Other Ambulatory Visit (HOSPITAL_COMMUNITY): Payer: Self-pay

## 2023-09-09 DIAGNOSIS — E1142 Type 2 diabetes mellitus with diabetic polyneuropathy: Secondary | ICD-10-CM

## 2023-09-09 NOTE — Telephone Encounter (Signed)
Pharmacy Patient Advocate Encounter   Received notification from CoverMyMeds that prior authorization for Lyumjev 200u/ml is required/requested.   Insurance verification completed.   The patient is insured through [facility] ADVANTAGE/RX ADVANCE .   Per test claim:  Novolog or Candie Mile is preferred by the insurance.  If suggested medication is appropriate, Please send in a new RX and discontinue this one. If not, please advise as to why it's not appropriate so that we may request a Prior Authorization. I saw your previous note. I'm not sure why it told you the pt's ins wouldn't cover the novolog any more. It tells me it's too soon, b/c she had it filled recently, but Fiasp would be $0. I also realize the novolog and fiasp are both 100u/ml, vs the 200u/ml. If Dr Elvera Lennox feels the 200u/ml is necessary, I can do the PA for it or Humalog 200. Just let us know.

## 2023-09-15 MED ORDER — FIASP FLEXTOUCH 100 UNIT/ML ~~LOC~~ SOPN: 20.0000 [IU] | PEN_INJECTOR | Freq: Three times a day (TID) | SUBCUTANEOUS | 2 refills | Status: DC

## 2023-09-16 ENCOUNTER — Other Ambulatory Visit (HOSPITAL_COMMUNITY): Payer: Self-pay

## 2023-09-24 ENCOUNTER — Encounter: Payer: Self-pay | Admitting: Internal Medicine

## 2023-09-24 ENCOUNTER — Ambulatory Visit: Payer: BC Managed Care – PPO | Admitting: Internal Medicine

## 2023-09-24 VITALS — BP 124/70 | HR 78 | Ht 63.0 in | Wt 187.4 lb

## 2023-09-24 DIAGNOSIS — Z794 Long term (current) use of insulin: Secondary | ICD-10-CM | POA: Diagnosis not present

## 2023-09-24 DIAGNOSIS — E042 Nontoxic multinodular goiter: Secondary | ICD-10-CM | POA: Diagnosis not present

## 2023-09-24 DIAGNOSIS — Z7984 Long term (current) use of oral hypoglycemic drugs: Secondary | ICD-10-CM

## 2023-09-24 DIAGNOSIS — E1142 Type 2 diabetes mellitus with diabetic polyneuropathy: Secondary | ICD-10-CM | POA: Diagnosis not present

## 2023-09-24 DIAGNOSIS — E785 Hyperlipidemia, unspecified: Secondary | ICD-10-CM

## 2023-09-24 DIAGNOSIS — Z7985 Long-term (current) use of injectable non-insulin antidiabetic drugs: Secondary | ICD-10-CM

## 2023-09-24 LAB — POCT GLYCOSYLATED HEMOGLOBIN (HGB A1C): Hemoglobin A1C: 5.7 % — AB (ref 4.0–5.6)

## 2023-09-24 LAB — MICROALBUMIN / CREATININE URINE RATIO
Creatinine,U: 15.7 mg/dL
Microalb Creat Ratio: 14.7 mg/g (ref 0.0–30.0)
Microalb, Ur: 2.3 mg/dL — ABNORMAL HIGH (ref 0.0–1.9)

## 2023-09-24 MED ORDER — FIASP FLEXTOUCH 100 UNIT/ML ~~LOC~~ SOPN: 10.0000 [IU] | PEN_INJECTOR | Freq: Two times a day (BID) | SUBCUTANEOUS | Status: DC

## 2023-09-24 MED ORDER — EMPAGLIFLOZIN 25 MG PO TABS: 25.0000 mg | ORAL_TABLET | Freq: Every day | ORAL | 3 refills | Status: DC

## 2023-09-24 MED ORDER — METFORMIN HCL 1000 MG PO TABS: 1000.0000 mg | ORAL_TABLET | Freq: Two times a day (BID) | ORAL | 3 refills | Status: DC

## 2023-09-24 MED ORDER — BD PEN NEEDLE NANO 2ND GEN 32G X 4 MM MISC: 3 refills | Status: DC

## 2023-09-24 MED ORDER — LANTUS SOLOSTAR 100 UNIT/ML ~~LOC~~ SOPN
30.0000 [IU] | PEN_INJECTOR | Freq: Every day | SUBCUTANEOUS | 3 refills | Status: DC
Start: 2023-09-24 — End: 2023-10-27

## 2023-09-24 NOTE — Patient Instructions (Addendum)
Please continue: - Metformin 1000 mg 2x a day with meals. - Jardiance 25 mg daily before b'fast  Try to restart: - Trulicity 3 mcg weekly  Change: - Fiasp 1:4 insulin to carb ratio for the day meals, but 1:5 for dinner - Lantus 30 units at bedtime   Please return in 4 months.

## 2023-09-24 NOTE — Progress Notes (Signed)
Patient ID: Madison Cowan, female   DOB: February 20, 1961, 62 y.o.   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 Last visit 4 months ago.  Interim hx: No increased urination, nausea, chest pain.  She feels well, and lost more weight since last visit.  She is now off Trulicity for 3 weeks - lack of coverage.  DM2: Reviewed HbA1c levels: Lab Results  Component Value Date   HGBA1C 5.6 05/21/2023   HGBA1C 5.6 09/24/2022   HGBA1C 6.3 (A) 05/22/2022   HGBA1C 6.8 (A) 01/08/2022   HGBA1C 7.0 (A) 12/28/2020   HGBA1C 6.8 (A) 04/20/2020   HGBA1C 7.3 (A) 12/15/2019   HGBA1C 6.4 (A) 08/09/2019   HGBA1C 6.7 (A) 12/16/2018   HGBA1C 7.2 (A) 08/12/2018   HGBA1C 6.6 (A) 04/10/2018   HGBA1C 6.8 10/28/2017   HGBA1C 6.2 07/29/2017   HGBA1C 6.4 04/23/2017   HGBA1C 9.3 12/12/2016   HGBA1C 11.3 09/10/2016   HGBA1C 13.3 05/07/2016   HGBA1C 7.9 (H) 12/19/2015   HGBA1C 8.0 09/08/2015   HGBA1C 7.8 05/31/2015   01/02/2023: HbA1c 6.0% 11/09/2021: HbA1c 6.8% 08/31/2021: HbA1c 6.3% 04/25/2021: HbA1c 7.2% 01/14/2020: HbA1c 6.7%  She is on: - Metformin 1000 mg 2x a day with meals. - Jardiance 25 mg daily before b'fast - Trulicity 1.5 >> 3 mg weekly - off for 3 weeks - Levemir 50 units in a.m. And 70 units at bedtime >> ... Lantus 30 units twice a day >> 25 units in a.m. and 30 to 35 units at bedtime >> 20 units in am and 30 units at night >> 10 units in a.m. and 30 units at night - Novolog 15-25 units before meals - 1:2 insulin to carb ratio for the day meals, but 1:3 >> 1:4 for dinner >> 1:3 insulin to carb ratio for the day meals, but 1:4 for dinner (did not start FiAsp yet).  Pt checks her sugars 4 times a day with her CGM (with receiver):  Prev.:  Previously:   Lowest sugar was 59 >> 50 >> 50s; she has hypoglycemia  awareness in the 90s. Highest sugar was upper 200s >> 200 >> 200s.  Glucometer: One Touch ultra  Pt's meals are: - Breakfast: protein shakes (6g carbs); McDonads >> usually skips - Lunch: may skip; salads cheeseburger; school cafeteria food >> may skip - Dinner: fast food; porc chops + collard greens + salad; hamburger; McNuggets - Snacks: sometimes; sweets, nuts, chips She does not have organized mealtimes.  -Mild CKD, last BUN/creatinine:   01/02/2023: 24/1.1, GFR 56.2, glucose 99 08/23/2022: 26/1.29, GFR 47 08/31/2021: 20/1.04, GFR 66 04/25/2021: 18/1.12, GFR 53, Glu 144 01/14/2020: CMP normal with the exception of a high glucose at 116.  BUN/creatinine 19/0.9, GFR 69 Lab Results  Component Value Date   BUN 25 12/16/2018   BUN 20 10/15/2017   CREATININE 1.09 (H) 12/16/2018   CREATININE 0.92 10/15/2017   Lab Results  Component Value Date   MICRALBCREAT 1.5 12/16/2018  On Losartan.  -+ HL. Last set of lipids:  01/02/2023: 123/82/48/59 05/2022: labs per PCP: WNL reportedly 08/31/2021: 114/97/41/54 04/25/2021: 137/136/43/67 01/14/2020: 126/183/34/55 Lab Results  Component Value Date   CHOL 98 12/16/2018   HDL 29.30 (L) 12/16/2018   LDLCALC 44 12/16/2018   TRIG 120.0 12/16/2018   CHOLHDL 3 12/16/2018  On Crestor 10.  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 Bx (was on 60 mg Prednisone x3 weeks).  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 She is a glaucoma suspect. 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  - + Numbness, tingling, and also burning in her feet.  She continues on Lyrica, B complex, alpha lipoic acid.  Last foot exam 05/21/2023.  She also has HTN.  In the past, she lost more than 100 pounds.  Thyroid nodules:  Reviewed her imaging reports and biopsies: Thyroid ultrasound (01/03/2016): Dominant nodule is a  right mid lobe hypoechoic nodule of 2.1 x 0.8 x 1.2 cm, and previously measured up to 2.3 cm. There is one left thyroid nodule measuring 6 mm and several other tiny scattered nodules.  She had a benign biopsy of the dominant nodule.  Thyroid ultrasound (04/29/2017): Previously biopsied right nodule is unchanged.  She has 2 additional subcentimeter thyroid nodules, that did not require follow-up.  Also, a hypoechoic lymph node on the right side of the neck, 1.9 cm x 0.5 cm.  Neck CT (11/19/2017): Thyroid: Small thyroid nodules as seen at ultrasound. No worrisome finding by CT. Largest nodule on the right measures 1.7 cm. Lymph nodes: No enlarged or low-density nodes on either side of the neck. Symmetric normal appearing lymph nodes by size criteria.  She had another thyroid U/S 10/31/2020.  After this, she was advised that one of the thyroid nodules need to be biopsied.  I was able to review the report in care everywhere: ISTHMUS:  - Size: 0.3 cm.   RIGHT LOBE:  - Size: 4.6 x 1.9 x 1.6 cm.  - Echogenicity: Heterogeneous.   LEFT LOBE:  - Size: 2.7 x 1.7 x 1.6 cm.  - Echogenicity: Heterogeneous.   NODULES:   Nodule 1:  -- Size: 1.8 x 1 x 1.3 cm (long x AP x trans).  -- Location: Right lower.  -- Composition: solid or almost completely solid (2)  -- Echogenicity: very hypoechoic (3)  -- Shape: wider-than-tall (0)  -- Margins: smooth (0)  -- Echogenic foci: punctate echogenic foci (3)  -- ACR TI-RADS total points and risk category: 8 Points - TR5.   Nodule 2:  -- Size: 0.9 x 0.7 x 1 cm (long x AP x trans).  -- Location: Right lower.  -- Composition: solid or almost completely solid (2)  -- Echogenicity: hypoechoic (2)  -- Shape: wider-than-tall (0)  -- Margins: smooth (0)  -- Echogenic foci: none (0)  -- ACR TI-RADS total points and risk category: 4 Points - TR4.   Nodule 3:  -- Size: 0.7 x 0.5 x 0.7 cm (long x AP x trans).  -- Location: Right lower.  -- Composition: cystic or  almost completely cystic (0)  -- Echogenicity: anechoic (0)  -- Shape: wider-than-tall (0)  -- Margins: smooth (0)  -- Echogenic foci: peripheral calcifications (2)  -- ACR TI-RADS total points and risk category: 2 Points - TR2.   Nodule 4:  -- Size: 0.9 x 0.7 x 0.7 cm (long x AP x trans).  -- Location: Left lower.  -- Composition: solid or almost completely solid (2)  -- Echogenicity: hypoechoic (2)  -- Shape: wider-than-tall (0)  -- Margins: smooth (0)  -- Echogenic foci: peripheral calcifications (2)  -- ACR TI-RADS total points and risk category: 6 Points - TR4.   MISCELLANEOUS:  There are other scattered less than 5 mm nodules or cysts throughout both lobes.  Thyroid U/S (01/14/2022): Parenchymal Echotexture: Moderately heterogenous  Isthmus: 0.4 cm  Right lobe: 5.3 x 1.7 x 1.8 cm  Left lobe: 4.2 x 1.5 x 1.6 cm  _________________________________________________   Previously described nodule in the thyroid isthmus is less conspicuous on today's exam.   Nodule labeled 1 (previously 2) is a solid nodule in the right thyroid lobe that measures 2.2 x 1.1 x 0.9 cm previously 2.1 x 1.3 x 0.8 cm in June 2018. It was previously biopsied, and remains essentially similar in size and morphology.   Nodule labeled 2 (previously 3) is a small subcentimeter nodule more inferior right thyroid lobe that measures 0.8 x 0.6 x 0.5 cm, previously measuring between 0.5-0.7 cm. It was also previously biopsied, and remains essentially similar in size and morphology.   Nodule labeled 3 is a solid hypoechoic with punctate echogenic foci TR 5 nodule in the inferior left thyroid lobe that measures 0.8 x 0.8 x 0.7 cm, previously measuring up to 0.6 cm in June 2018.       IMPRESSION: 1. Multinodular thyroid gland. 2. Nodules labeled 1 and 2 in the right thyroid lobe has been previously biopsied and remain essentially unchanged since 2018. Correlate with biopsy results. 3. Nodule labeled 3  demonstrates slow growth with new microcalcifications that are more evident on today's exam when compared to 2018. Although this nodule could be benign given minimal change in size over 5 year span, biopsy is recommended due its change in appearance as described.   FNA (01/31/2022): Clinical History: Solid hypoechoic with punctate echogenic foci TR-5  nodule in the inferior left thyroid lobe that measure 0.8 x 0.8 x 0.7cm  previously measuring up to 0.6cm in June 2018  Specimen Submitted:  A. THYROID, LEFT INFERIOR, FINE NEEDLE ASPIRATION:   FINAL MICROSCOPIC DIAGNOSIS:  - Consistent with benign follicular nodule (Bethesda category II)   SPECIMEN ADEQUACY:  Satisfactory for evaluation   Pt denies: - feeling nodules in neck - hoarseness - dysphagia - choking  Review latest TFTs:  01/02/2023:  TSH 1.80 04/25/2021: TSH 1.66 01/14/2020: TSH 1.12 Lab Results  Component Value Date   TSH 1.69 12/16/2018   She has thyroid eye disease. She does not have a history of Hashimoto's thyroiditis or Graves' disease.  She retired from a high stress job in 05/2021.  She greatly enjoys retirement.  ROS: + see HPI  I reviewed pt's medications, allergies, PMH, social hx, family hx, and changes were documented in the history of present illness. Otherwise, unchanged from my initial visit note.  Past Medical History:  Diagnosis Date   Allergy    Zyrtec daily.   Anemia    as a child only   Arthritis    DDD lumbar spine   Asthma    Albuterol seasonally.   Chronic kidney disease    III A- CFR 53   Diabetes mellitus without complication (HCC)    Glaucoma    Heart murmur    since birth   Hyperlipidemia    Hypertension    Neuromuscular disorder (HCC)    R third nerve palsy   OSA (obstructive sleep apnea) 11/11/2006   no CPAP   Seizures (HCC)    Thyroid disease    Thyroid nodules s/p needle biopsy negative.    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  Loves work.      Tobacco: 1/2 pdd x 20 years.      Alcohol:  None      Drugs: none       Education: [employer].       Exercise: No.      Seatbelt: 100%      Guns: unloaded.      Sexually active: none; total partners = 5.  Last STD screen 2006.     Current Outpatient Medications on File Prior to Visit  Medication Sig Dispense Refill   acetaZOLAMIDE (DIAMOX) 250 MG tablet Take by mouth. 2 tabs BID     amLODipine (NORVASC) 5 MG tablet Take 5 mg by mouth daily.     BD INSULIN SYRINGE U/F 31G X 5/16" 1 ML MISC USE AS DIRECTED TO INJECT INSULIN TWO TIMES DAILY 100 each 0   Cholecalciferol (VITAMIN D3 PO) Take by mouth. 5000 IU daily     Continuous Blood Gluc Receiver (FREESTYLE LIBRE 2 READER) DEVI USE DAILY TO MONITOR BLOOD GLUCOSE 6 each 3   Continuous Glucose Sensor (FREESTYLE LIBRE 2 SENSOR) MISC APPLY ONE SENSOR EVERY 14 DAYS TO MONITOR BLOOD GLUCOSE LEVELS 6 each 3   Dulaglutide (TRULICITY) 3 MG/0.5ML SOAJ INJECT 3 MG UNDER THE SKIN ONCE WEEKLY DxCode:E11.42 6 mL 3   hydrochlorothiazide (HYDRODIURIL) 12.5 MG tablet Take 12.5 mg by mouth daily.     insulin aspart (FIASP FLEXTOUCH) 100 UNIT/ML FlexTouch Pen  Inject 20-30 Units into the skin 3 (three) times daily before meals. 90 mL 2   insulin glargine (LANTUS SOLOSTAR) 100 UNIT/ML Solostar Pen Inject 30 Units into the skin 2 (two) times daily. 45 mL 3   Insulin Lispro-aabc (LYUMJEV KWIKPEN) 200 UNIT/ML KwikPen Inject 20-30 Units into the skin 3 (three) times daily before meals. 30 mL 2   Insulin Pen Needle (BD PEN NEEDLE NANO 2ND GEN) 32G X 4 MM MISC USE FIVE TIMES A DAY FOR INSULIN 500 each 3   JARDIANCE 25 MG TABS tablet TAKE ONE TABLET BY MOUTH DAILY 90 tablet 3   levocetirizine (XYZAL) 5 MG tablet Take 5 mg by mouth every evening.     losartan (COZAAR) 100 MG tablet Take 100 mg by mouth daily.     metFORMIN (GLUCOPHAGE) 1000 MG tablet Take 1 tablet (1,000 mg total) by mouth 2 (two) times daily. 180 tablet 3   Multiple Vitamins-Minerals (MULTIVITAMIN WITH MINERALS) tablet Take 1 tablet by mouth daily.     ONETOUCH ULTRA test strip TEST 3-4 TIMES DAILY 300 strip 2   pregabalin (  LYRICA) 25 MG capsule Take 1-2 capsules (25-50 mg total) by mouth at bedtime. 60 capsule 5   rosuvastatin (CRESTOR) 10 MG tablet TAKE ONE TABLET BY MOUTH DAILY 30 tablet 0   SYMBICORT 160-4.5 MCG/ACT inhaler Inhale into the lungs.     Syringe/Needle, Disp, (SYRINGE 3CC/27GX1-1/4") 27G X 1-1/4" 3 ML MISC Use as instructed to inject insulin 100 each 0   vitamin B-12 (CYANOCOBALAMIN) 500 MCG tablet Take 500 mcg by mouth daily.     No current facility-administered medications on file prior to visit.   Allergies  Allergen Reactions   Covid-19 Mrna Vaccine Proofreader) [Covid-19 Mrna Vacc (Moderna)] Other (See Comments)   Family History  Problem Relation Age of Onset   Diabetes Mother    Hypertension Mother    Ovarian cancer Mother 41   Breast cancer Mother 86   Cancer Mother 3       ovarian cancer age 69; breast cancer age 20   Diabetes Father    Heart disease Father 61       CAD/CABG; AAA; CHF   Hyperlipidemia Father    Hypertension Father    Prostate cancer Father         dx. 82-83   Colon polyps Father        section of colon removed; unsure of #   Skin cancer Sister    Heart disease Sister 51       3-vessel CAD s/p 1 stenting; CABG   Diabetes Sister    Hyperlipidemia Sister    Hypertension Sister    Heart disease Brother        Died of CHF at age 37.   Hyperlipidemia Brother    Hypertension Brother    Congestive Heart Failure Brother    Esophageal cancer Brother    Cancer Brother 86       GI- esophageal; smoker   Heart disease Brother        MIs in 80s and CABG.   Diabetes Brother    Hypertension Brother    Skin cancer Maternal Aunt        multiple - unknown type; dx. 5s   Pancreatic cancer Maternal Uncle        dx. 70s   Stroke Maternal Grandmother    Hypertension Maternal Grandmother    Skin cancer Maternal Grandfather        multiple - unknown types; dx. 75s   Cancer Maternal Grandfather    Heart Problems Paternal Grandfather    Crohn's disease Neg Hx    Rectal cancer Neg Hx    Stomach cancer Neg Hx    Colon cancer Neg Hx    PE: BP 124/70   Pulse 78   Ht 5\' 3"  (1.6 m)   Wt 187 lb 6.4 oz (85 kg)   SpO2 99%   BMI 33.20 kg/m  Wt Readings from Last 3 Encounters:  09/24/23 187 lb 6.4 oz (85 kg)  05/21/23 190 lb 6.4 oz (86.4 kg)  01/30/23 194 lb 3.2 oz (88.1 kg)   Constitutional: overweight, in NAD Eyes:  EOMI, no exophthalmos ENT: no thyromegaly, no cervical lymphadenopathy Cardiovascular: RRR, No MRG Respiratory: CTA B Musculoskeletal: no deformities Skin: no rashes Neurological: no tremor with outstretched hands  ASSESSMENT: 1. DM2, insulin-dependent, now controlled, with complications - Peripheral neuropathy.  2. MNG  3. HL  PLAN:  1. Patient with longstanding, well-controlled, type 2 diabetes, on a complex medication regimen including metformin, SGLT2 inhibitor, weekly GLP-1 receptor agonist, and basal-bolus insulin,  adjusted at last visit.  At that time, sugars were very well-controlled with occasional  low values after meals.  Sugars were more fluctuating and higher overnight.  We reduced the dose of her mealtime insulin by relaxing her insulin to carb ratios and we also decreased her Lantus dose in the morning and discussed about possibly stopping it completely if the sugars remained well-controlled.  I did advise her that, before stopping it, she may need to increase the Lantus at night by 5 units possibly.  We did not change the rest of the regimen.  HbA1c at that time was excellent, at 5.6%, decreased.  Another HbA1c obtained 5 months ago was slightly higher, at 5.9%. CGM interpretation -At today's visit, we reviewed her CGM downloads: It appears that 95% of values are in target range (goal >70%), while 3% are higher than 180 (goal <25%), and 2% are lower than 70 (goal <4%).  The calculated average blood sugar is 115.  The projected HbA1c for the next 3 months (GMI) is 6.1%. -Reviewing the CGM trends, sugars are fluctuating within a narrow range in the target interval.  She has occasional slightly low blood sugars despite the fact that she has been on Trulicity for 3 weeks due to lack of coverage.  I advised her to stop her Lantus morning dose and also reduce her insulin with meals.  She is having 1-2 meals a day. -We called her insurance and it appears that Trulicity should be covered for her.  We also called the pharmacy and they had another insurance on file.  I am hoping that this problem can be solved as she is doing wonderful on Trulicity. - I suggested to:  Patient Instructions  Please continue: - Metformin 1000 mg 2x a day with meals. - Jardiance 25 mg daily before b'fast  Try to restart: - Trulicity 3 mcg weekly  Change: - Fiasp 1:4 insulin to carb ratio for the day meals, but 1:5 for dinner - Lantus 30 units at bedtime   Please return in 4 months.  - we checked her HbA1c: 5.7% (higher) - advised to check sugars at different times of the day - 4x a day, rotating check times -  advised for yearly eye exams >> she is UTD - return to clinic in 3-4 months  2. MNG -Reviewed her thyroid ultrasound report from 2019: Stable right dominant nodule, which was previously biopsied with benign results. She had a neck CT afterwards which did not show any worrisome nodules in her neck.. She saw her PCP in 10/2020, and at that time the nodules were felt to be enlarged.  Another thyroid ultrasound was checked (I do not have the images, only the report) and this showed mostly subcentimeter nodules while the dominant right nodule was 1.8 cm in the largest dimension.  This nodule was biopsied with benign results in the past and, per review of records, this is actually decreased in size on the latest ultrasound.  Therefore, I did not suggest any biopsy at that time. Latest thyroid ultrasound is from 01/2022:  2 of the dominant nodules were stable (these were previously biopsied with benign results), but the left inferior nodule was slightly larger and had punctate echogenic foci.  We biopsied the nodule with benign results. -She has no neck compression symptom. -Latest TSH was reviewed from 04/2023 and it was normal -Will continue to keep an eye on the thyroid nodules, but no intervention is needed for now  3. HL - Reviewed latest  lipid panel from 04/2023:  fractions at goal with exception of a slightly low HDL - Continues Crestor 10 mg daily without side effects.  Carlus Pavlov, MD PhD [facility]

## 2023-09-26 ENCOUNTER — Other Ambulatory Visit (HOSPITAL_COMMUNITY): Payer: Self-pay

## 2023-09-26 ENCOUNTER — Telehealth: Payer: Self-pay

## 2023-09-26 NOTE — Addendum Note (Signed)
Addended by: Pollie Meyer on: 09/26/2023 08:45 AM   Modules accepted: Orders

## 2023-09-26 NOTE — Telephone Encounter (Signed)
Patient and I both reached out to [employer] and its showing that a PA is needed for her Trulicity. Her insurance told us to put the diagnosis code in the sig for them to cover it but she is still not able to get her medication. Can we try and push a PA through to see what we get?   Patient currently has Type 2 Diabetes Dx code: E11.42 Last A1c: 5.7% Currently on Insulin: Fiasp and Lantus

## 2023-09-29 NOTE — Telephone Encounter (Signed)
I called and left a detailed message for the pharmacy letting them know that PA is not required. Pt has also been notified about this as well.

## 2023-10-25 ENCOUNTER — Other Ambulatory Visit: Payer: Self-pay | Admitting: Internal Medicine

## 2023-11-07 ENCOUNTER — Telehealth: Payer: Self-pay

## 2023-11-07 ENCOUNTER — Encounter: Payer: Self-pay | Admitting: Internal Medicine

## 2023-11-07 DIAGNOSIS — E1142 Type 2 diabetes mellitus with diabetic polyneuropathy: Secondary | ICD-10-CM

## 2023-11-07 NOTE — Telephone Encounter (Signed)
Patient needs a PA for Trulicity.   Last A1c: 5.7 Dx Code: E11.42 Pt is also currently on insulin.

## 2023-11-10 ENCOUNTER — Other Ambulatory Visit (HOSPITAL_COMMUNITY): Payer: Self-pay

## 2023-11-11 ENCOUNTER — Other Ambulatory Visit (HOSPITAL_COMMUNITY): Payer: Self-pay

## 2023-11-11 NOTE — Telephone Encounter (Signed)
 Per test claim, medication does not require a prior authorization. Insurance only pays 1 month at a time and co-pay is $30.00

## 2023-11-19 IMAGING — US US FNA BIOPSY THYROID 1ST LESION
1 series · 10 of 10 positions shown · non-contrast
Comparison: Thyroid ultrasound 01/14/2022

MEDICATIONS:
None

COMPLICATIONS:
None immediate.

INDICATION: Indeterminate thyroid nodule

EXAM:
ULTRASOUND GUIDED FINE NEEDLE ASPIRATION OF INDETERMINATE THYROID
NODULE
TECHNIQUE: Informed written consent was obtained from the patient after a
discussion of the risks, benefits and alternatives to treatment.
Questions regarding the procedure were encouraged and answered. A
timeout was performed prior to the initiation of the procedure.

[Series 1: us fna biopsy thyroid 1st lesion · 0.05mm/px · 10 acquisitions, 10 frames shown]
[im 1/10]
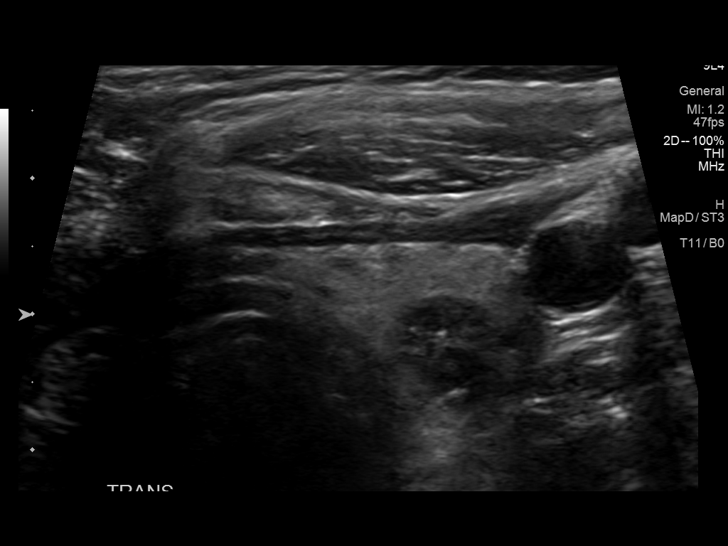
[im 2/10]
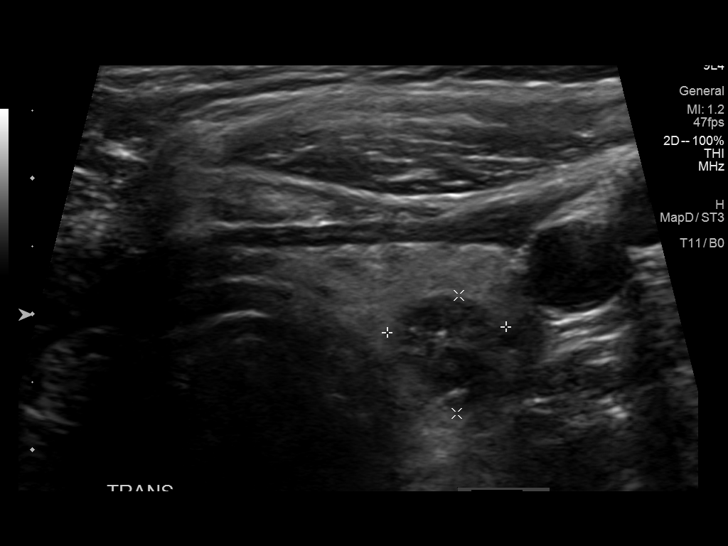
[im 3/10]
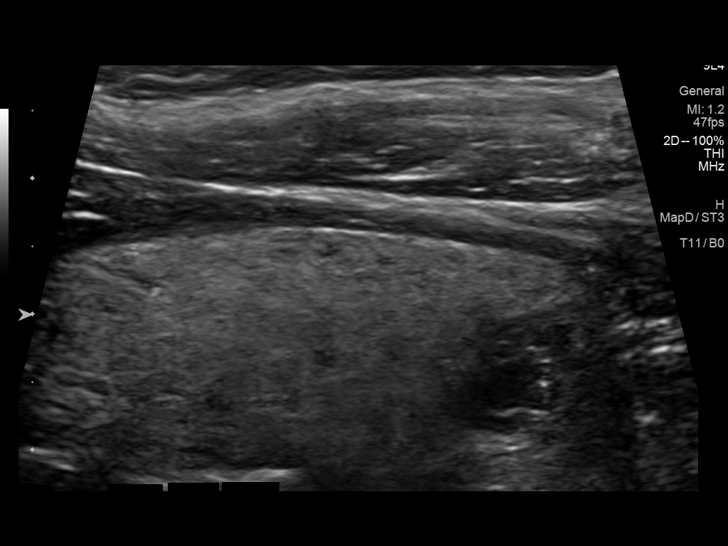
[im 4/10]
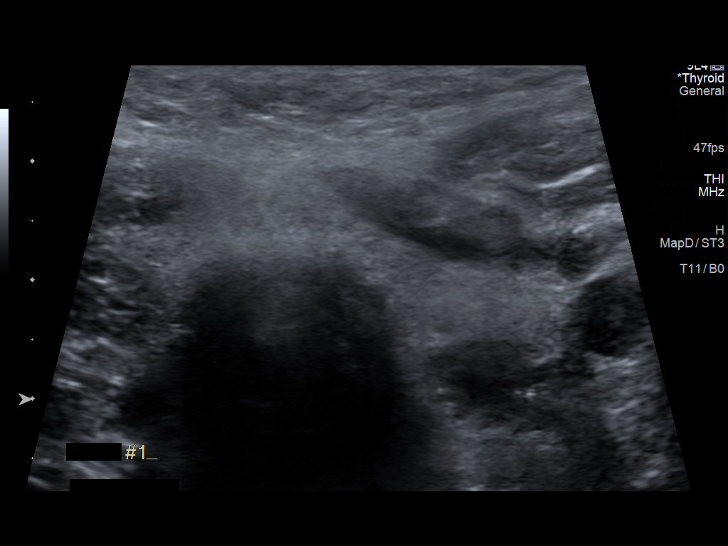
[im 5/10]
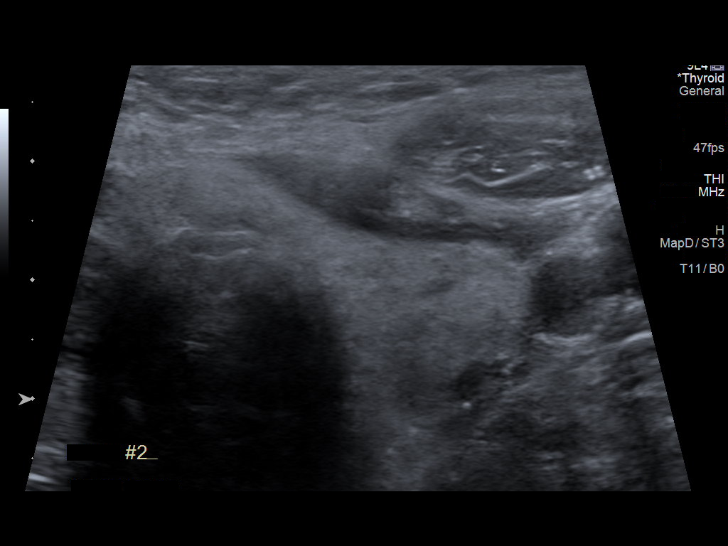
[im 6/10]
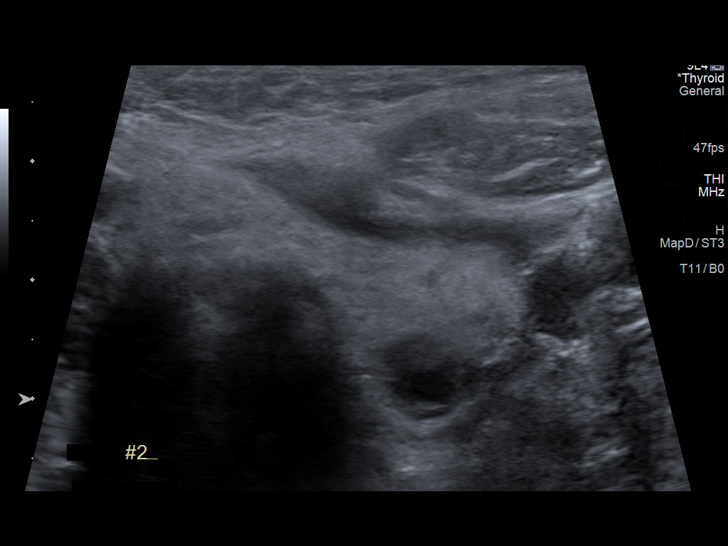
[im 7/10]
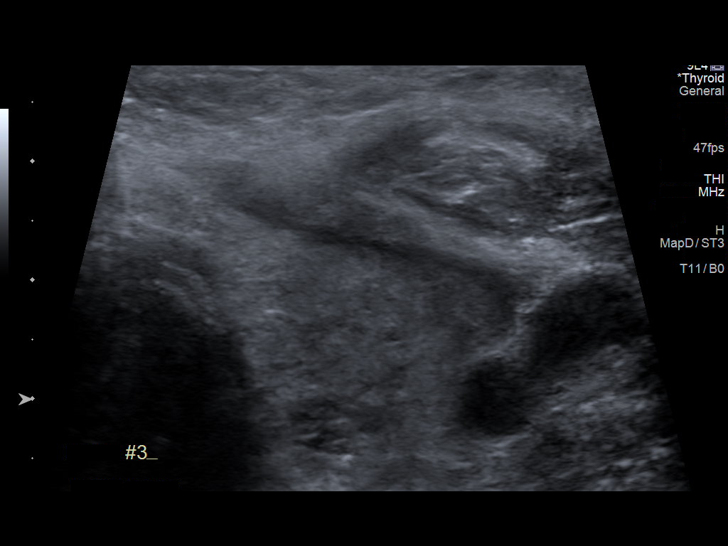
[im 8/10]
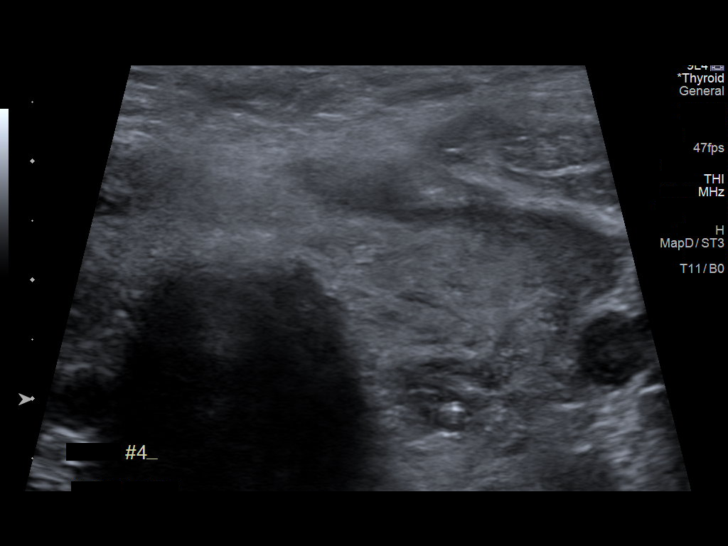
[im 9/10]
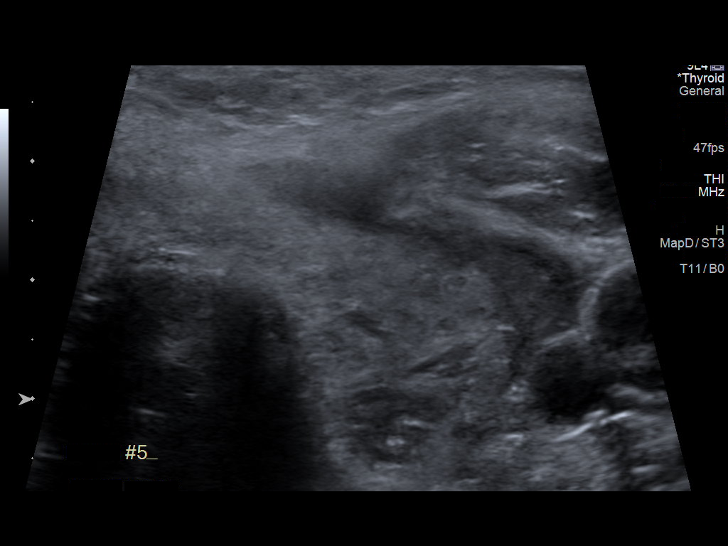
[im 10/10]
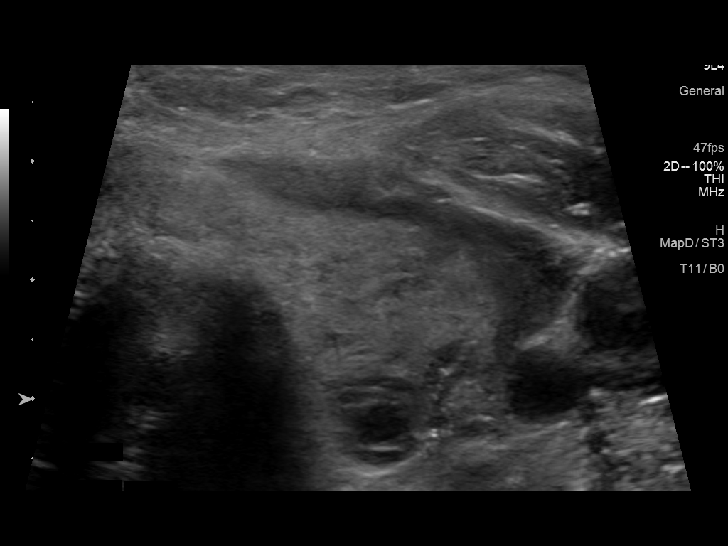

[10 of 10 positions shown; findings below may reference images not displayed]

Pre-procedural ultrasound scanning demonstrated unchanged size and
appearance of the indeterminate nodule within the inferior left
gland

The procedure was planned. The neck was prepped in the usual sterile
fashion, and a sterile drape was applied covering the operative
field. A timeout was performed prior to the initiation of the
procedure. Local anesthesia was provided with 1% lidocaine.

Under direct ultrasound guidance, 5 FNA biopsies were performed of
the left inferior thyroid nodule with a 25 gauge needle. Two samples
were reserved for future Afirma testing. Multiple ultrasound images
were saved for procedural documentation purposes. The samples were
prepared and submitted to pathology.

Limited post procedural scanning was negative for hematoma or
additional complication. Dressings were placed. The patient
tolerated the above procedures procedure well without immediate
postprocedural complication.
FINDINGS: Nodule reference number based on prior diagnostic ultrasound: 3

Maximum size: 0.8 cm

Location: Left; Inferior

ACR TI-RADS risk category: TR5 (>/= 7 points)

Reason for biopsy: meets ACR TI-RADS criteria

Ultrasound imaging confirms appropriate placement of the needles
within the thyroid nodule.
IMPRESSION: Technically successful ultrasound guided fine needle aspiration of
left inferior thyroid nodule.

## 2023-11-26 ENCOUNTER — Telehealth: Payer: Self-pay

## 2023-11-26 ENCOUNTER — Other Ambulatory Visit (HOSPITAL_COMMUNITY): Payer: Self-pay

## 2023-11-26 MED ORDER — TRULICITY 3 MG/0.5ML ~~LOC~~ SOAJ: SUBCUTANEOUS | 4 refills | Status: DC

## 2023-11-26 NOTE — Telephone Encounter (Signed)
 Pharmacy Patient Advocate Encounter   Received notification from Pt Calls Messages that prior authorization for Trulicity  is required/requested.   Insurance verification completed.   The patient is insured through [facility] .   Per test claim: PA required; PA submitted to above mentioned insurance via CoverMyMeds Key/confirmation #/EOC [id number] Status is pending

## 2023-11-26 NOTE — Addendum Note (Signed)
 Addended by: Vernon Goodpasture on: 11/26/2023 09:41 AM   Modules accepted: Orders

## 2023-11-26 NOTE — Telephone Encounter (Signed)
 I have called over to [facility] and it is showing that this patient needs a PA on their end. I have re-sent a new rx for a 30 day supply instead of 90 to see if that helps.

## 2023-12-02 NOTE — Telephone Encounter (Signed)
Pharmacy Patient Advocate Encounter  Received notification from [facility] that Prior Authorization for Trulicity has been APPROVED through 11/24/2026   PA #/Case ID/Reference #: [id number]

## 2024-01-15 ENCOUNTER — Encounter: Payer: Self-pay | Admitting: Internal Medicine

## 2024-01-15 ENCOUNTER — Ambulatory Visit: Payer: BC Managed Care – PPO | Admitting: Internal Medicine

## 2024-01-15 VITALS — BP 120/70 | HR 95 | Ht 63.0 in | Wt 186.0 lb

## 2024-01-15 DIAGNOSIS — Z794 Long term (current) use of insulin: Secondary | ICD-10-CM

## 2024-01-15 DIAGNOSIS — Z7984 Long term (current) use of oral hypoglycemic drugs: Secondary | ICD-10-CM | POA: Diagnosis not present

## 2024-01-15 DIAGNOSIS — E785 Hyperlipidemia, unspecified: Secondary | ICD-10-CM

## 2024-01-15 DIAGNOSIS — E042 Nontoxic multinodular goiter: Secondary | ICD-10-CM | POA: Diagnosis not present

## 2024-01-15 DIAGNOSIS — Z7985 Long-term (current) use of injectable non-insulin antidiabetic drugs: Secondary | ICD-10-CM

## 2024-01-15 DIAGNOSIS — E1142 Type 2 diabetes mellitus with diabetic polyneuropathy: Secondary | ICD-10-CM | POA: Diagnosis not present

## 2024-01-15 NOTE — Patient Instructions (Addendum)
 Please continue: - Metformin 1000 mg 2x a day with meals. - Jardiance 25 mg daily before b'fast - Trulicity 3 mcg weekly - Lantus 30 units at bedtime  Reduce: - Fiasp 1:4 insulin to carb ratio for the day meals, but 1:5 for dinner   Please return in 4 months.

## 2024-01-15 NOTE — Progress Notes (Signed)
 Patient ID: Madison Cowan, female   DOB: 09/05/1961, 63 y.o.   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 Last visit 4 months ago.  Interim hx: No increased urination, nausea, chest pain.   She feels well, without complaints today.  DM2: Reviewed HbA1c levels: Lab Results  Component Value Date   HGBA1C 5.7 (A) 09/24/2023   HGBA1C 5.6 05/21/2023   HGBA1C 5.6 09/24/2022   HGBA1C 6.3 (A) 05/22/2022   HGBA1C 6.8 (A) 01/08/2022   HGBA1C 7.0 (A) 12/28/2020   HGBA1C 6.8 (A) 04/20/2020   HGBA1C 7.3 (A) 12/15/2019   HGBA1C 6.4 (A) 08/09/2019   HGBA1C 6.7 (A) 12/16/2018   HGBA1C 7.2 (A) 08/12/2018   HGBA1C 6.6 (A) 04/10/2018   HGBA1C 6.8 10/28/2017   HGBA1C 6.2 07/29/2017   HGBA1C 6.4 04/23/2017   HGBA1C 9.3 12/12/2016   HGBA1C 11.3 09/10/2016   HGBA1C 13.3 05/07/2016   HGBA1C 7.9 (H) 12/19/2015   HGBA1C 8.0 09/08/2015   01/02/2023: HbA1c 6.0% 11/09/2021: HbA1c 6.8% 08/31/2021: HbA1c 6.3% 04/25/2021: HbA1c 7.2% 01/14/2020: HbA1c 6.7%  She is on: - Metformin 1000 mg 2x a day with meals. - Jardiance 25 mg daily before b'fast - Trulicity 1.5 >> 3 mg weekly - off for 3 weeks >> restarted 6 weeks ago (before this, out for 2 months) -  50 units in a.m. And 70 units at bedtime >> ... Lantus 10 units in a.m. and 30 units at night >> 30 units at night only -  15-25 units before meals - 1:2 insulin to carb ratio for the day meals, but 1:3 >> 1:4 for dinner >> ... 1:4 insulin to carb ratio for the day meals, but 1:5 for dinner >> now 1:2  Pt checks her sugars 4 times a day with her CGM (with receiver):  Previously:  Prev.:  Lowest sugar was 50s >> 60s; she has hypoglycemia awareness in the 90s. Highest sugar was upper 200s >> 200s.  Glucometer: One Touch ultra  Pt's meals are: - Breakfast: protein  shakes (6g carbs); McDonads >> usually skips - Lunch: may skip; salads cheeseburger; school cafeteria food >> may skip - Dinner: fast food; porc chops + collard greens + salad; hamburger; McNuggets - Snacks: sometimes; sweets, nuts, chips She does not have organized mealtimes.  -Mild CKD, last BUN/creatinine:    Lab Results  Component Value Date   BUN 25 12/16/2018   BUN 20 10/15/2017   CREATININE 1.09 (H) 12/16/2018   CREATININE 0.92 10/15/2017   Lab Results  Component Value Date   MICRALBCREAT 14.7 09/24/2023   MICRALBCREAT 1.5 12/16/2018  On Losartan.  -+ HL. Last set of lipids:   Lab Results  Component Value Date   CHOL 98 12/16/2018   HDL 29.30 (L) 12/16/2018   LDLCALC 44 12/16/2018   TRIG 120.0 12/16/2018   CHOLHDL 3 12/16/2018  On Crestor 10.  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 Bx (was on 60 mg Prednisone x3 weeks).  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 She is a glaucoma suspect. 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  - + Numbness, tingling, and also burning in her feet.  She continues on Lyrica,  B complex, alpha lipoic acid.  Last foot exam 05/21/2023.  She also has HTN.  In the past, she lost more than 100 pounds.  Thyroid nodules:  Reviewed her imaging reports and biopsies: Thyroid ultrasound (01/03/2016): Dominant nodule is a right mid lobe hypoechoic nodule of 2.1 x 0.8 x 1.2 cm, and previously measured up to 2.3 cm. There is one left thyroid nodule measuring 6 mm and several other tiny scattered nodules.  She had a benign biopsy of the dominant nodule.  Thyroid ultrasound (04/29/2017): Previously biopsied right nodule is unchanged.  She has 2 additional subcentimeter thyroid nodules, that did not require follow-up.  Also, a hypoechoic lymph node on the right side of the neck, 1.9 cm x 0.5  cm.  Neck CT (11/19/2017): Thyroid: Small thyroid nodules as seen at ultrasound. No worrisome finding by CT. Largest nodule on the right measures 1.7 cm. Lymph nodes: No enlarged or low-density nodes on either side of the neck. Symmetric normal appearing lymph nodes by size criteria.  She had another thyroid U/S 10/31/2020.  After this, she was advised that one of the thyroid nodules need to be biopsied.  I was able to review the report in care everywhere: ISTHMUS:  - Size: 0.3 cm.   RIGHT LOBE:  - Size: 4.6 x 1.9 x 1.6 cm.  - Echogenicity: Heterogeneous.   LEFT LOBE:  - Size: 2.7 x 1.7 x 1.6 cm.  - Echogenicity: Heterogeneous.   NODULES:   Nodule 1:  -- Size: 1.8 x 1 x 1.3 cm (long x AP x trans).  -- Location: Right lower.  -- Composition: solid or almost completely solid (2)  -- Echogenicity: very hypoechoic (3)  -- Shape: wider-than-tall (0)  -- Margins: smooth (0)  -- Echogenic foci: punctate echogenic foci (3)  -- ACR TI-RADS total points and risk category: 8 Points - TR5.   Nodule 2:  -- Size: 0.9 x 0.7 x 1 cm (long x AP x trans).  -- Location: Right lower.  -- Composition: solid or almost completely solid (2)  -- Echogenicity: hypoechoic (2)  -- Shape: wider-than-tall (0)  -- Margins: smooth (0)  -- Echogenic foci: none (0)  -- ACR TI-RADS total points and risk category: 4 Points - TR4.   Nodule 3:  -- Size: 0.7 x 0.5 x 0.7 cm (long x AP x trans).  -- Location: Right lower.  -- Composition: cystic or almost completely cystic (0)  -- Echogenicity: anechoic (0)  -- Shape: wider-than-tall (0)  -- Margins: smooth (0)  -- Echogenic foci: peripheral calcifications (2)  -- ACR TI-RADS total points and risk category: 2 Points - TR2.   Nodule 4:  -- Size: 0.9 x 0.7 x 0.7 cm (long x AP x trans).  -- Location: Left lower.  -- Composition: solid or almost completely solid (2)  -- Echogenicity: hypoechoic (2)  -- Shape: wider-than-tall (0)  -- Margins: smooth (0)  --  Echogenic foci: peripheral calcifications (2)  -- ACR TI-RADS total points and risk category: 6 Points - TR4.   MISCELLANEOUS:  There are other scattered less than 5 mm nodules or cysts throughout both lobes.  Thyroid U/S (01/14/2022): Parenchymal Echotexture: Moderately heterogenous  Isthmus: 0.4 cm  Right lobe: 5.3 x 1.7 x 1.8 cm  Left lobe: 4.2 x 1.5 x 1.6 cm  _________________________________________________   Previously described nodule in the thyroid isthmus is less conspicuous on today's exam.   Nodule labeled 1 (previously 2) is a solid nodule in the right  thyroid lobe that measures 2.2 x 1.1 x 0.9 cm previously 2.1 x 1.3 x 0.8 cm in June 2018. It was previously biopsied, and remains essentially similar in size and morphology.   Nodule labeled 2 (previously 3) is a small subcentimeter nodule more inferior right thyroid lobe that measures 0.8 x 0.6 x 0.5 cm, previously measuring between 0.5-0.7 cm. It was also previously biopsied, and remains essentially similar in size and morphology.   Nodule labeled 3 is a solid hypoechoic with punctate echogenic foci TR 5 nodule in the inferior left thyroid lobe that measures 0.8 x 0.8 x 0.7 cm, previously measuring up to 0.6 cm in June 2018.       IMPRESSION: 1. Multinodular thyroid gland. 2. Nodules labeled 1 and 2 in the right thyroid lobe has been previously biopsied and remain essentially unchanged since 2018. Correlate with biopsy results. 3. Nodule labeled 3 demonstrates slow growth with new microcalcifications that are more evident on today's exam when compared to 2018. Although this nodule could be benign given minimal change in size over 5 year span, biopsy is recommended due its change in appearance as described.   FNA (01/31/2022): Clinical History: Solid hypoechoic with punctate echogenic foci TR-5  nodule in the inferior left thyroid lobe that measure 0.8 x 0.8 x 0.7cm  previously measuring up to 0.6cm in June  2018  Specimen Submitted:  A. THYROID, LEFT INFERIOR, FINE NEEDLE ASPIRATION:   FINAL MICROSCOPIC DIAGNOSIS:  - Consistent with benign follicular nodule (Bethesda category II)   SPECIMEN ADEQUACY:  Satisfactory for evaluation   Pt denies: - feeling nodules in neck - hoarseness - dysphagia - choking  Review latest TFTs:  01/02/2023:  TSH 1.80 04/25/2021: TSH 1.66 01/14/2020: TSH 1.12 Lab Results  Component Value Date   TSH 1.69 12/16/2018   She has thyroid eye disease. She does not have a history of Hashimoto's thyroiditis or Graves' disease.  She retired from a high stress job in 05/2021.  She greatly enjoys retirement.  ROS: + see HPI  I reviewed pt's medications, allergies, PMH, social hx, family hx, and changes were documented in the history of present illness. Otherwise, unchanged from my initial visit note.  Past Medical History:  Diagnosis Date   Allergy    Zyrtec daily.   Anemia    as a child only   Arthritis    DDD lumbar spine   Asthma    Albuterol seasonally.   Chronic kidney disease    III A- CFR 53   Diabetes mellitus without complication (HCC)    Glaucoma    Heart murmur    since birth   Hyperlipidemia    Hypertension    Neuromuscular disorder (HCC)    R third nerve palsy   OSA (obstructive sleep apnea) 11/11/2006   no CPAP   Seizures (HCC)    Thyroid disease    Thyroid nodules s/p needle biopsy negative.    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  Loves work.      Tobacco: 1/2 pdd x 20 years.      Alcohol:  None      Drugs: none       Education: [employer].       Exercise: No.      Seatbelt: 100%      Guns: unloaded.      Sexually active: none; total partners = 5.  Last STD screen 2006.     Current Outpatient Medications on File Prior to Visit  Medication Sig Dispense Refill   acetaZOLAMIDE (DIAMOX) 250 MG tablet Take by mouth. 2 tabs BID     amLODipine (NORVASC) 5 MG tablet Take 5 mg by mouth daily.     BD INSULIN SYRINGE U/F 31G X 5/16" 1 ML MISC USE AS DIRECTED TO INJECT INSULIN TWO TIMES DAILY 100 each 0   Cholecalciferol (VITAMIN D3 PO) Take by mouth. 5000 IU daily     Continuous Blood Gluc Receiver (FREESTYLE LIBRE 2 READER) DEVI USE DAILY TO MONITOR BLOOD GLUCOSE 6 each 3   Continuous Glucose Sensor (FREESTYLE LIBRE 2 SENSOR) MISC APPLY ONE SENSOR EVERY 14 DAYS TO MONITOR BLOOD GLUCOSE LEVELS 6 each 3   Dulaglutide (TRULICITY) 3 MG/0.5ML SOAJ INJECT 3 MG UNDER THE SKIN ONCE WEEKLY DxCode:E11.42 2 mL 4   empagliflozin (JARDIANCE) 25 MG TABS tablet Take 1 tablet (25 mg total) by mouth daily. 90 tablet 3   hydrochlorothiazide (HYDRODIURIL) 12.5 MG tablet Take 12.5 mg by mouth daily.     insulin aspart (FIASP FLEXTOUCH) 100 UNIT/ML FlexTouch Pen Inject 10-15 Units into the skin 2 (two) times daily before a meal.     insulin glargine (LANTUS SOLOSTAR) 100 UNIT/ML Solostar Pen INJECT 30 UNITS UNDER THE SKIN TWO TIMES A DAY 60 mL 1   Insulin Pen Needle (BD PEN NEEDLE NANO 2ND GEN) 32G X 4 MM MISC USE 3 TIMES A DAY FOR INSULIN 300 each 3   levocetirizine (XYZAL) 5 MG tablet Take 5 mg by mouth every evening.     losartan  (COZAAR) 100 MG tablet Take 100 mg by mouth daily.     metFORMIN (GLUCOPHAGE) 1000 MG tablet Take 1 tablet (1,000 mg total) by mouth 2 (two) times daily. 180 tablet 3   Multiple Vitamins-Minerals (MULTIVITAMIN WITH MINERALS) tablet Take 1 tablet by mouth daily.     ONETOUCH ULTRA test strip TEST 3-4 TIMES DAILY 300 strip 2   pregabalin (LYRICA) 25 MG capsule Take 1-2 capsules (25-50 mg total) by mouth at bedtime. 60 capsule 5   rosuvastatin (CRESTOR) 10 MG tablet TAKE ONE TABLET BY MOUTH DAILY 30 tablet 0   SYMBICORT 160-4.5 MCG/ACT inhaler Inhale into the lungs.     Syringe/Needle, Disp, (SYRINGE 3CC/27GX1-1/4") 27G X 1-1/4" 3 ML MISC Use as instructed to inject insulin 100 each 0   vitamin B-12 (CYANOCOBALAMIN) 500 MCG tablet Take 500 mcg by mouth daily.     No current facility-administered medications on file prior to visit.  Allergies  Allergen Reactions   Covid-19 Mrna Vaccine Proofreader) [Covid-19 Mrna Vacc (Moderna)] Other (See Comments)   Family History  Problem Relation Age of Onset   Diabetes Mother    Hypertension Mother    Ovarian cancer Mother 58   Breast cancer Mother 53   Cancer Mother 35       ovarian cancer age 11; breast cancer age 11   Diabetes Father    Heart disease Father 51       CAD/CABG; AAA; CHF   Hyperlipidemia Father    Hypertension Father    Prostate cancer Father        dx. 82-83   Colon polyps Father        section of colon removed; unsure of #   Skin cancer Sister    Heart disease Sister 17       3-vessel CAD s/p 1 stenting; CABG   Diabetes Sister    Hyperlipidemia Sister    Hypertension Sister    Heart disease Brother        Died of CHF at age 55.   Hyperlipidemia Brother    Hypertension Brother    Congestive Heart Failure Brother    Esophageal cancer Brother    Cancer Brother 87       GI- esophageal; smoker   Heart disease Brother        MIs in 38s and CABG.   Diabetes Brother    Hypertension Brother    Skin cancer Maternal Aunt         multiple - unknown type; dx. 37s   Pancreatic cancer Maternal Uncle        dx. 70s   Stroke Maternal Grandmother    Hypertension Maternal Grandmother    Skin cancer Maternal Grandfather        multiple - unknown types; dx. 81s   Cancer Maternal Grandfather    Heart Problems Paternal Grandfather    Crohn's disease Neg Hx    Rectal cancer Neg Hx    Stomach cancer Neg Hx    Colon cancer Neg Hx    PE: BP 120/70   Pulse 95   Ht 5\' 3"  (1.6 m)   Wt 186 lb (84.4 kg)   SpO2 97%   BMI 32.95 kg/m  Wt Readings from Last 3 Encounters:  01/15/24 186 lb (84.4 kg)  09/24/23 187 lb 6.4 oz (85 kg)  05/21/23 190 lb 6.4 oz (86.4 kg)   Constitutional: overweight, in NAD Eyes:  EOMI, no exophthalmos ENT: no thyromegaly, no cervical lymphadenopathy Cardiovascular: tachycardia, RR, No MRG Respiratory: CTA B Musculoskeletal: no deformities Skin: no rashes Neurological: no tremor with outstretched hands  ASSESSMENT: 1. DM2, insulin-dependent, now controlled, with complications - Peripheral neuropathy.  2. MNG  3. HL  PLAN:  1. Patient with longstanding, well-controlled, type 2 diabetes, on a complex medication regimen with metformin, SGLT2 inhibitor, basal-bolus insulin and weekly GLP-1 receptor agonist, restarted at last visit.  At that time, we stopped her basal insulin morning dose and also reduced her mealtime insulin.  She previously had problems obtaining Trulicity due to insurance coverage but she was doing excellent on it in the past so at last visit we called the pharmacy and it appears that they had another insurance for her on file.  Since last visit, a PA for Trulicity was approved. -Latest HbA1c was still at goal, only slightly higher, at 5.7%. CGM interpretation -At today's visit, we reviewed her CGM downloads: It appears that 96% of  values are in target range (goal >70%), while 3% are higher than 180 (goal <25%), and 1% are lower than 70 (goal <4%).  The calculated average  blood sugar is 122.  The projected HbA1c for the next 3 months (GMI) is 6.2%. -Reviewing the CGM trends, sugars appear to be fluctuating within the target range with only occasional hyperglycemic spikes, but these are mild and sparse.  However, sugars are higher overnight usually and upon questioning, she may have late dinners.  She takes the [place] usually at the beginning of the meal but sometimes during the meal.  I advised her that if she forgets to take it and plans to take it after the meal, she may need to take only half of the amount. -She was out of Trulicity again, for 2 months since last visit (she strengthened her ICR's during this time), and restarted it 6 weeks ago.  Reviewing the CGM trends, sugars improved after starting it.  Will continue the same dose for now.  She is still using a higher dose of Fiasp and we discussed about going back to the dose that she was using while on Trulicity before. - I suggested to:  Patient Instructions  Please continue: - Metformin 1000 mg 2x a day with meals. - Jardiance 25 mg daily before b'fast - Trulicity 3 mcg weekly - Lantus 30 units at bedtime  Reduce: - Fiasp 1:4 insulin to carb ratio for the day meals, but 1:5 for dinner   Please return in 4 months.  - we checked her HbA1c: 6.0% (higher) - advised to check sugars at different times of the day - 4x a day, rotating check times - advised for yearly eye exams >> she is UTD - she has another appointment with PCP later today - we may need annual labs at next visit if not checked by PCP before then. - return to clinic in 3-4 months  2. MNG -Reviewed her thyroid ultrasound report from 2019: Stable right dominant nodule, which was previously biopsied with benign results. She had a neck CT afterwards which did not show any worrisome nodules in her neck.. She saw her PCP in 10/2020, and at that time the nodules were felt to be enlarged.  Another thyroid ultrasound was checked (I do not have the  images, only the report) and this showed mostly subcentimeter nodules while the dominant right nodule was 1.8 cm in the largest dimension.  This nodule was biopsied with benign results in the past and, per review of records, this is actually decreased in size on the latest ultrasound.  Therefore, I did not suggest any biopsy at that time. Latest thyroid ultrasound is from 01/2022:  2 of the dominant nodules were stable (these were previously biopsied with benign results), but the left inferior nodule was slightly larger and had punctate echogenic foci.  We biopsied the nodule with benign results -No neck compression symptoms -Latest TSH was reviewed from 04/2023-normal -Will continue to keep an eye on the thyroid nodules  3. HL - Reviewed latest lipid panel from 04/2023: Fractions at goal with the exception of a slightly low HDL - Continues the statin (Crestor 10 mg daily) without side effects.  Carlus Pavlov, MD PhD [facility]

## 2024-01-16 LAB — LAB REPORT - SCANNED: EGFR: 59

## 2024-01-26 ENCOUNTER — Encounter: Payer: Self-pay | Admitting: Internal Medicine

## 2024-01-26 NOTE — Progress Notes (Unsigned)
 01/16/2024 Hal Hope): ACR 33

## 2024-03-17 ENCOUNTER — Other Ambulatory Visit: Payer: Self-pay | Admitting: Internal Medicine

## 2024-04-11 ENCOUNTER — Other Ambulatory Visit: Payer: Self-pay | Admitting: Internal Medicine

## 2024-04-11 DIAGNOSIS — E1142 Type 2 diabetes mellitus with diabetic polyneuropathy: Secondary | ICD-10-CM

## 2024-05-24 ENCOUNTER — Other Ambulatory Visit (HOSPITAL_COMMUNITY): Payer: Self-pay

## 2024-05-24 ENCOUNTER — Telehealth: Payer: Self-pay

## 2024-05-24 ENCOUNTER — Encounter: Payer: Self-pay | Admitting: Internal Medicine

## 2024-05-24 ENCOUNTER — Ambulatory Visit (INDEPENDENT_AMBULATORY_CARE_PROVIDER_SITE_OTHER): Admitting: Internal Medicine

## 2024-05-24 VITALS — BP 120/74 | HR 84 | Ht 63.0 in | Wt 181.4 lb

## 2024-05-24 DIAGNOSIS — E785 Hyperlipidemia, unspecified: Secondary | ICD-10-CM

## 2024-05-24 DIAGNOSIS — Z7985 Long-term (current) use of injectable non-insulin antidiabetic drugs: Secondary | ICD-10-CM

## 2024-05-24 DIAGNOSIS — E1142 Type 2 diabetes mellitus with diabetic polyneuropathy: Secondary | ICD-10-CM

## 2024-05-24 DIAGNOSIS — E042 Nontoxic multinodular goiter: Secondary | ICD-10-CM | POA: Diagnosis not present

## 2024-05-24 DIAGNOSIS — Z7984 Long term (current) use of oral hypoglycemic drugs: Secondary | ICD-10-CM

## 2024-05-24 DIAGNOSIS — Z794 Long term (current) use of insulin: Secondary | ICD-10-CM

## 2024-05-24 LAB — POCT GLYCOSYLATED HEMOGLOBIN (HGB A1C): Hemoglobin A1C: 5.5 % (ref 4.0–5.6)

## 2024-05-24 MED ORDER — TRULICITY 3 MG/0.5ML ~~LOC~~ SOAJ: SUBCUTANEOUS | 4 refills | Status: DC

## 2024-05-24 MED ORDER — FIASP FLEXTOUCH 100 UNIT/ML ~~LOC~~ SOPN: 0.0000 [IU] | PEN_INJECTOR | Freq: Two times a day (BID) | SUBCUTANEOUS | Status: DC

## 2024-05-24 NOTE — Patient Instructions (Addendum)
 Please continue: - Metformin  1000 mg 2x a day with meals. - Jardiance  25 mg daily before b'fast - Trulicity  3 mcg weekly  Decrease: - Lantus  20 units in am  Try without Fiasp  - only use 5-7 units before a large meal.   Please return in 4 months.

## 2024-05-24 NOTE — Progress Notes (Signed)
 Patient ID: Madison Cowan, female   DOB: 1961-02-03, 63 y.o.   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 Last visit 4 months ago.  Interim hx: No increased urination, nausea, chest pain.   She feels well, without complaints today.   DM2: Reviewed HbA1c levels: 01/15/2024: HbA1c 6.0% Lab Results  Component Value Date   HGBA1C 5.7 (A) 09/24/2023   HGBA1C 5.6 05/21/2023   HGBA1C 5.6 09/24/2022   HGBA1C 6.3 (A) 05/22/2022   HGBA1C 6.8 (A) 01/08/2022   HGBA1C 7.0 (A) 12/28/2020   HGBA1C 6.8 (A) 04/20/2020   HGBA1C 7.3 (A) 12/15/2019   HGBA1C 6.4 (A) 08/09/2019   HGBA1C 6.7 (A) 12/16/2018   HGBA1C 7.2 (A) 08/12/2018   HGBA1C 6.6 (A) 04/10/2018   HGBA1C 6.8 10/28/2017   HGBA1C 6.2 07/29/2017   HGBA1C 6.4 04/23/2017   HGBA1C 9.3 12/12/2016   HGBA1C 11.3 09/10/2016   HGBA1C 13.3 05/07/2016   HGBA1C 7.9 (H) 12/19/2015   HGBA1C 8.0 09/08/2015   01/02/2023: HbA1c 6.0% 11/09/2021: HbA1c 6.8% 08/31/2021: HbA1c 6.3% 04/25/2021: HbA1c 7.2% 01/14/2020: HbA1c 6.7%  She is on: - Metformin  1000 mg 2x a day with meals. - Jardiance  25 mg daily before b'fast - Trulicity  1.5 >> 3 mg weekly - off for 3 weeks >> restarted 6 weeks ago (before this, out for 2 months) -  50 units in a.m. And 70 units at bedtime >> ... Lantus  10 units in a.m. and 30 units at night >> 30 units at night only >> in am -  15-25 units before meals - 1:2 insulin  to carb ratio for the day meals, but 1:3 >> 1:4 for dinner >> ... 1:4 insulin  to carb ratio for the day meals, but 1:5 for dinner >> 1:2 >> 1:4 during the day and 1:5 for dinner  Pt checks her sugars 4 times a day with her CGM (with receiver):  Previously:  Previously:   Lowest sugar was 50s >> 60s > 50s; she has hypoglycemia awareness in the 90s. Highest sugar was upper 200s >>  200s >> 200.  Glucometer: One Touch ultra  Pt's meals are: - Breakfast: protein shakes (6g carbs); McDonads >> usually skips - Lunch: may skip; salads cheeseburger; school cafeteria food >> may skip - Dinner: fast food; porc chops + collard greens + salad; hamburger; McNuggets - Snacks: sometimes; sweets, nuts, chips She does not have organized mealtimes.  -Mild CKD, last BUN/creatinine:  01/15/2024: 20/1.05, GFR 59, glucose 76, ACR 33 Lab Results  Component Value Date   BUN 25 12/16/2018   BUN 20 10/15/2017   CREATININE 1.09 (H) 12/16/2018   CREATININE 0.92 10/15/2017   No results found for: MICRALBCREAT On Losartan .  -+ HL. Last set of lipids: 01/15/2024: 108/118/43/45 Lab Results  Component Value Date   CHOL 98 12/16/2018   HDL 29.30 (L) 12/16/2018   LDLCALC 44 12/16/2018   TRIG 120.0 12/16/2018   CHOLHDL 3 12/16/2018  On Crestor  10.  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 Bx (was on 60 mg Prednisone x3 weeks).  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 She is a glaucoma suspect. 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  - +  Numbness, tingling, and also burning in her feet.  She continues on Lyrica , B complex, alpha lipoic acid.  Last foot exam 05/21/2023.  She also has HTN.  In the past, she lost more than 100 pounds.  Thyroid  nodules:  Reviewed her imaging reports and biopsies: Thyroid  ultrasound (01/03/2016): Dominant nodule is a right mid lobe hypoechoic nodule of 2.1 x 0.8 x 1.2 cm, and previously measured up to 2.3 cm. There is one left thyroid  nodule measuring 6 mm and several other tiny scattered nodules.  She had a benign biopsy of the dominant nodule.  Thyroid  ultrasound (04/29/2017): Previously biopsied right nodule is unchanged.  She has 2 additional subcentimeter thyroid  nodules, that did not require  follow-up.  Also, a hypoechoic lymph node on the right side of the neck, 1.9 cm x 0.5 cm.  Neck CT (11/19/2017): Thyroid : Small thyroid  nodules as seen at ultrasound. No worrisome finding by CT. Largest nodule on the right measures 1.7 cm. Lymph nodes: No enlarged or low-density nodes on either side of the neck. Symmetric normal appearing lymph nodes by size criteria.  She had another thyroid  U/S 10/31/2020.  After this, she was advised that one of the thyroid  nodules need to be biopsied.  I was able to review the report in care everywhere: ISTHMUS:  - Size: 0.3 cm.   RIGHT LOBE:  - Size: 4.6 x 1.9 x 1.6 cm.  - Echogenicity: Heterogeneous.   LEFT LOBE:  - Size: 2.7 x 1.7 x 1.6 cm.  - Echogenicity: Heterogeneous.   NODULES:   Nodule 1:  -- Size: 1.8 x 1 x 1.3 cm (long x AP x trans).  -- Location: Right lower.  -- Composition: solid or almost completely solid (2)  -- Echogenicity: very hypoechoic (3)  -- Shape: wider-than-tall (0)  -- Margins: smooth (0)  -- Echogenic foci: punctate echogenic foci (3)  -- ACR TI-RADS total points and risk category: 8 Points - TR5.   Nodule 2:  -- Size: 0.9 x 0.7 x 1 cm (long x AP x trans).  -- Location: Right lower.  -- Composition: solid or almost completely solid (2)  -- Echogenicity: hypoechoic (2)  -- Shape: wider-than-tall (0)  -- Margins: smooth (0)  -- Echogenic foci: none (0)  -- ACR TI-RADS total points and risk category: 4 Points - TR4.   Nodule 3:  -- Size: 0.7 x 0.5 x 0.7 cm (long x AP x trans).  -- Location: Right lower.  -- Composition: cystic or almost completely cystic (0)  -- Echogenicity: anechoic (0)  -- Shape: wider-than-tall (0)  -- Margins: smooth (0)  -- Echogenic foci: peripheral calcifications (2)  -- ACR TI-RADS total points and risk category: 2 Points - TR2.   Nodule 4:  -- Size: 0.9 x 0.7 x 0.7 cm (long x AP x trans).  -- Location: Left lower.  -- Composition: solid or almost completely solid (2)  --  Echogenicity: hypoechoic (2)  -- Shape: wider-than-tall (0)  -- Margins: smooth (0)  -- Echogenic foci: peripheral calcifications (2)  -- ACR TI-RADS total points and risk category: 6 Points - TR4.   MISCELLANEOUS:  There are other scattered less than 5 mm nodules or cysts throughout both lobes.  Thyroid  U/S (01/14/2022): Parenchymal Echotexture: Moderately heterogenous  Isthmus: 0.4 cm  Right lobe: 5.3 x 1.7 x 1.8 cm  Left lobe: 4.2 x 1.5 x 1.6 cm  _________________________________________________   Previously described nodule in the thyroid  isthmus is less conspicuous on today's exam.  Nodule labeled 1 (previously 2) is a solid nodule in the right thyroid  lobe that measures 2.2 x 1.1 x 0.9 cm previously 2.1 x 1.3 x 0.8 cm in June 2018. It was previously biopsied, and remains essentially similar in size and morphology.   Nodule labeled 2 (previously 3) is a small subcentimeter nodule more inferior right thyroid  lobe that measures 0.8 x 0.6 x 0.5 cm, previously measuring between 0.5-0.7 cm. It was also previously biopsied, and remains essentially similar in size and morphology.   Nodule labeled 3 is a solid hypoechoic with punctate echogenic foci TR 5 nodule in the inferior left thyroid  lobe that measures 0.8 x 0.8 x 0.7 cm, previously measuring up to 0.6 cm in June 2018.       IMPRESSION: 1. Multinodular thyroid  gland. 2. Nodules labeled 1 and 2 in the right thyroid  lobe has been previously biopsied and remain essentially unchanged since 2018. Correlate with biopsy results. 3. Nodule labeled 3 demonstrates slow growth with new microcalcifications that are more evident on today's exam when compared to 2018. Although this nodule could be benign given minimal change in size over 5 year span, biopsy is recommended due its change in appearance as described.   FNA (01/31/2022): Clinical History: Solid hypoechoic with punctate echogenic foci TR-5  nodule in the inferior  left thyroid  lobe that measure 0.8 x 0.8 x 0.7cm  previously measuring up to 0.6cm in June 2018  Specimen Submitted:  A. THYROID , LEFT INFERIOR, FINE NEEDLE ASPIRATION:   FINAL MICROSCOPIC DIAGNOSIS:  - Consistent with benign follicular nodule (Bethesda category II)   SPECIMEN ADEQUACY:  Satisfactory for evaluation   Pt denies: - feeling nodules in neck - hoarseness - dysphagia - choking  Review latest TFTs: 01/15/2024: TSH 1.10 Lab Results  Component Value Date   TSH 1.69 12/16/2018   She has thyroid  eye disease. She does not have a history of Hashimoto's thyroiditis or Graves' disease.  She retired from a high stress job in 05/2021.  She greatly enjoys retirement.  ROS: + see HPI  I reviewed pt's medications, allergies, PMH, social hx, family hx, and changes were documented in the history of present illness. Otherwise, unchanged from my initial visit note.  Past Medical History:  Diagnosis Date   Allergy    Zyrtec daily.   Anemia    as a child only   Arthritis    DDD lumbar spine   Asthma    Albuterol  seasonally.   Chronic kidney disease    III A- CFR 53   Diabetes mellitus without complication (HCC)    Glaucoma    Heart murmur    since birth   Hyperlipidemia    Hypertension    Neuromuscular disorder (HCC)    R third nerve palsy   OSA (obstructive sleep apnea) 11/11/2006   no CPAP   Seizures (HCC)    Thyroid  disease    Thyroid  nodules s/p needle biopsy negative.    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  Loves work.      Tobacco: 1/2 pdd x 20 years.      Alcohol:  None      Drugs: none       Education: [employer].       Exercise: No.      Seatbelt: 100%      Guns: unloaded.      Sexually active: none; total partners = 5.  Last STD screen 2006.     Current Outpatient Medications on File Prior to Visit  Medication Sig Dispense Refill   acetaZOLAMIDE (DIAMOX) 250 MG tablet Take by mouth. 2 tabs BID     amLODipine (NORVASC) 5 MG tablet Take 5 mg by mouth daily.     BD INSULIN  SYRINGE U/F 31G X 5/16 1 ML MISC USE AS DIRECTED TO INJECT INSULIN  TWO TIMES DAILY 100 each 0   Cholecalciferol (VITAMIN D3 PO) Take by mouth. 5000 IU daily     Continuous Blood Gluc Receiver (FREESTYLE LIBRE 2 READER) DEVI USE DAILY TO MONITOR BLOOD GLUCOSE 6 each 3   Continuous Glucose Sensor (FREESTYLE LIBRE 2 SENSOR) MISC APPLY 1 SENSOR TO SKIN FOR CONTINUOUS BLOOD SUGAR MONITORING, REPLACE EVERY 14 DAYS 6 each 3   Dulaglutide  (TRULICITY ) 3 MG/0.5ML SOAJ INJECT 3 MG UNDER THE SKIN ONCE WEEKLY 2 mL 4   empagliflozin  (JARDIANCE ) 25 MG TABS tablet Take 1 tablet (25 mg total) by mouth daily. 90 tablet 3   hydrochlorothiazide (HYDRODIURIL) 12.5 MG tablet Take 12.5 mg by mouth daily.     insulin  aspart (FIASP  FLEXTOUCH) 100 UNIT/ML FlexTouch Pen Inject 10-15 Units into the skin 2 (two) times daily before a meal.     insulin  glargine (LANTUS  SOLOSTAR) 100 UNIT/ML Solostar Pen INJECT 30 UNITS UNDER THE SKIN TWO TIMES A DAY 60 mL 1   Insulin  Pen Needle (BD PEN NEEDLE NANO 2ND GEN) 32G X 4 MM MISC USE 3 TIMES A DAY FOR INSULIN  300 each 3   levocetirizine (XYZAL) 5 MG  tablet Take 5 mg by mouth every evening.     losartan  (COZAAR ) 100 MG tablet Take 100 mg by mouth daily.     metFORMIN  (GLUCOPHAGE ) 1000 MG tablet Take 1 tablet (1,000 mg total) by mouth 2 (two) times daily. 180 tablet 3   Multiple Vitamins-Minerals (MULTIVITAMIN WITH MINERALS) tablet Take 1 tablet by mouth daily.     ONETOUCH ULTRA test strip TEST 3-4 TIMES DAILY 300 strip 2   pregabalin  (LYRICA ) 25 MG capsule Take 1-2 capsules (25-50 mg total) by mouth at bedtime. 60 capsule 5   rosuvastatin  (CRESTOR ) 10 MG tablet TAKE ONE TABLET BY MOUTH DAILY 30 tablet 0   SYMBICORT 160-4.5 MCG/ACT inhaler Inhale into the lungs.     Syringe/Needle, Disp, (SYRINGE 3CC/27GX1-1/4) 27G X 1-1/4 3 ML MISC Use as instructed to inject insulin  100 each 0   vitamin B-12 (CYANOCOBALAMIN) 500 MCG tablet Take 500 mcg by mouth daily.     No current  facility-administered medications on file prior to visit.   Allergies  Allergen Reactions   Covid-19 Mrna Vaccine Proofreader) [Covid-19 Mrna Vacc (Moderna)] Other (See Comments)   Family History  Problem Relation Age of Onset   Diabetes Mother    Hypertension Mother    Ovarian cancer Mother 68   Breast cancer Mother 41   Cancer Mother 40       ovarian cancer age 11; breast cancer age 50   Diabetes Father    Heart disease Father 67       CAD/CABG; AAA; CHF   Hyperlipidemia Father    Hypertension Father    Prostate cancer Father        dx. 82-83   Colon polyps Father        section of colon removed; unsure of #   Skin cancer Sister    Heart disease Sister 75       3-vessel CAD s/p 1 stenting; CABG   Diabetes Sister    Hyperlipidemia Sister    Hypertension Sister    Heart disease Brother        Died of CHF at age 42.   Hyperlipidemia Brother    Hypertension Brother    Congestive Heart Failure Brother    Esophageal cancer Brother    Cancer Brother 22       GI- esophageal; smoker   Heart disease Brother        MIs in 34s and CABG.   Diabetes Brother     Hypertension Brother    Skin cancer Maternal Aunt        multiple - unknown type; dx. 62s   Pancreatic cancer Maternal Uncle        dx. 70s   Stroke Maternal Grandmother    Hypertension Maternal Grandmother    Skin cancer Maternal Grandfather        multiple - unknown types; dx. 24s   Cancer Maternal Grandfather    Heart Problems Paternal Grandfather    Crohn's disease Neg Hx    Rectal cancer Neg Hx    Stomach cancer Neg Hx    Colon cancer Neg Hx    PE: BP 120/74   Pulse 84   Ht 5' 3 (1.6 m)   Wt 181 lb 6.4 oz (82.3 kg)   SpO2 99%   BMI 32.13 kg/m  Wt Readings from Last 3 Encounters:  05/24/24 181 lb 6.4 oz (82.3 kg)  01/15/24 186 lb (84.4 kg)  09/24/23 187 lb 6.4 oz (85 kg)   Constitutional: overweight, in NAD Eyes:  EOMI, no exophthalmos ENT: no thyromegaly, no cervical lymphadenopathy Cardiovascular: RRR, No MRG Respiratory: CTA B Musculoskeletal: no deformities Skin: no rashes Neurological: no tremor with outstretched hands Diabetic Foot Exam - Simple   Simple Foot Form Diabetic Foot exam was performed with the following findings: Yes 05/24/2024 11:41 AM  Visual Inspection No deformities, no ulcerations, no other skin breakdown bilaterally: Yes Sensation Testing Intact to touch and monofilament testing bilaterally: Yes Pulse Check Posterior Tibialis and Dorsalis pulse intact bilaterally: Yes Comments    ASSESSMENT: 1. DM2, insulin -dependent, now controlled, with complications - Peripheral neuropathy.  2. MNG  3. HL  PLAN:  1. Patient with longstanding, well-controlled, type 2 diabetes, on a complex medication regimen with metformin , SGLT2 inhibitor, weekly GLP-1 receptor agonist and basal-bolus insulin , with good control.  At last visit, HbA1c was only slightly higher, but still at goal, at 6.0%.  She was out of Trulicity  for 2 months and just  restarted it 6 weeks prior to the appointment.  She strengthened her insulin  to carb ratios while off Trulicity   and I advised her to relax them again but otherwise I did not suggest any other changes in her regimen.  We did discuss that if she had to take Fiasp  after meal, to only take half of the amount. CGM interpretation -At today's visit, we reviewed her CGM downloads: It appears that 94% of values are in target range (goal >70%), while 0% are higher than 180 (goal <25%), and 6% are lower than 70 (goal <4%).  The calculated average blood sugar is 100.  The projected HbA1c for the next 3 months (GMI) is 5.7%. -Reviewing the CGM trends, sugars are low normal throughout the day, with frequent blood sugars lower than 70 upon questioning, she moved her Lantus  from evening to morning but she did not decrease the dose-will do so now.  Also, sugars after meal increase only slightly so we discussed about trying to also eliminate Fiasp  or at least only use it with larger meals.  Will continue the rest of the regimen. - I suggested to:  Patient Instructions  Please continue: - Metformin  1000 mg 2x a day with meals. - Jardiance  25 mg daily before b'fast - Trulicity  3 mcg weekly  Decrease: - Lantus  20 units in am  Try without Fiasp  - only use 5-7 units before a large meal.   Please return in 4 months.  - we checked her HbA1c: 5.5% (lower) - advised to check sugars at different times of the day - 4x a day, rotating check times - advised for yearly eye exams >> she is UTD - return to clinic in 4 months  2. MNG -Reviewed her thyroid  ultrasound report from 2019: Stable right dominant nodule, which was previously biopsied with benign results. She had a neck CT afterwards which did not show any worrisome nodules in her neck.. She saw her PCP in 10/2020, and at that time the nodules were felt to be enlarged.  Another thyroid  ultrasound was checked (I do not have the images, only the report) and this showed mostly subcentimeter nodules while the dominant right nodule was 1.8 cm in the largest dimension.  This nodule  was biopsied with benign results in the past and, per review of records, this is actually decreased in size on the latest ultrasound.  Therefore, I did not suggest any biopsy at that time. Latest thyroid  ultrasound is from 01/2022:  2 of the dominant nodules were stable (these were previously biopsied with benign results), but the left inferior nodule was slightly larger and had punctate echogenic foci.  We biopsied the nodule with benign results - She denies neck compression symptoms - TSH was reviewed from 01/15/2024: 1.10, normal - Will continue to keep an eye on the thyroid  nodules  3. HL - Reviewed latest lipid panel from 01/15/2024: 108/118/43/45 -fractions at goal with the exception of a slightly low HDL - Continues Crestor  10 mg daily without side effects.  Lela Fendt, MD PhD [facility] Endocrinology

## 2024-05-24 NOTE — Telephone Encounter (Signed)
 Patient needs a PA for Trulicity .  Her new insurance cards have been uploaded.

## 2024-05-24 NOTE — Telephone Encounter (Signed)
 Pharmacy Patient Advocate Encounter   Received notification from Pt Calls Messages that prior authorization for Trulicity  3mg  is required/requested.   Insurance verification completed.   The patient is insured through [facility] ADVANTAGE/RX ADVANCE .   Per test claim: Refill too soon. PA is not needed at this time. Medication was filled 05/12/24. Next eligible fill date is 06/02/24.

## 2024-06-03 ENCOUNTER — Other Ambulatory Visit: Payer: Self-pay | Admitting: Internal Medicine

## 2024-06-04 NOTE — Telephone Encounter (Signed)
 Additional information has been requested from the patient's insurance in order to proceed with the prior authorization request. Requested information has been sent, or form has been filled out and faxed back

## 2024-09-15 LAB — LAB REPORT - SCANNED
A1c: 6
EGFR: 72

## 2024-09-16 ENCOUNTER — Other Ambulatory Visit: Payer: Self-pay | Admitting: Internal Medicine

## 2024-09-16 NOTE — Telephone Encounter (Signed)
 Refill request complete

## 2024-09-24 ENCOUNTER — Ambulatory Visit: Admitting: Internal Medicine

## 2024-09-24 ENCOUNTER — Encounter: Payer: Self-pay | Admitting: Internal Medicine

## 2024-09-24 VITALS — BP 120/70 | HR 81 | Ht 63.0 in | Wt 180.6 lb

## 2024-09-24 DIAGNOSIS — E1142 Type 2 diabetes mellitus with diabetic polyneuropathy: Secondary | ICD-10-CM

## 2024-09-24 DIAGNOSIS — E785 Hyperlipidemia, unspecified: Secondary | ICD-10-CM | POA: Diagnosis not present

## 2024-09-24 DIAGNOSIS — Z7984 Long term (current) use of oral hypoglycemic drugs: Secondary | ICD-10-CM

## 2024-09-24 DIAGNOSIS — E042 Nontoxic multinodular goiter: Secondary | ICD-10-CM | POA: Diagnosis not present

## 2024-09-24 DIAGNOSIS — Z794 Long term (current) use of insulin: Secondary | ICD-10-CM | POA: Diagnosis not present

## 2024-09-24 NOTE — Progress Notes (Signed)
 Patient ID: [name], female   DOB: [birth year], 63 y.o.   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 Last visit 4 months ago.  Interim hx: No increased urination, nausea, chest pain.   She feels well, without complaints today.  She did have some dietary indiscretions recently (candy), and had some higher blood sugars.  She also has been forgetting Trulicity .  Sugars have been slightly higher.  DM2: Reviewed HbA1c levels: 09/15/2024: HbA1c 6.0% Lab Results  Component Value Date   HGBA1C 5.5 05/24/2024   HGBA1C 5.7 (A) 09/24/2023   HGBA1C 5.6 05/21/2023   HGBA1C 5.6 09/24/2022   HGBA1C 6.3 (A) 05/22/2022   HGBA1C 6.8 (A) 01/08/2022   HGBA1C 7.0 (A) 12/28/2020   HGBA1C 6.8 (A) 04/20/2020   HGBA1C 7.3 (A) 12/15/2019   HGBA1C 6.4 (A) 08/09/2019   HGBA1C 6.7 (A) 12/16/2018   HGBA1C 7.2 (A) 08/12/2018   HGBA1C 6.6 (A) 04/10/2018   HGBA1C 6.8 10/28/2017   HGBA1C 6.2 07/29/2017   HGBA1C 6.4 04/23/2017   HGBA1C 9.3 12/12/2016   HGBA1C 11.3 09/10/2016   HGBA1C 13.3 05/07/2016   HGBA1C 7.9 (H) 12/19/2015  01/15/2024: HbA1c 6.0% 01/02/2023: HbA1c 6.0% 11/09/2021: HbA1c 6.8% 08/31/2021: HbA1c 6.3% 04/25/2021: HbA1c 7.2% 01/14/2020: HbA1c 6.7%  She is on: - Metformin  1000 mg 2x a day with meals. - Jardiance  25 mg daily before b'fast - Trulicity  1.5 >> 3 mg weekly - off for 3 weeks >> restarted 6 weeks ago (before this, out for 2 months) -  50 units in a.m. And 70 units at bedtime >> ... Lantus  >> 30 units at night only >> in am -  15-25 units before meals >> FiAsp  5-7 units 3x day  Pt checks her sugars 4 times a day with her CGM (with receiver):  Prev.:  Previously:   Lowest sugar was 50s >> 60s > 50s >> 70s; she has hypoglycemia awareness in the 90s. Highest sugar was 200 >> 200s.  Glucometer:  One Touch ultra  Pt's meals are: - Breakfast: protein shakes (6g carbs); McDonads >> usually skips - Lunch: may skip; salads cheeseburger; school cafeteria food >> may skip - Dinner: fast food; porc chops + collard greens + salad; hamburger; McNuggets - Snacks: sometimes; sweets, nuts, chips She does not have organized mealtimes.  -Mild CKD, last BUN/creatinine:  01/15/2024: 20/1.05, GFR 59, glucose 76, ACR 33 Lab Results  Component Value Date   BUN 25 12/16/2018   BUN 20 10/15/2017   CREATININE 1.09 (H) 12/16/2018   CREATININE 0.92 10/15/2017   No results found for: MICRALBCREAT On Losartan .  -+ HL. Last set of lipids: 01/15/2024: 108/118/43/45 Lab Results  Component Value Date   CHOL 98 12/16/2018   HDL 29.30 (L) 12/16/2018   LDLCALC 44 12/16/2018   TRIG 120.0 12/16/2018   CHOLHDL 3 12/16/2018  On Crestor  10.  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 edema;  Previous eye exam in 10/2021: papilledema >> sent to ED directly >> CT head, then MRI/MRV, spinal tap, temporal aa. Bx (was on 60 mg Prednisone x3 weeks).  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 She is a glaucoma suspect. 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  - + Numbness, tingling, and also burning in her feet.  She continues on Lyrica , B complex, alpha lipoic  acid.  Last foot exam 05/24/2024.  She also has HTN.  In the past, she lost more than 100 pounds.  Thyroid  nodules:  Reviewed her imaging reports and biopsies: Thyroid  ultrasound (01/03/2016): Dominant nodule is a right mid lobe hypoechoic nodule of 2.1 x 0.8 x 1.2 cm, and previously measured up to 2.3 cm. There is one left thyroid  nodule measuring 6 mm and several other tiny scattered nodules.  She had a benign biopsy of the dominant nodule.  Thyroid  ultrasound (04/29/2017): Previously biopsied right nodule is unchanged.  She has 2 additional subcentimeter thyroid  nodules, that did not require follow-up.  Also, a hypoechoic  lymph node on the right side of the neck, 1.9 cm x 0.5 cm.  Neck CT (11/19/2017): Thyroid : Small thyroid  nodules as seen at ultrasound. No worrisome finding by CT. Largest nodule on the right measures 1.7 cm. Lymph nodes: No enlarged or low-density nodes on either side of the neck. Symmetric normal appearing lymph nodes by size criteria.  She had another thyroid  U/S 10/31/2020.  After this, she was advised that one of the thyroid  nodules need to be biopsied.  I was able to review the report in care everywhere: ISTHMUS:  - Size: 0.3 cm.   RIGHT LOBE:  - Size: 4.6 x 1.9 x 1.6 cm.  - Echogenicity: Heterogeneous.   LEFT LOBE:  - Size: 2.7 x 1.7 x 1.6 cm.  - Echogenicity: Heterogeneous.   NODULES:   Nodule 1:  -- Size: 1.8 x 1 x 1.3 cm (long x AP x trans).  -- Location: Right lower.  -- Composition: solid or almost completely solid (2)  -- Echogenicity: very hypoechoic (3)  -- Shape: wider-than-tall (0)  -- Margins: smooth (0)  -- Echogenic foci: punctate echogenic foci (3)  -- ACR TI-RADS total points and risk category: 8 Points - TR5.   Nodule 2:  -- Size: 0.9 x 0.7 x 1 cm (long x AP x trans).  -- Location: Right lower.  -- Composition: solid or almost completely solid (2)  -- Echogenicity: hypoechoic (2)  -- Shape: wider-than-tall (0)  -- Margins: smooth (0)  -- Echogenic foci: none (0)  -- ACR TI-RADS total points and risk category: 4 Points - TR4.   Nodule 3:  -- Size: 0.7 x 0.5 x 0.7 cm (long x AP x trans).  -- Location: Right lower.  -- Composition: cystic or almost completely cystic (0)  -- Echogenicity: anechoic (0)  -- Shape: wider-than-tall (0)  -- Margins: smooth (0)  -- Echogenic foci: peripheral calcifications (2)  -- ACR TI-RADS total points and risk category: 2 Points - TR2.   Nodule 4:  -- Size: 0.9 x 0.7 x 0.7 cm (long x AP x trans).  -- Location: Left lower.  -- Composition: solid or almost completely solid (2)  -- Echogenicity: hypoechoic (2)  --  Shape: wider-than-tall (0)  -- Margins: smooth (0)  -- Echogenic foci: peripheral calcifications (2)  -- ACR TI-RADS total points and risk category: 6 Points - TR4.   MISCELLANEOUS:  There are other scattered less than 5 mm nodules or cysts throughout both lobes.  Thyroid  U/S (01/14/2022): Parenchymal Echotexture: Moderately heterogenous  Isthmus: 0.4 cm  Right lobe: 5.3 x 1.7 x 1.8 cm  Left lobe: 4.2 x 1.5 x 1.6 cm  _________________________________________________   Previously described nodule in the thyroid  isthmus is less conspicuous on today's exam.   Nodule labeled 1 (previously 2) is a solid nodule in the right thyroid  lobe that measures  2.2 x 1.1 x 0.9 cm previously 2.1 x 1.3 x 0.8 cm in June 2018. It was previously biopsied, and remains essentially similar in size and morphology.   Nodule labeled 2 (previously 3) is a small subcentimeter nodule more inferior right thyroid  lobe that measures 0.8 x 0.6 x 0.5 cm, previously measuring between 0.5-0.7 cm. It was also previously biopsied, and remains essentially similar in size and morphology.   Nodule labeled 3 is a solid hypoechoic with punctate echogenic foci TR 5 nodule in the inferior left thyroid  lobe that measures 0.8 x 0.8 x 0.7 cm, previously measuring up to 0.6 cm in June 2018.       IMPRESSION: 1. Multinodular thyroid  gland. 2. Nodules labeled 1 and 2 in the right thyroid  lobe has been previously biopsied and remain essentially unchanged since 2018. Correlate with biopsy results. 3. Nodule labeled 3 demonstrates slow growth with new microcalcifications that are more evident on today's exam when compared to 2018. Although this nodule could be benign given minimal change in size over 5 year span, biopsy is recommended due its change in appearance as described.   FNA (01/31/2022): Clinical History: Solid hypoechoic with punctate echogenic foci TR-5  nodule in the inferior left thyroid  lobe that measure 0.8 x  0.8 x 0.7cm  previously measuring up to 0.6cm in June 2018  Specimen Submitted:  A. THYROID , LEFT INFERIOR, FINE NEEDLE ASPIRATION:   FINAL MICROSCOPIC DIAGNOSIS:  - Consistent with benign follicular nodule (Bethesda category II)   SPECIMEN ADEQUACY:  Satisfactory for evaluation   Pt denies: - feeling nodules in neck - hoarseness - dysphagia - choking  Review latest TFTs: 01/15/2024: TSH 1.10 Lab Results  Component Value Date   TSH 1.69 12/16/2018   She has thyroid  eye disease. She does not have a history of Hashimoto's thyroiditis or Graves' disease.  She retired from a high stress job in 05/2021.  She greatly enjoys retirement.  ROS: + see HPI  I reviewed pt's medications, allergies, PMH, social hx, family hx, and changes were documented in the history of present illness. Otherwise, unchanged from my initial visit note.  Past Medical History:  Diagnosis Date   Allergy    Zyrtec daily.   Anemia    as a child only   Arthritis    DDD lumbar spine   Asthma    Albuterol  seasonally.   Chronic kidney disease    III A- CFR 53   Diabetes mellitus without complication (HCC)    Glaucoma    Heart murmur    since birth   Hyperlipidemia    Hypertension    Neuromuscular disorder (HCC)    R third nerve palsy   OSA (obstructive sleep apnea) 11/11/2006   no CPAP   Seizures (HCC)    Thyroid  disease    Thyroid  nodules s/p needle biopsy negative.    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  Loves work.      Tobacco: 1/2 pdd x 20 years.      Alcohol:  None      Drugs: none       Education: [employer].       Exercise: No.      Seatbelt: 100%      Guns: unloaded.      Sexually active: none; total partners = 5.  Last STD screen 2006.     Current Outpatient Medications on File Prior to Visit  Medication Sig Dispense Refill   acetaZOLAMIDE (DIAMOX) 250 MG tablet Take 750 mg by mouth.     amLODipine (NORVASC) 5 MG tablet Take 5 mg by mouth daily.     BD INSULIN  SYRINGE U/F 31G X 5/16 1 ML MISC USE AS DIRECTED TO INJECT INSULIN  TWO TIMES DAILY 100 each 0   Cholecalciferol (VITAMIN D3 PO) Take by mouth. 5000 IU daily     Continuous Blood Gluc Receiver (FREESTYLE LIBRE 2 READER) DEVI USE DAILY TO MONITOR BLOOD GLUCOSE 6 each 3   Continuous Glucose Sensor (FREESTYLE LIBRE 2 SENSOR) MISC APPLY 1 SENSOR TO SKIN FOR CONTINUOUS BLOOD SUGAR MONITORING, REPLACE EVERY 14 DAYS 6 each 3   Dulaglutide  (TRULICITY ) 3 MG/0.5ML SOAJ INJECT 3 MG UNDER THE SKIN ONCE WEEKLY 2 mL 4   empagliflozin  (JARDIANCE ) 25 MG TABS tablet Take 1 tablet (25 mg total) by mouth daily. 90 tablet 3   Finerenone (KERENDIA) 10 MG TABS Take 1 tablet every day by oral route.     hydrochlorothiazide (HYDRODIURIL) 12.5 MG tablet Take 12.5 mg by mouth daily.     insulin  aspart (FIASP  FLEXTOUCH) 100 UNIT/ML FlexTouch Pen Inject 0-7 Units into the skin 2 (two) times daily before a meal.     insulin  glargine (LANTUS  SOLOSTAR) 100 UNIT/ML Solostar Pen INJECT 30 UNITS UNDER THE SKIN 2 TIMES A DAY 60 mL 2   Insulin  Pen Needle (BD PEN NEEDLE NANO 2ND GEN) 32G X 4 MM MISC USE 3 TIMES A DAY FOR INSULIN  300 each 3    levocetirizine (XYZAL) 5 MG tablet Take 5 mg by mouth every evening.     losartan  (COZAAR ) 100 MG tablet Take 100 mg by mouth daily.     metFORMIN  (GLUCOPHAGE ) 1000 MG tablet TAKE 1 TABLET BY MOUTH 2 TIMES A DAY 180 tablet 3   Multiple Vitamins-Minerals (MULTIVITAMIN WITH MINERALS) tablet Take 1 tablet by mouth daily.     ONETOUCH ULTRA test strip TEST 3-4 TIMES DAILY 300 strip 2   pregabalin  (LYRICA ) 25 MG capsule Take 1-2 capsules (25-50 mg total) by mouth at bedtime. 60 capsule 5   rosuvastatin  (CRESTOR ) 10 MG tablet TAKE ONE TABLET BY MOUTH DAILY 30 tablet 0   SYMBICORT 160-4.5 MCG/ACT inhaler Inhale into the lungs.     Syringe/Needle, Disp, (SYRINGE 3CC/27GX1-1/4) 27G X 1-1/4 3 ML MISC Use as instructed to inject insulin  100 each 0   vitamin B-12 (CYANOCOBALAMIN) 500 MCG tablet Take 500 mcg by mouth daily.     No current facility-administered medications on  file prior to visit.   Allergies  Allergen Reactions   Covid-19 Mrna Vaccine Proofreader) [Covid-19 Mrna Vacc (Moderna)] Other (See Comments)   Family History  Problem Relation Age of Onset   Diabetes Mother    Hypertension Mother    Ovarian cancer Mother 43   Breast cancer Mother 59   Cancer Mother 74       ovarian cancer age 86; breast cancer age 13   Diabetes Father    Heart disease Father 61       CAD/CABG; AAA; CHF   Hyperlipidemia Father    Hypertension Father    Prostate cancer Father        dx. 82-83   Colon polyps Father        section of colon removed; unsure of #   Skin cancer Sister    Heart disease Sister 41       3-vessel CAD s/p 1 stenting; CABG   Diabetes Sister    Hyperlipidemia Sister    Hypertension Sister    Heart disease Brother        Died of CHF at age 44.   Hyperlipidemia Brother    Hypertension Brother    Congestive Heart Failure Brother    Esophageal cancer Brother    Cancer Brother 20       GI- esophageal; smoker   Heart disease Brother        MIs in 58s and CABG.   Diabetes Brother     Hypertension Brother    Skin cancer Maternal Aunt        multiple - unknown type; dx. 49s   Pancreatic cancer Maternal Uncle        dx. 70s   Stroke Maternal Grandmother    Hypertension Maternal Grandmother    Skin cancer Maternal Grandfather        multiple - unknown types; dx. 108s   Cancer Maternal Grandfather    Heart Problems Paternal Grandfather    Crohn's disease Neg Hx    Rectal cancer Neg Hx    Stomach cancer Neg Hx    Colon cancer Neg Hx    PE: BP 120/70   Pulse 81   Ht 5' 3 (1.6 m)   Wt 180 lb 9.6 oz (81.9 kg)   SpO2 99%   BMI 31.99 kg/m  Wt Readings from Last 3 Encounters:  09/24/24 180 lb 9.6 oz (81.9 kg)  05/24/24 181 lb 6.4 oz (82.3 kg)  01/15/24 186 lb (84.4 kg)   Constitutional: overweight, in NAD Eyes:  EOMI, no exophthalmos ENT: no thyromegaly, no cervical lymphadenopathy Cardiovascular: RRR, No MRG Respiratory: CTA B Musculoskeletal: no deformities Skin: no rashes Neurological: no tremor with outstretched hands  ASSESSMENT: 1. DM2, insulin -dependent, now controlled, with complications - Peripheral neuropathy.  2. MNG  3. HL  PLAN:  1. Patient with longstanding, well controlled DM2, on a complex med regimen with metformin , SGLT2 inhibitor, GLP-1 receptor agonist and basal-bolus insulin , with good control.  At last visit, since the sugars were all at goal, we discussed about only taking Fiasp  before larger meals. CGM interpretation -At today's visit, we reviewed her CGM downloads: It appears that 96% of values are in target range (goal >70%), while 4% are higher than 180 (goal <25%), and 0% are lower than 70 (goal <4%).  The calculated average blood sugar is 130.  The projected HbA1c for the next 3 months (GMI) is 6.2%. -Reviewing the CGM trends, sugars appear to be fluctuating still within the  target range, but with more variability than at last visit.  She does have occasional hyperglycemic excursions particularly after breakfast and dinner.   She does mention more dietary indiscretions lately -more sweets.  She is using Fiasp  more consistently now, Pete for all meals, particularly as she was also forgetting to take Trulicity  weekly.  She does have alarms on her phone and is planning to work on compliance with the GLP-1 receptor agonist.  I did not suggest any other changes in her regimen especially as her most recent HbA1c was 6.0% obtained 9 days ago. - I suggested to:  Patient Instructions  Please continue: - Metformin  1000 mg 2x a day with meals. - Jardiance  25 mg daily before b'fast - Trulicity  3 mcg weekly - try not to forget this - Lantus  20 units in am - Fiasp  5-7 units before meals   Please return in 4 months.  - advised to check sugars at different times of the day - 4x a day, rotating check times - advised for yearly eye exams >> she is UTD - return to clinic in 4 months  2. MNG -Reviewed her thyroid  ultrasound report from 2019: Stable right dominant nodule, which was previously biopsied with benign results. She had a neck CT afterwards which did not show any worrisome nodules in her neck.. She saw her PCP in 10/2020, and at that time the nodules were felt to be enlarged.  Another thyroid  ultrasound was checked (I do not have the images, only the report) and this showed mostly subcentimeter nodules while the dominant right nodule was 1.8 cm in the largest dimension.  This nodule was biopsied with benign results in the past and, per review of records, this is actually decreased in size on the latest ultrasound.  Therefore, I did not suggest any biopsy at that time. Latest thyroid  ultrasound is from 01/2022:  2 of the dominant nodules were stable (these were previously biopsied with benign results), but the left inferior nodule was slightly larger and had punctate echogenic foci.  We biopsied the nodule with benign results - Neck compression symptoms - TSH was normal in 01/2024 - Continue to keep an eye on her thyroid   nodules  3. HL - Latest lipid panel was at goal in 01/15/2024 except for slightly low HDL: 108/118/43/45 - Continues on Crestor  10 mg daily without side effects  Lela Fendt, MD PhD [facility]

## 2024-09-24 NOTE — Patient Instructions (Addendum)
 Please continue: - Metformin  1000 mg 2x a day with meals. - Jardiance  25 mg daily before b'fast - Trulicity  3 mcg weekly - try not to forget this - Lantus  20 units in am - Fiasp  5-7 units before meals   Please return in 4 months.

## 2024-10-14 ENCOUNTER — Other Ambulatory Visit: Payer: Self-pay | Admitting: Internal Medicine

## 2024-10-17 ENCOUNTER — Other Ambulatory Visit: Payer: Self-pay | Admitting: Internal Medicine

## 2024-10-17 DIAGNOSIS — E1142 Type 2 diabetes mellitus with diabetic polyneuropathy: Secondary | ICD-10-CM

## 2024-10-19 ENCOUNTER — Other Ambulatory Visit (HOSPITAL_BASED_OUTPATIENT_CLINIC_OR_DEPARTMENT_OTHER): Payer: Self-pay

## 2024-11-09 ENCOUNTER — Telehealth: Payer: Self-pay | Admitting: Pharmacy Technician

## 2024-11-09 ENCOUNTER — Other Ambulatory Visit (HOSPITAL_COMMUNITY): Payer: Self-pay

## 2024-11-09 NOTE — Telephone Encounter (Signed)
 Pharmacy Patient Advocate Encounter   Received notification from Onbase that prior authorization for Fiasp  FlexTouch 100UNIT/ML pen-injectors is required/requested.   Insurance verification completed.   The patient is insured through [facility] ADVANTAGE/RX ADVANCE. Archived key: [id number]   Per test claim: The current 85 day co-pay is, $0.00.  No PA needed at this time. This test claim was processed through [facility]- copay amounts may vary at other pharmacies due to pharmacy/plan contracts, or as the patient moves through the different stages of their insurance plan.

## 2024-11-10 ENCOUNTER — Other Ambulatory Visit: Payer: Self-pay | Admitting: Internal Medicine

## 2024-11-10 DIAGNOSIS — E1142 Type 2 diabetes mellitus with diabetic polyneuropathy: Secondary | ICD-10-CM

## 2025-02-01 ENCOUNTER — Ambulatory Visit: Admitting: Internal Medicine

## 2025-02-04 ENCOUNTER — Ambulatory Visit: Admitting: Internal Medicine
